# Patient Record
Sex: Female | Born: 1945
Health system: Southern US, Community
[De-identification: ages and names within clinical notes are randomized; demographics above are authoritative.]

## PROBLEM LIST (undated history)

## (undated) DIAGNOSIS — Z86010 Personal history of colonic polyps: Secondary | ICD-10-CM

## (undated) DIAGNOSIS — Z87442 Personal history of urinary calculi: Secondary | ICD-10-CM

## (undated) DIAGNOSIS — E785 Hyperlipidemia, unspecified: Secondary | ICD-10-CM

## (undated) HISTORY — DX: Personal history of colonic polyps: Z86.010

## (undated) HISTORY — DX: Hyperlipidemia, unspecified: E78.5

## (undated) HISTORY — DX: Personal history of urinary calculi: Z87.442

## (undated) HISTORY — PX: CATARACT EXTRACTION, BILATERAL: SHX1313

---

## 2003-08-11 ENCOUNTER — Other Ambulatory Visit: Admission: RE | Admit: 2003-08-11 | Discharge: 2003-08-11 | Payer: Self-pay | Admitting: Obstetrics & Gynecology

## 2007-04-05 ENCOUNTER — Encounter: Payer: Self-pay | Admitting: Internal Medicine

## 2007-05-31 ENCOUNTER — Ambulatory Visit: Payer: Self-pay | Admitting: Internal Medicine

## 2007-05-31 DIAGNOSIS — E785 Hyperlipidemia, unspecified: Secondary | ICD-10-CM

## 2007-05-31 DIAGNOSIS — Z87442 Personal history of urinary calculi: Secondary | ICD-10-CM

## 2007-05-31 DIAGNOSIS — M8580 Other specified disorders of bone density and structure, unspecified site: Secondary | ICD-10-CM

## 2007-05-31 HISTORY — DX: Personal history of urinary calculi: Z87.442

## 2007-05-31 HISTORY — DX: Hyperlipidemia, unspecified: E78.5

## 2007-05-31 LAB — CONVERTED CEMR LAB: Vit D, 1,25-Dihydroxy: 45 (ref 20–57)

## 2007-06-02 LAB — CONVERTED CEMR LAB: CRP, High Sensitivity: <1 — ABNORMAL LOW (ref 0.00–5.00)

## 2007-06-07 ENCOUNTER — Encounter: Payer: Self-pay | Admitting: Internal Medicine

## 2007-06-16 ENCOUNTER — Ambulatory Visit: Payer: Self-pay | Admitting: Internal Medicine

## 2007-06-30 ENCOUNTER — Encounter: Payer: Self-pay | Admitting: Internal Medicine

## 2007-06-30 ENCOUNTER — Ambulatory Visit: Payer: Self-pay | Admitting: Internal Medicine

## 2007-06-30 ENCOUNTER — Other Ambulatory Visit: Admission: RE | Admit: 2007-06-30 | Discharge: 2007-06-30 | Payer: Self-pay | Admitting: Internal Medicine

## 2007-06-30 DIAGNOSIS — G4700 Insomnia, unspecified: Secondary | ICD-10-CM

## 2008-05-22 ENCOUNTER — Encounter: Payer: Self-pay | Admitting: Internal Medicine

## 2008-09-18 ENCOUNTER — Ambulatory Visit: Payer: Self-pay | Admitting: Internal Medicine

## 2008-09-18 LAB — CONVERTED CEMR LAB
ALT: 15 units/L (ref 0–35)
Alkaline Phosphatase: 73 units/L (ref 39–117)
BUN: 15 mg/dL (ref 6–23)
Basophils Relative: 0.3 % (ref 0.0–3.0)
Bilirubin, Direct: 0.1 mg/dL (ref 0.0–0.3)
CO2: 30 meq/L (ref 19–32)
Calcium: 9.6 mg/dL (ref 8.4–10.5)
Eosinophils Relative: 1.7 % (ref 0.0–5.0)
Glucose, Bld: 93 mg/dL (ref 70–99)
Hemoglobin: 13.3 g/dL (ref 12.0–15.0)
Lymphocytes Relative: 31.2 % (ref 12.0–46.0)
Monocytes Relative: 12.1 % — ABNORMAL HIGH (ref 3.0–12.0)
Neutro Abs: 2 10*3/uL (ref 1.4–7.7)
Nitrite: NEGATIVE
RBC: 4.2 M/uL (ref 3.87–5.11)
RDW: 12.1 % (ref 11.5–14.6)
Sodium: 139 meq/L (ref 135–145)
TSH: 1.81 microintl units/mL (ref 0.35–5.50)
Total CHOL/HDL Ratio: 3.5
Total Protein: 7.2 g/dL (ref 6.0–8.3)
Urobilinogen, UA: 1
VLDL: 14 mg/dL (ref 0–40)
WBC Urine, dipstick: NEGATIVE
WBC: 3.7 10*3/uL — ABNORMAL LOW (ref 4.5–10.5)

## 2008-09-25 ENCOUNTER — Ambulatory Visit: Payer: Self-pay | Admitting: Internal Medicine

## 2009-10-12 ENCOUNTER — Ambulatory Visit: Payer: Self-pay | Admitting: Internal Medicine

## 2009-10-12 ENCOUNTER — Other Ambulatory Visit: Admission: RE | Admit: 2009-10-12 | Discharge: 2009-10-12 | Payer: Self-pay | Admitting: Internal Medicine

## 2009-10-12 DIAGNOSIS — R05 Cough: Secondary | ICD-10-CM

## 2009-10-12 DIAGNOSIS — Z860100 Personal history of colon polyps, unspecified: Secondary | ICD-10-CM

## 2009-10-12 HISTORY — DX: Personal history of colonic polyps: Z86.010

## 2009-10-12 HISTORY — DX: Personal history of colon polyps, unspecified: Z86.0100

## 2009-10-15 ENCOUNTER — Encounter: Payer: Self-pay | Admitting: Internal Medicine

## 2010-08-14 ENCOUNTER — Ambulatory Visit: Payer: Self-pay | Admitting: Internal Medicine

## 2010-10-06 LAB — CONVERTED CEMR LAB
ALT: 19 units/L (ref 0–35)
AST: 25 units/L (ref 0–37)
Albumin: 4.2 g/dL (ref 3.5–5.2)
Basophils Absolute: 0 10*3/uL (ref 0.0–0.1)
Calcium: 9.2 mg/dL (ref 8.4–10.5)
Cholesterol: 221 mg/dL — ABNORMAL HIGH (ref 0–200)
Creatinine, Ser: 0.7 mg/dL (ref 0.4–1.2)
Eosinophils Absolute: 0 10*3/uL (ref 0.0–0.7)
HCT: 38.8 % (ref 36.0–46.0)
Hemoglobin: 12.7 g/dL (ref 12.0–15.0)
Lymphs Abs: 0.9 10*3/uL (ref 0.7–4.0)
MCHC: 32.7 g/dL (ref 30.0–36.0)
Neutro Abs: 3.4 10*3/uL (ref 1.4–7.7)
RDW: 11.8 % (ref 11.5–14.6)
TSH: 1.23 microintl units/mL (ref 0.35–5.50)
Triglycerides: 48 mg/dL (ref 0.0–149.0)

## 2010-10-08 NOTE — Assessment & Plan Note (Signed)
Summary: flu shot/cjr  Nurse Visit   Allergies: 1)  ! Sulfa  Orders Added: 1)  Flu Vaccine 28yrs + MEDICARE PATIENTS [Q2039] 2)  Administration Flu vaccine - MCR [G0008]    Flu Vaccine Consent Questions     Do you have a history of severe allergic reactions to this vaccine? no    Any prior history of allergic reactions to egg and/or gelatin? no    Do you have a sensitivity to the preservative Thimersol? no    Do you have a past history of Guillan-Barre Syndrome? no    Do you currently have an acute febrile illness? no    Have you ever had a severe reaction to latex? no    Vaccine information given and explained to patient? yes    Are you currently pregnant? no    Lot Number:[id number]   Exp Date:03/08/2011   Site Given  Left Deltoid IM Romualdo Bolk, CMA (AAMA)  August 14, 2010 9:05 AM

## 2010-10-08 NOTE — Assessment & Plan Note (Signed)
Summary: emp/pt fasting/cjr   Vital Signs:  Patient profile:   65 year old female Menstrual status:  postmenopausal Height:      65 inches Weight:      129 pounds BMI:     21.54 Pulse rate:   66 / minute BP sitting:   110 / 70  (right arm) Cuff size:   regular  Vitals Entered By: Romualdo Bolk, CMA (AAMA) (October 12, 2009 8:55 AM)  Contraindications/Deferment of Procedures/Staging:    Test/Procedure: FLU VAX    Reason for deferment: patient declined flu shot not available  and had declined earlier .,  CC: Annual visit for disease management- discuss doing a pap Menarche (age onset years): 11    Menstrual Status postmenopausal Last PAP Result normal   History of Present Illness: Nichole Berg comes in today for welcome to medicare check up.   Since last visit  here  there have been no major changes in health status  .  She currently has a cough  into the second week without fever and now loose taking ucinex. No cp or sob. grandchildren are young and exposing.    laryngitis at x mas and  improved. and then   had another uri  but week for  1 week.    No fever or sob.     looser  now.        Due for cataract   removeal   right for Feb 22.   Sleep  : rare use of ambien.   LIPIDS: on no meds or supp for this now  . got se of higher dose  oRYR. not taking crestor.  No new change in fam hx.  no cv pulm signs  Disc immuniz    Preventive Care Screening  Pap Smear:    Date:  06/30/2007    Results:  normal   Mammogram:    Date:  05/23/2007    Results:  normal   Prior Values:    Colonoscopy:  Polyps-benign (09/08/2004)    Last Tetanus Booster:  Historical (09/09/2003)   Preventive Screening-Counseling & Management  Alcohol-Tobacco     Alcohol drinks/day: 1     Alcohol type: Beer or Wine     Smoking Status: never     Year Quit: ages 23 and 48  Caffeine-Diet-Exercise     Caffeine use/day: 0     Does Patient Exercise: yes     Type of exercise: [employer], yoga, golf,  walking     Times/week: 7  Hep-HIV-STD-Contraception     Dental Visit-last 6 months yes     Sun Exposure-Excessive: no  Safety-Violence-Falls     Seat Belt Use: 100     Firearms in the Home: no firearms in the home     Smoke Detectors: yes     Fall Risk: normal       Blood Transfusions:  no.    Current Medications (verified): 1)  Multi-Vitamin   Tabs (Multiple Vitamin) 2)  Calcium 500/d 500-200 Mg-Unit  Tabs (Calcium Carbonate-Vitamin D) 3)  Fish Oil   Oil (Fish Oil) 4)  Ambien 10 Mg  Tabs (Zolpidem Tartrate) .Marland Kitchen.. 1po Hs As Needed 5)  Vitamin D 2000 Unit Tabs (Cholecalciferol) 6)  Astragalus Root  Powd (Tragacanth) 7)  Culturelle 10 B Cell Caps (Lactobacillus Rhamnosus (Gg))  Allergies (verified): 1)  ! Sulfa  Past History:  Past medical, surgical, family and social histories (including risk factors) reviewed, and no changes noted (except as noted below).  Past Medical History: elevated cholesterol anemia premenopausal. Nephrolithiasis, hx of  in her 33s Catacters G2 P2 colonoscopy with small polyps 2005 Dr. Kinnie Scales        LAST Mammogram: 6/09 Pap: 2009 Td: 2005   Colonscopy: 2005   [facility]. EKG: 2005 Dexa: 2006 Eye Exam: 2008   Other: Smoking: Never  Consults: None  Past Surgical History: Reviewed history from 05/31/2007 and no changes required. Kidney Stone removed 1969  Past History:  Care Management: Gastroenterology: [facility] Ophthalmology: [facility]  Family History: Reviewed history from 09/25/2008 and no changes required. Family History of CAD Female 1st degree rel   father had a stent in his 6's.  herHiparents are 69 and 62 Father: Stent, spinal stenosis, J-Tube Mother: CHF, HBP, back pain Siblings: 1 Brother and 1 sister HBP    Social History: Reviewed history from 09/25/2008 and no changes required. 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    Caffeine use/day:  0 Dental Care w/in 6 mos.:  yes Sun Exposure-Excessive:  no Blood Transfusions:  no Fall Risk:  normal   Review of Systems  The patient denies anorexia, fever, weight loss, weight gain, vision loss, decreased hearing, hoarseness, chest pain, syncope, dyspnea on exertion, peripheral edema, prolonged cough, headaches, hemoptysis, abdominal pain, melena, hematochezia, severe indigestion/heartburn, hematuria, incontinence, genital sores, muscle weakness, suspicious skin lesions, transient blindness, difficulty walking, depression, unusual weight change, abnormal bleeding, enlarged lymph nodes, angioedema, and breast masses.   Physical Exam General Appearance: well developed, well nourished, no acute distress Eyes: conjunctiva and lids normal, PERRLA, EOMI,  WNL Ears, Nose, Mouth, Throat: TM clear, nares clear, oral exam WNL Neck: supple, no lymphadenopathy, no thyromegaly, no JVD Respiratory: clear to auscultation and percussion, respiratory effort normal Cardiovascular: regular rate and rhythm, S1-S2, no murmur, rub or gallop, no bruits, peripheral pulses normal and symmetric, no cyanosis, clubbing, edema or varicosities Chest: no scars, masses, tenderness; no asymmetry, skin changes, nipple discharge   Gastrointestinal: soft, non-tender; no hepatosplenomegaly, masses; active bowel sounds all quadrants, guaiac negative stool; no masses, tenderness,  slight tag of hemorrhoids  Genitourinary: no vaginal discharge, lesions; no masses or tenderness  PAP done  Lymphatic: no cervical, axillary or inguinal adenopathy Musculoskeletal: gait normal, muscle tone and strength WNL, no joint swelling, effusions, discoloration, crepitus  Skin: clear, good turgor, color WNL, no rashes, lesions, or ulcerations  but does have  sun changes  Neurologic: normal mental status, normal reflexes, normal strength, sensation, and motion Psychiatric: alert; oriented to person, place and time Other  Exam:  EKG NSR  rate 54      Impression & Recommendations:  Problem # 1:  PREVENTIVE HEALTH CARE (ICD-V70.0) Discussed nutrition,exercise,diet,healthy weight, vitamin D and calcium.  continue healthy lifestyle intervention  rec tdap with exposure to grand children and pneumovax for age.    disc zostavax and will wait on thisfor now Orders: EKG w/ Interpretation (93000) Venipuncture (13086)  Problem # 2:  GYNECOLOGICAL EXAMINATION, ROUTINE (ICD-V72.31) pap done  Orders: Obtaining Screening PAP Smear (V7846) Pelvic & Breast Exam ( Medicare)  (N6295)  Problem # 3:  COUGH (ICD-786.2) probably viral  cause  .  call if persistent and progressive   Orders: TLB-CBC Platelet - w/Differential (85025-CBCD) Venipuncture (28413)  Problem # 4:  HYPERLIPIDEMIA (ICD-272.4) se of statin meds?      also  risk is not that high except for age  and levels  had improved  in the past her crp was ok.  she does have a family hc of vascular disease but  in later ages  68s.    disc optinos and she can look at reynolds rsik score when avaialble.  if no intervention then not worth doing the  subparticle analysis at this time. The following medications were removed from the medication list:    Crestor 5 Mg Tabs (Rosuvastatin calcium)  Orders: TLB-BMP (Basic Metabolic Panel-BMET) (80048-METABOL) TLB-Lipid Panel (80061-LIPID) TLB-Hepatic/Liver Function Pnl (80076-HEPATIC) TLB-TSH (Thyroid Stimulating Hormone) (84443-TSH) EKG w/ Interpretation (93000)  Problem # 5:  OSTEOPENIA (ICD-733.90)  Her updated medication list for this problem includes:    Calcium 500/d 500-200 Mg-unit Tabs (Calcium carbonate-vitamin d)    Vitamin D 2000 Unit Tabs (Cholecalciferol)  Orders: TLB-BMP (Basic Metabolic Panel-BMET) (80048-METABOL) TLB-Lipid Panel (80061-LIPID) TLB-Hepatic/Liver Function Pnl (80076-HEPATIC) TLB-TSH (Thyroid Stimulating Hormone) (84443-TSH) Venipuncture (09811)  Problem # 6:  SYMPTOM, INSOMNIA NOS  (ICD-780.52) Assessment: Unchanged stable ocass use of med  Her updated medication list for this problem includes:    Ambien 10 Mg Tabs (Zolpidem tartrate) .Marland Kitchen... 1po hs as needed  Problem # 7:  hx of colony polyp stool cards and follow up with dr Kinnie Scales as he recs.   Complete Medication List: 1)  Multi-vitamin Tabs (Multiple vitamin) 2)  Calcium 500/d 500-200 Mg-unit Tabs (Calcium carbonate-vitamin d) 3)  Fish Oil Oil (Fish oil) 4)  Ambien 10 Mg Tabs (Zolpidem tartrate) .Marland Kitchen.. 1po hs as needed 5)  Vitamin D 2000 Unit Tabs (Cholecalciferol) 6)  Astragalus Root Powd (Tragacanth) 7)  Culturelle 10 B Cell Caps (Lactobacillus rhamnosus (gg))  Other Orders: Pneumococcal Vaccine (91478) Admin 1st Vaccine (29562) Tdap => 9yrs IM (13086) Admin of Any Addtl Vaccine (57846)  Patient Instructions: 1)  You will be informed of lab results when available.  2)  continue lifestyle intervention  and rov in 1 year or if needed. 3)  Stool hemoccults .  Prescriptions: AMBIEN 10 MG  TABS (ZOLPIDEM TARTRATE) 1po hs as needed  #20 x 1   Entered and Authorized by:   Madelin Headings MD   Signed by:   Madelin Headings MD on 10/12/2009   Method used:   Print then Give to Patient   RxID:   [id number]    Immunizations Administered:  Pneumonia Vaccine:    Vaccine Type: Pneumovax (Medicare)    Site: right deltoid    Mfr: Merck    Dose: 0.5 ml    Route: IM    Given by: Romualdo Bolk, CMA (AAMA)    Exp. Date: 12/27/2010    Lot #: [id number]  Tetanus Vaccine:    Vaccine Type: Tdap    Site: left deltoid    Mfr: GlaxoSmithKline    Dose: 0.5 ml    Route: IM    Given by: Romualdo Bolk, CMA (AAMA)    Exp. Date: 11/03/2011    Lot #: [id number]

## 2011-01-20 ENCOUNTER — Ambulatory Visit: Payer: Self-pay | Admitting: Internal Medicine

## 2011-01-20 DIAGNOSIS — Z0289 Encounter for other administrative examinations: Secondary | ICD-10-CM

## 2011-01-21 ENCOUNTER — Telehealth: Payer: Self-pay | Admitting: Internal Medicine

## 2011-01-21 NOTE — Telephone Encounter (Signed)
Pt read on internet that after receiving the shingles vaccine, there is a period of time to avoid children who have not had chicken pox or the vaccine, and anyone that hasn't had chicken pox or vaccine and immune compromised individuals.  It did not say how long, and she has grandchildren that she sees weekly.  How long would Dr. Fabian Sharp recommend avoiding them?

## 2011-01-21 NOTE — Telephone Encounter (Signed)
Pt called and has questions re: shinges vaccine and being around children that haven't had chicken pox or the vaccine? Pt was suppose to have vax on 01/20/11, but missed appt and would like to know how soon after rcvng vax, she could be around children?

## 2011-01-23 ENCOUNTER — Telehealth: Payer: Self-pay | Admitting: *Deleted

## 2011-01-23 NOTE — Telephone Encounter (Signed)
Pt given recommendation from Dr. Fabian Sharp re :  Exposure after shingles vaccine.

## 2011-01-23 NOTE — Telephone Encounter (Signed)
LMTCB

## 2011-01-23 NOTE — Telephone Encounter (Signed)
There is no recommendation to avoid contact with others after vaccine. Transmission would be very rare and associated with those people getting the side effect of a rash from the vaccine (which is also pretty uncommon.)

## 2011-01-23 NOTE — Telephone Encounter (Signed)
Pt was given recommendations from Dr. Fabian Sharp re: shingles vaccine question.

## 2011-05-09 ENCOUNTER — Encounter: Payer: Self-pay | Admitting: Internal Medicine

## 2011-05-09 ENCOUNTER — Ambulatory Visit (INDEPENDENT_AMBULATORY_CARE_PROVIDER_SITE_OTHER): Payer: BC Managed Care – PPO | Admitting: Internal Medicine

## 2011-05-09 VITALS — BP 120/80 | HR 60 | Temp 98.7°F | Wt 136.0 lb

## 2011-05-09 DIAGNOSIS — J069 Acute upper respiratory infection, unspecified: Secondary | ICD-10-CM

## 2011-05-09 DIAGNOSIS — H6690 Otitis media, unspecified, unspecified ear: Secondary | ICD-10-CM

## 2011-05-09 DIAGNOSIS — H6691 Otitis media, unspecified, right ear: Secondary | ICD-10-CM | POA: Insufficient documentation

## 2011-05-09 MED ORDER — AMOXICILLIN 500 MG PO CAPS: 500.0000 mg | ORAL_CAPSULE | Freq: Three times a day (TID) | ORAL | Status: AC

## 2011-05-09 NOTE — Progress Notes (Signed)
  Subjective:    Patient ID: Nichole Berg, female    DOB: 03-Apr-1946, 65 y.o.   MRN: [id number]  HPI Come sin for acute problem   Onset: after week of laryngitis and then onset aching  Right ear for 4 days  No fever   [place] Peers getting better   Better with advil  Intensity: achy off an on  Pattern waxing and waning  No fever    Treatment tried ibuprofen.  No other.  Taking care of 2 yer old grandchild had ear infection  Review of Systems Mild cough no fever  No hx of Om or sinus pain.  No gi  gu issues   Past history family history social history reviewed in the electronic medical record.     Objective:   Physical Exam  WDWN in nad  Mild ly hoarse . HEENT: Normocephalic ;atraumatic , Eyes;  PERRL, EOMs  Full, lids and conjunctiva clear,,Ears: no deformities, canals nl, TM landmarks normal left  Right  Pink and full dusky tm right non bulging., Nose: no deformity or discharge  Mouth : OP clear without lesion or edema . Chest:  Clear to A&P without wheezes rales or rhonchi CV:  S1-S2 no gallops or murmurs peripheral perfusion is normal Skin turgor .  Nl no rashes    Assessment & Plan:  Right otitis media subacute with URI   Treatment options discussed. Can add 5 day  Antibiotic if appropriate.   amox

## 2011-05-09 NOTE — Patient Instructions (Signed)
Can add antibiotic if not getting better . Call if get fever or severe pain.

## 2011-05-14 ENCOUNTER — Telehealth: Payer: Self-pay | Admitting: Internal Medicine

## 2011-05-14 MED ORDER — ZOSTER VACCINE LIVE 19400 UNT/0.65ML ~~LOC~~ SOLR: 0.6500 mL | Freq: Once | SUBCUTANEOUS | Status: DC

## 2011-05-14 NOTE — Telephone Encounter (Signed)
Rx sent to pharmacy   

## 2011-05-14 NOTE — Telephone Encounter (Signed)
Nichole Berg, pt would like to get the Zostavax at [facility]. Pt wants to know if you can call the Rx in to the one on [address]. Please call pt to confirm.

## 2011-09-19 ENCOUNTER — Encounter: Payer: Self-pay | Admitting: Internal Medicine

## 2011-10-13 ENCOUNTER — Encounter: Payer: Self-pay | Admitting: Internal Medicine

## 2011-10-13 ENCOUNTER — Ambulatory Visit (INDEPENDENT_AMBULATORY_CARE_PROVIDER_SITE_OTHER): Payer: BC Managed Care – PPO | Admitting: Internal Medicine

## 2011-10-13 VITALS — BP 124/74 | HR 70 | Temp 97.6°F | Ht 64.5 in | Wt 134.0 lb

## 2011-10-13 DIAGNOSIS — Z0000 Encounter for general adult medical examination without abnormal findings: Secondary | ICD-10-CM | POA: Insufficient documentation

## 2011-10-13 DIAGNOSIS — E785 Hyperlipidemia, unspecified: Secondary | ICD-10-CM

## 2011-10-13 DIAGNOSIS — Z87442 Personal history of urinary calculi: Secondary | ICD-10-CM

## 2011-10-13 DIAGNOSIS — G4700 Insomnia, unspecified: Secondary | ICD-10-CM

## 2011-10-13 DIAGNOSIS — M949 Disorder of cartilage, unspecified: Secondary | ICD-10-CM

## 2011-10-13 DIAGNOSIS — Z86010 Personal history of colonic polyps: Secondary | ICD-10-CM

## 2011-10-13 DIAGNOSIS — M899 Disorder of bone, unspecified: Secondary | ICD-10-CM

## 2011-10-13 DIAGNOSIS — H269 Unspecified cataract: Secondary | ICD-10-CM

## 2011-10-13 LAB — CBC WITH DIFFERENTIAL/PLATELET
Basophils Absolute: 0 10*3/uL (ref 0.0–0.1)
Eosinophils Absolute: 0 10*3/uL (ref 0.0–0.7)
Lymphocytes Relative: 24.2 % (ref 12.0–46.0)
MCHC: 33.9 g/dL (ref 30.0–36.0)
Monocytes Relative: 8.5 % (ref 3.0–12.0)
Neutrophils Relative %: 66 % (ref 43.0–77.0)
Platelets: 217 10*3/uL (ref 150.0–400.0)
RDW: 13.6 % (ref 11.5–14.6)

## 2011-10-13 LAB — HEPATIC FUNCTION PANEL
AST: 26 U/L (ref 0–37)
Albumin: 4.4 g/dL (ref 3.5–5.2)
Alkaline Phosphatase: 83 U/L (ref 39–117)
Bilirubin, Direct: 0 mg/dL (ref 0.0–0.3)
Total Bilirubin: 1 mg/dL (ref 0.3–1.2)

## 2011-10-13 LAB — BASIC METABOLIC PANEL
CO2: 27 mEq/L (ref 19–32)
Calcium: 9.2 mg/dL (ref 8.4–10.5)
Creatinine, Ser: 0.7 mg/dL (ref 0.4–1.2)
GFR: 96.36 mL/min (ref >60.00)
Glucose, Bld: 91 mg/dL (ref 70–99)
Sodium: 143 mEq/L (ref 135–145)

## 2011-10-13 LAB — TSH: TSH: 1.48 u[IU]/mL (ref 0.35–5.50)

## 2011-10-13 LAB — LIPID PANEL
HDL: 78.8 mg/dL (ref >39.00)
Total CHOL/HDL Ratio: 3
VLDL: 11.8 mg/dL (ref 0.0–40.0)

## 2011-10-13 MED ORDER — ZOLPIDEM TARTRATE 5 MG PO TABS: 5.0000 mg | ORAL_TABLET | Freq: Every evening | ORAL | Status: DC | PRN

## 2011-10-13 NOTE — Patient Instructions (Signed)
Continue lifestyle intervention healthy eating and exercise . Will notify you  of labs when available. Wellness visit in a year or as needed. When due.

## 2011-10-13 NOTE — Progress Notes (Signed)
Subjective:    Patient ID: Nichole Berg, female    DOB: 1946-07-07, 66 y.o.   MRN: [id number]  HPI Patient comes in today for preventive visit and follow-up of medical issues. Update of her history since her last visit. No major changes  .  Sees eye doc regularly cataract following  Sleep:   Uses ambien 1/2 once a month and so.  Cataract surgery 2 years ago    Hearing: Good   Vision:  No limitations at present . One cataract wacthing   Safety:  Has smoke detector and wears seat belts.  No firearms. No excess sun exposure. Sees dentist regularly.  Falls:  No  Advance directive :  Reviewed  Has one.  Memory: Felt to be good  , no concern from her or her family.  Depression: No anhedonia unusual crying or depressive symptoms  Nutrition: Eats well balanced diet; adequate calcium and vitamin D. No swallowing chewiing problems.  Injury: no major injuries in the last six months.  Other healthcare providers:  Reviewed today .  Social:  HH  of 1  One cat.   Preventive parameters: up-to-date on colonoscopy, due soon.  mammogram, immunizations. Including Tdap and pneumovax.  ADLS:   There are no problems or need for assistance  driving, feeding, obtaining food, dressing, toileting and bathing, managing money using phone. She is independent. Works as Geophysical data processor on weekends   Works   Does reg exercise  4-5 x per week yoga and walking and weights no sig limitations    Review of Systems ROS:  GEN/ HEENT: No fever, significant weight changes sweats headaches vision problems hearing changes, CV/ PULM; No chest pain shortness of breath cough, syncope,edema  change in exercise tolerance. GI /GU: No adominal pain, vomiting, change in bowel habits. No blood in the stool. No significant GU symptoms. SKIN/HEME: ,no acute skin rashes suspicious lesions or bleeding. No lymphadenopathy, nodules, masses.  NEURO/ PSYCH:  No neurologic signs such as weakness numbness. No depression  anxiety. IMM/ Allergy: No unusual infections.  Allergy .   no REST of 12 system review negative except as per HPI  Past history family history social history reviewed in the electronic medical record.      Objective:   Physical Exam Physical Exam: Vital signs reviewed WUJ:WJXB is a well-developed well-nourished alert cooperative  white female who appears her stated age in no acute distress.  HEENT: normocephalic atraumatic , Eyes: PERRL EOM's full, conjunctiva clear, Nares: paten,t no deformity discharge or tenderness., Ears: no deformity EAC's clear TMs with normal landmarks. Mouth: clear OP, no lesions, edema.  Moist mucous membranes. Dentition in adequate repair. NECK: supple without masses, thyromegaly or bruits. CHEST/PULM:  Clear to auscultation and percussion breath sounds equal no wheeze , rales or rhonchi. No chest wall deformities or tenderness. CV: PMI is nondisplaced, S1 S2 no gallops, murmurs, rubs. Peripheral pulses are full without delay.No JVD .  ABDOMEN: Bowel sounds normal nontender  No guard or rebound, no hepato splenomegal no CVA tenderness.  No hernia. Extremtities:  No clubbing cyanosis or edema, no acute joint swelling or redness no focal atrophy NEURO:  Oriented x3, cranial nerves 3-12 appear to be intact, no obvious focal weakness,gait within normal limits no abnormal reflexes or asymmetrical SKIN: No acute rashes normal turgor, color, no bruising or petechiae. Sun changes and freckling  PSYCH: Oriented, good eye contact, no obvious depression anxiety, cognition and judgment appear normal. LN: no cervical axillary inguinal adenopathy Pelvic: NL ext GU,  labia clear without lesions or rash . Vagina no lesions .Cervix: clear  UTERUS: Neg CMT Adnexa:  clear no masses . PAP NOT done Rectal neg masses stool heme negattive   Lab Results  Component Value Date   WBC 5.0 10/13/2011   HGB 12.9 10/13/2011   HCT 38.0 10/13/2011   PLT 217.0 10/13/2011   GLUCOSE 91 10/13/2011   CHOL  251* 10/13/2011   TRIG 59.0 10/13/2011   HDL 78.80 10/13/2011   LDLDIRECT 160.3 10/13/2011   ALT 17 10/13/2011   AST 26 10/13/2011   NA 143 10/13/2011   K 4.7 10/13/2011   CL 105 10/13/2011   CREATININE 0.7 10/13/2011   BUN 18 10/13/2011   CO2 27 10/13/2011   TSH 1.48 10/13/2011         Assessment & Plan:  Preventive Health Care Counseled regarding healthy nutrition, exercise, sleep, injury prevention, calcium vit d and healthy weight .  utd    Healthy   Labs today Check on colonscopy. when due  Sleep  intermittent issue    Risk benefit of medication discussed. And is aware  LIPIDS  lsi   Prefer now meds  OSTEOPENIA does ca vit d exercise regularly .  Medicare Attestation I have personally reviewed: The patient's medical and social history Their use of alcohol, tobacco or illicit drugs Their current medications and supplements The patient's functional ability including ADLs,fall risks, home safety risks, cognitive, and hearing and visual impairment Diet and physical activities Evidence for depression or mood disorders  The patient's weight, height, BMI, and visual acuity have been recorded in the chart.  I have made referrals, counseling, and provided education to the patient based on review of the above and I have provided the patient with a written personalized care plan for preventive services.

## 2011-10-14 ENCOUNTER — Encounter: Payer: Self-pay | Admitting: *Deleted

## 2011-10-14 NOTE — Progress Notes (Addendum)
Quick Note:  Mailed letter to pt ______ 

## 2011-10-18 ENCOUNTER — Encounter: Payer: Self-pay | Admitting: Internal Medicine

## 2011-10-18 DIAGNOSIS — H269 Unspecified cataract: Secondary | ICD-10-CM | POA: Insufficient documentation

## 2011-12-22 LAB — HM COLONOSCOPY

## 2012-11-08 LAB — HM MAMMOGRAPHY

## 2012-11-09 ENCOUNTER — Encounter: Payer: Self-pay | Admitting: Internal Medicine

## 2014-02-01 ENCOUNTER — Ambulatory Visit: Payer: Medicare Other | Attending: Orthopedic Surgery | Admitting: Physical Therapy

## 2014-02-01 DIAGNOSIS — R5381 Other malaise: Secondary | ICD-10-CM | POA: Insufficient documentation

## 2014-02-01 DIAGNOSIS — M25519 Pain in unspecified shoulder: Secondary | ICD-10-CM | POA: Insufficient documentation

## 2014-02-01 DIAGNOSIS — M25619 Stiffness of unspecified shoulder, not elsewhere classified: Secondary | ICD-10-CM | POA: Insufficient documentation

## 2014-02-01 DIAGNOSIS — M6281 Muscle weakness (generalized): Secondary | ICD-10-CM | POA: Insufficient documentation

## 2014-02-01 DIAGNOSIS — IMO0001 Reserved for inherently not codable concepts without codable children: Secondary | ICD-10-CM | POA: Insufficient documentation

## 2014-02-06 ENCOUNTER — Ambulatory Visit: Payer: Medicare Other | Attending: Orthopedic Surgery | Admitting: Physical Therapy

## 2014-02-06 DIAGNOSIS — M25519 Pain in unspecified shoulder: Secondary | ICD-10-CM | POA: Diagnosis not present

## 2014-02-06 DIAGNOSIS — M25619 Stiffness of unspecified shoulder, not elsewhere classified: Secondary | ICD-10-CM | POA: Insufficient documentation

## 2014-02-06 DIAGNOSIS — R5381 Other malaise: Secondary | ICD-10-CM | POA: Diagnosis not present

## 2014-02-06 DIAGNOSIS — IMO0001 Reserved for inherently not codable concepts without codable children: Secondary | ICD-10-CM | POA: Diagnosis present

## 2014-02-06 DIAGNOSIS — M6281 Muscle weakness (generalized): Secondary | ICD-10-CM | POA: Diagnosis not present

## 2014-02-08 ENCOUNTER — Ambulatory Visit: Payer: Medicare Other | Admitting: Physical Therapy

## 2014-02-08 DIAGNOSIS — IMO0001 Reserved for inherently not codable concepts without codable children: Secondary | ICD-10-CM | POA: Diagnosis not present

## 2014-02-13 ENCOUNTER — Ambulatory Visit: Payer: Medicare Other | Admitting: Physical Therapy

## 2014-02-13 DIAGNOSIS — IMO0001 Reserved for inherently not codable concepts without codable children: Secondary | ICD-10-CM | POA: Diagnosis not present

## 2014-02-15 ENCOUNTER — Ambulatory Visit: Payer: Medicare Other | Admitting: Physical Therapy

## 2014-02-15 DIAGNOSIS — IMO0001 Reserved for inherently not codable concepts without codable children: Secondary | ICD-10-CM | POA: Diagnosis not present

## 2014-02-20 ENCOUNTER — Encounter: Payer: Medicare Other | Admitting: Physical Therapy

## 2014-03-06 ENCOUNTER — Ambulatory Visit: Payer: Medicare Other | Admitting: Physical Therapy

## 2014-03-06 DIAGNOSIS — IMO0001 Reserved for inherently not codable concepts without codable children: Secondary | ICD-10-CM | POA: Diagnosis not present

## 2014-03-08 ENCOUNTER — Ambulatory Visit: Payer: Medicare Other | Attending: Orthopedic Surgery | Admitting: Physical Therapy

## 2014-03-08 DIAGNOSIS — M25619 Stiffness of unspecified shoulder, not elsewhere classified: Secondary | ICD-10-CM | POA: Diagnosis not present

## 2014-03-08 DIAGNOSIS — M25519 Pain in unspecified shoulder: Secondary | ICD-10-CM | POA: Diagnosis not present

## 2014-03-08 DIAGNOSIS — IMO0001 Reserved for inherently not codable concepts without codable children: Secondary | ICD-10-CM | POA: Diagnosis not present

## 2014-03-08 DIAGNOSIS — M6281 Muscle weakness (generalized): Secondary | ICD-10-CM | POA: Insufficient documentation

## 2014-03-08 DIAGNOSIS — R5381 Other malaise: Secondary | ICD-10-CM | POA: Insufficient documentation

## 2014-03-13 ENCOUNTER — Ambulatory Visit: Payer: Medicare Other

## 2014-03-13 DIAGNOSIS — IMO0001 Reserved for inherently not codable concepts without codable children: Secondary | ICD-10-CM | POA: Diagnosis not present

## 2014-03-15 ENCOUNTER — Ambulatory Visit: Payer: Medicare Other | Admitting: Physical Therapy

## 2014-06-16 ENCOUNTER — Ambulatory Visit (INDEPENDENT_AMBULATORY_CARE_PROVIDER_SITE_OTHER): Payer: Medicare Other | Admitting: Internal Medicine

## 2014-06-16 ENCOUNTER — Encounter: Payer: Self-pay | Admitting: Internal Medicine

## 2014-06-16 VITALS — BP 118/72 | Temp 98.2°F | Ht 64.0 in | Wt 137.3 lb

## 2014-06-16 DIAGNOSIS — E785 Hyperlipidemia, unspecified: Secondary | ICD-10-CM

## 2014-06-16 DIAGNOSIS — Z0000 Encounter for general adult medical examination without abnormal findings: Secondary | ICD-10-CM | POA: Insufficient documentation

## 2014-06-16 DIAGNOSIS — M899 Disorder of bone, unspecified: Secondary | ICD-10-CM

## 2014-06-16 DIAGNOSIS — Z23 Encounter for immunization: Secondary | ICD-10-CM

## 2014-06-16 DIAGNOSIS — M8580 Other specified disorders of bone density and structure, unspecified site: Secondary | ICD-10-CM

## 2014-06-16 DIAGNOSIS — R739 Hyperglycemia, unspecified: Secondary | ICD-10-CM | POA: Insufficient documentation

## 2014-06-16 LAB — BASIC METABOLIC PANEL
BUN: 11 mg/dL (ref 6–23)
CALCIUM: 9.5 mg/dL (ref 8.4–10.5)
CO2: 23 mEq/L (ref 19–32)
CREATININE: 0.6 mg/dL (ref 0.4–1.2)
Chloride: 106 mEq/L (ref 96–112)
GFR: 115.93 mL/min (ref >60.00)
GLUCOSE: 86 mg/dL (ref 70–99)
Potassium: 4.7 mEq/L (ref 3.5–5.1)
SODIUM: 141 meq/L (ref 135–145)

## 2014-06-16 LAB — HEMOGLOBIN A1C: HEMOGLOBIN A1C: 5.6 % (ref 4.6–6.5)

## 2014-06-16 LAB — LIPID PANEL
Cholesterol: 267 mg/dL — ABNORMAL HIGH (ref 0–200)
HDL: 72.4 mg/dL (ref >39.00)
LDL Cholesterol: 181 mg/dL — ABNORMAL HIGH (ref 0–99)
NONHDL: 194.6
Total CHOL/HDL Ratio: 4
Triglycerides: 68 mg/dL (ref 0.0–149.0)
VLDL: 13.6 mg/dL (ref 0.0–40.0)

## 2014-06-16 LAB — VITAMIN D 25 HYDROXY (VIT D DEFICIENCY, FRACTURES): VITD: 50 ng/mL (ref 30.00–100.00)

## 2014-06-16 NOTE — Patient Instructions (Addendum)
Continue lifestyle intervention healthy eating and exercise . Get dexa bone density to check fracture risk. prevnar 13 today  Get yearly flu vaccine   Healthy lifestyle includes : At least 150 minutes of exercise weeks  , weight at healthy levels, which is usually   BMI 19-25. Avoid trans fats and processed foods;  Increase fresh fruits and veges to 5 servings per day. And avoid sweet beverages including tea and juice. Mediterranean diet with olive oil and nuts have been noted to be heart and brain healthy . Avoid tobacco products . Limit  alcohol to  7 per week for women and 14 servings for men.  Get adequate sleep . Wear seat belts . Don't text and drive .   Bone Health Our bones do many things. They provide structure, protect organs, anchor muscles, and store calcium. Adequate calcium in your diet and weight-bearing physical activity help build strong bones, improve bone amounts, and may reduce the risk of weakening of bones (osteoporosis) later in life. PEAK BONE MASS By age 70, the average woman has acquired most of her skeletal bone mass. A large decline occurs in older adults which increases the risk of osteoporosis. In women this occurs around the time of menopause. It is important for young girls to reach their peak bone mass in order to maintain bone health throughout life. A person with high bone mass as a young adult will be more likely to have a higher bone mass later in life. Not enough calcium consumption and physical activity early on could result in a failure to achieve optimum bone mass in adulthood. OSTEOPOROSIS Osteoporosis is a disease of the bones. It is defined as low bone mass with deterioration of bone structure. Osteoporosis leads to an increase risk of fractures with falls. These fractures commonly happen in the wrist, hip, and spine. While men and women of all ages and background can develop osteoporosis, some of the risk factors for osteoporosis  are:  Female.  White.  Postmenopausal.  Older adults.  Small in body size.  Eating a diet low in calcium.  Physically inactive.  Smoking.  Use of some medications.  Family history. CALCIUM Calcium is a mineral needed by the body for healthy bones, teeth, and proper function of the heart, muscles, and nerves. The body cannot produce calcium so it must be absorbed through food. Good sources of calcium include:  Dairy products (low fat or nonfat milk, cheese, and yogurt).  Dark green leafy vegetables (bok choy and broccoli).  Calcium fortified foods (orange juice, cereal, bread, soy beverages, and tofu products).  Nuts (almonds). Recommended amounts of calcium vary for individuals. RECOMMENDED CALCIUM INTAKES Age and Amount in mg per day  Children 1 to 3 years / 700 mg  Children 4 to 8 years / 1,000 mg  Children 9 to 13 years / 1,300 mg  Teens 14 to 18 years / 1,300 mg  Adults 19 to 50 years / 1,000 mg  Adult women 51 to 70 years / 1,200 mg  Adults 71 years and older / 1,200 mg  Pregnant and breastfeeding teens / 1,300 mg  Pregnant and breastfeeding adults / 1,000 mg Vitamin D also plays an important role in healthy bone development. Vitamin D helps in the absorption of calcium. WEIGHT-BEARING PHYSICAL ACTIVITY Regular physical activity has many positive health benefits. Benefits include strong bones. Weight-bearing physical activity early in life is important in reaching peak bone mass. Weight-bearing physical activities cause muscles and bones to work  against gravity. Some examples of weight bearing physical activities include:  Walking, jogging, or running.  DIRECTVField Hockey.  Jumping rope.  Dancing.  Soccer.  Tennis or Racquetball.  Stair climbing.  Basketball.  Hiking.  Weight lifting.  Aerobic fitness classes. Including weight-bearing physical activity into an exercise plan is a great way to keep bones healthy. Adults: Engage in at least 30  minutes of moderate physical activity on most, preferably all, days of the week. Children: Engage in at least 60 minutes of moderate physical activity on most, preferably all, days of the week. FOR MORE INFORMATION [employer] Department of Agriculture, OceanographerCenter for [employer]identutrition Policy and Promotion: www.cnpp.usda.gov National Osteoporosis Foundation: RecruitSuit.cawww.nof.org Document Released: 11/15/2003 Document Revised: 12/20/2012 Document Reviewed: 02/14/2009 [facility] Patient Information 2015 [place] MillsExitCare, [place]LL[place]. This information is not intended to replace advice given to you by your health care provider. Make sure you discuss any questions you have with your health care provider.

## 2014-06-16 NOTE — Progress Notes (Signed)
Pre visit review using our clinic review tool, if applicable. No additional management support is needed unless otherwise documented below in the visit note.   Chief Complaint  Patient presents with  . Medicare Wellness    HPI: Patient comes in today for Preventive Medicare wellness visit . No major injuries, ed visits ,hospitalizations , new medications since last visit. Retired Radio broadcast assistantpractitioner .   Helps  Grand kids , physically active plays golf. No history of depression falls fractures serious illnesses did go to a health screening and was told her A1c was slightly up. No major changes hearing vision medical status. She takes supplements. Vitamin D but no calcium. Probiotics to heart Cherry extract Aleve as needed ER DSI caps. Is treating her like hyperlipidemia with lifestyle changes.  Health Maintenance  Topic Date Due  . Influenza Vaccine  04/08/2014  . Mammogram  11/09/2014  . Tetanus/tdap  10/13/2019  . Colonoscopy  12/21/2021  . Pneumococcal Polysaccharide Vaccine Age 70 And Over  Completed  . Zostavax  Addressed   Health Maintenance Review LIFESTYLE:  Exercise:  Golf  wwalking and hiking.  Tobacco/ETS:no Alcohol:   One- 2 per day Sugar beverages: no Sleep: about 6  Ok  Drug use: no Bone density: long time ago .   Mild osteopenia.  Neg fam hx  Mom is 193  Colonoscopy: 2 13  10  year recall  Dr. Kinnie ScalesMedoff Vit D 2000 iu per day    Hearing: ok  Vision:  No limitations at present . Last eye check UTD  Safety:  Has smoke detector and wears seat belts.  No firearms. No excess sun exposure. Sees dentist regularly.  Falls: no  Advance directive :  Reviewed    Memory: Felt to be good  , no concern from her or her family.  Depression: No anhedonia unusual crying or depressive symptoms  Nutrition: Eats well balanced diet; adequate calcium and vitamin D. No swallowing chewing problems.  Injury: no major injuries in the last six months.  Other healthcare providers:   Reviewed today .  Social:  Lives partenr hh of 2   3 cats 2 dogs     Preventive parameters: up-to-date  Reviewed   ADLS:   There are no problems or need for assistance  driving, feeding, obtaining food, dressing, toileting and bathing, managing money using phone. She is independent.   ROS:  GEN/ HEENT: No fever, significant weight changes sweats headaches vision problems hearing changes, CV/ PULM; No chest pain shortness of breath cough, syncope,edema  change in exercise tolerance. GI /GU: No adominal pain, vomiting, change in bowel habits. No blood in the stool. No significant GU symptoms. SKIN/HEME: ,no acute skin rashes suspicious lesions or bleeding. No lymphadenopathy, nodules, masses.  NEURO/ PSYCH:  No neurologic signs such as weakness numbness. No depression anxiety. IMM/ Allergy: No unusual infections.  Allergy .   REST of 12 system review negative except as per HPI   Past Medical History  Diagnosis Date  . COLONIC POLYPS, HX OF 10/12/2009  . NEPHROLITHIASIS, HX OF 05/31/2007  . HYPERLIPIDEMIA 05/31/2007    Family History  Problem Relation Age of Onset  . Atrial fibrillation Mother     Has pacer doing very well age 70    History   Social History  . Marital Status: Single    Spouse Name: N/A    Number of Children: N/A  . Years of Education: N/A   Social History Main Topics  . Smoking status: Former Smoker -- 2  years    Types: Cigarettes    Quit date: 09/08/1964  . Smokeless tobacco: None  . Alcohol Use: Yes  . Drug Use: No  . Sexual Activity: None   Other Topics Concern  . 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 Cholecalciferol (VITAMIN D) 2000 UNITS tablet Take 2,000 Units by mouth daily.    Marland Kitchen. lactobacillus rhamnosus, GG, (CULTURELLE) 10 B CELL capsule Take 1 capsule by mouth daily.    .  Misc Natural Products (TART CHERRY ADVANCED PO) Take 800 mg by mouth daily.  . Multiple Vitamins-Minerals (ICAPS) CAPS Take by mouth.  . [DISCONTINUED] calcium-vitamin D (OSCAL WITH D) 250-125 MG-UNIT per tablet Take 1 tablet by mouth daily.    . [DISCONTINUED] Coenzyme Q10 (CO Q 10) 100 MG CAPS Take by mouth daily.  . [DISCONTINUED] fish oil-omega-3 fatty acids 1000 MG capsule Take 2 g by mouth daily.    . [DISCONTINUED] MULTIPLE VITAMIN PO Take by mouth.    . [DISCONTINUED] zolpidem (AMBIEN) 5 MG tablet Take 1 tablet (5 mg total) by mouth at bedtime as needed for sleep.    EXAM:  BP 118/72  Temp(Src) 98.2 F (36.8 C) (Oral)  Ht 5\' 4"  (1.626 m)  Wt 137 lb 4.8 oz (62.279 kg)  BMI 23.56 kg/m2  Body mass index is 23.56 kg/(m^2).  Physical Exam: Vital signs reviewed AVW:[id number]:This is a well-developed well-nourished alert cooperative   who appears stated age in no acute distress.  HEENT: normocephalic atraumatic , Eyes: PERRL EOM's full, conjunctiva clear, Nares: paten,t no deformity discharge or tenderness., Ears: no deformity EAC's clear TMs with normal landmarks. Mouth: clear OP, no lesions, edema.  Moist mucous membranes. Dentition in adequate repair. NECK: supple without masses, thyromegaly or bruits. CHEST/PULM:  Clear to auscultation and percussion breath sounds equal no wheeze , rales or rhonchi. No chest wall deformities or tenderness. Breast: normal by inspection . No dimpling, discharge, masses, tenderness or discharge . CV: PMI is nondisplaced, S1 S2 no gallops, murmurs, rubs. Peripheral pulses are full without delay.No JVD .  ABDOMEN: Bowel sounds normal nontender  No guard or rebound, no hepato splenomegal no CVA tenderness.  No hernia. Extremtities:  No clubbing cyanosis or edema, no acute joint swelling or redness no focal atrophy NEURO:  Oriented x3, cranial nerves 3-12 appear to be intact, no obvious focal weakness,gait within normal limits no abnormal reflexes or  asymmetrical SKIN: No acute rashes normal turgor, color, no bruising or petechiae. PSYCH: Oriented, good eye contact, no obvious depression anxiety, cognition and judgment appear normal. LN: no cervical axillary inguinal adenopathy No noted deficits in memory, attention, and speech.   Lab Results  Component Value Date   WBC 5.0 10/13/2011   HGB 12.9 10/13/2011   HCT 38.0 10/13/2011   PLT 217.0 10/13/2011   GLUCOSE 86 06/16/2014   CHOL 267* 06/16/2014   TRIG 68.0 06/16/2014   HDL 72.40 06/16/2014   LDLDIRECT 160.3 10/13/2011   LDLCALC 181* 06/16/2014   ALT 17 10/13/2011   AST 26 10/13/2011   NA 141 06/16/2014   K 4.7 06/16/2014   CL 106 06/16/2014   CREATININE 0.6 06/16/2014   BUN 11 06/16/2014   CO2 23 06/16/2014   TSH 1.48 10/13/2011   HGBA1C 5.6 06/16/2014  ASSESSMENT AND PLAN:  Discussed the following assessment and plan:  Medicare annual wellness visit, subsequent - prevnar 13  reviewed HCP screenings  - Plan: Basic metabolic panel, Lipid panel, Vit D  25 hydroxy (rtn osteoporosis monitoring), Hemoglobin A1c  Hyperlipidemia - declines medication chd 10 year risk 4%/8.4 by 2013 guidelines - Plan: Basic metabolic panel, Lipid panel, Vit D  25 hydroxy (rtn osteoporosis monitoring), Hemoglobin A1c  Osteopenia - neg fam hx? get dexa rx to get [facility] with mammo check vit d todaynot interested in meds  - Plan: Basic metabolic panel, Lipid panel, Vit D  25 hydroxy (rtn osteoporosis monitoring), Hemoglobin A1c  Hyperglycemia - by report check a1c here - Plan: Basic metabolic panel, Lipid panel, Vit D  25 hydroxy (rtn osteoporosis monitoring), Hemoglobin A1c  Need for vaccination with 13-polyvalent pneumococcal conjugate vaccine - Plan: Pneumococcal conjugate vaccine 13-valent  Need for prophylactic vaccination and inoculation against influenza - Plan: Flu Vaccine QUAD 36+ mos PF IM (Fluarix Quad PF)  Patient Care Team: Madelin Headings, MD as PCP - General Griffith Citron, MD as Consulting  Physician (Gastroenterology) Richarda Overlie, MD as Consulting Physician (Ophthalmology)  Patient Instructions    Continue lifestyle intervention healthy eating and exercise . Get dexa bone density to check fracture risk. prevnar 13 today  Get yearly flu vaccine   Healthy lifestyle includes : At least 150 minutes of exercise weeks  , weight at healthy levels, which is usually   BMI 19-25. Avoid trans fats and processed foods;  Increase fresh fruits and veges to 5 servings per day. And avoid sweet beverages including tea and juice. Mediterranean diet with olive oil and nuts have been noted to be heart and brain healthy . Avoid tobacco products . Limit  alcohol to  7 per week for women and 14 servings for men.  Get adequate sleep . Wear seat belts . Don't text and drive .   Bone Health Our bones do many things. They provide structure, protect organs, anchor muscles, and store calcium. Adequate calcium in your diet and weight-bearing physical activity help build strong bones, improve bone amounts, and may reduce the risk of weakening of bones (osteoporosis) later in life. PEAK BONE MASS By age 74, the average woman has acquired most of her skeletal bone mass. A large decline occurs in older adults which increases the risk of osteoporosis. In women this occurs around the time of menopause. It is important for young girls to reach their peak bone mass in order to maintain bone health throughout life. A person with high bone mass as a young adult will be more likely to have a higher bone mass later in life. Not enough calcium consumption and physical activity early on could result in a failure to achieve optimum bone mass in adulthood. OSTEOPOROSIS Osteoporosis is a disease of the bones. It is defined as low bone mass with deterioration of bone structure. Osteoporosis leads to an increase risk of fractures with falls. These fractures commonly happen in the wrist, hip, and spine. While men  and women of all ages and background can develop osteoporosis, some of the risk factors for osteoporosis are:  Female.  White.  Postmenopausal.  Older adults.  Small in body size.  Eating a diet low in calcium.  Physically inactive.  Smoking.  Use of some medications.  Family history. CALCIUM Calcium is a mineral needed by the body for healthy bones, teeth, and proper function of the heart, muscles, and nerves.  The body cannot produce calcium so it must be absorbed through food. Good sources of calcium include:  Dairy products (low fat or nonfat milk, cheese, and yogurt).  Dark green leafy vegetables (bok choy and broccoli).  Calcium fortified foods (orange juice, cereal, bread, soy beverages, and tofu products).  Nuts (almonds). Recommended amounts of calcium vary for individuals. RECOMMENDED CALCIUM INTAKES Age and Amount in mg per day  Children 1 to 3 years / 700 mg  Children 4 to 8 years / 1,000 mg  Children 9 to 13 years / 1,300 mg  Teens 14 to 18 years / 1,300 mg  Adults 19 to 50 years / 1,000 mg  Adult women 51 to 70 years / 1,200 mg  Adults 71 years and older / 1,200 mg  Pregnant and breastfeeding teens / 1,300 mg  Pregnant and breastfeeding adults / 1,000 mg Vitamin D also plays an important role in healthy bone development. Vitamin D helps in the absorption of calcium. WEIGHT-BEARING PHYSICAL ACTIVITY Regular physical activity has many positive health benefits. Benefits include strong bones. Weight-bearing physical activity early in life is important in reaching peak bone mass. Weight-bearing physical activities cause muscles and bones to work against gravity. Some examples of weight bearing physical activities include:  Walking, jogging, or running.  DIRECT[employer].  Jumping rope.  Dancing.  Soccer.  Tennis or Racquetball.  Stair climbing.  Basketball.  Hiking.  Weight lifting.  Aerobic fitness classes. Including weight-bearing  physical activity into an exercise plan is a great way to keep bones healthy. Adults: Engage in at least 30 minutes of moderate physical activity on most, preferably all, days of the week. Children: Engage in at least 60 minutes of moderate physical activity on most, preferably all, days of the week. FOR MORE INFORMATION [place] [employer], [employer] for [employer] and [employer]otion: www.cnpp.usda.gov National Osteoporosis Foundation: [employer] Document Released: 11/15/2003 Document Revised: 12/20/2012 Document Reviewed: 02/14/2009 [facility] Patient Information 2015 [place], [place]. This information is not intended to replace advice given to you by your health care provider. Make sure you discuss any questions you have with your health care provider.     [name]. Tamico Mundo M.D.

## 2014-12-26 LAB — HM MAMMOGRAPHY

## 2014-12-27 ENCOUNTER — Encounter: Payer: Self-pay | Admitting: Family Medicine

## 2015-01-02 ENCOUNTER — Encounter: Payer: Self-pay | Admitting: Internal Medicine

## 2015-02-02 ENCOUNTER — Encounter: Payer: Self-pay | Admitting: Internal Medicine

## 2015-02-14 ENCOUNTER — Encounter: Payer: Self-pay | Admitting: Family Medicine

## 2015-02-21 ENCOUNTER — Encounter: Payer: Self-pay | Admitting: Internal Medicine

## 2015-12-04 ENCOUNTER — Ambulatory Visit (INDEPENDENT_AMBULATORY_CARE_PROVIDER_SITE_OTHER): Payer: Medicare Other | Admitting: Family Medicine

## 2015-12-04 ENCOUNTER — Encounter: Payer: Self-pay | Admitting: Family Medicine

## 2015-12-04 VITALS — BP 114/80 | HR 64 | Wt 139.0 lb

## 2015-12-04 DIAGNOSIS — M9901 Segmental and somatic dysfunction of cervical region: Secondary | ICD-10-CM

## 2015-12-04 DIAGNOSIS — M47812 Spondylosis without myelopathy or radiculopathy, cervical region: Secondary | ICD-10-CM | POA: Diagnosis not present

## 2015-12-04 DIAGNOSIS — M999 Biomechanical lesion, unspecified: Secondary | ICD-10-CM | POA: Insufficient documentation

## 2015-12-04 DIAGNOSIS — M9902 Segmental and somatic dysfunction of thoracic region: Secondary | ICD-10-CM | POA: Diagnosis not present

## 2015-12-04 DIAGNOSIS — M542 Cervicalgia: Secondary | ICD-10-CM | POA: Diagnosis not present

## 2015-12-04 DIAGNOSIS — M9908 Segmental and somatic dysfunction of rib cage: Secondary | ICD-10-CM

## 2015-12-04 MED ORDER — GABAPENTIN 100 MG PO CAPS: 200.0000 mg | ORAL_CAPSULE | Freq: Every day | ORAL | Status: DC

## 2015-12-04 NOTE — Assessment & Plan Note (Signed)
I'm concerned the patient's pain is irrigated degenerative disc disease with some mild radiculopathy. We discussed with patient about different treatment options. Patient has elected try conservative therapy. Patient even home exercises and work with Event organiserathletic trainer. We will try over-the-counter natural supplementations. Patient given per schedule for gabapentin to see if this will be helpful. Patient with continue with her other modalities including a chiropractor. Patient did respond fairly well to osteopathic manipulation. Patient come back and see me again in 2 weeks. If worsening symptoms possibly advance imaging would be warranted. I think patient will do well with conservative therapy.

## 2015-12-04 NOTE — Progress Notes (Signed)
Nichole Berg D.O. [facility] [address] [place], [name] [address] Phone: [phone] Subjective:    I'm seeing this patient by the request  of:  Lorretta HarpPANOSH,WANDA KOTVAN, MD   CC: Right shoulder and neck pain  BJY:[id number]:Subjective Nichole BlitzCarol Berg is a 70 y.o. female coming in with complaint of neck and right shoulder pain. Patient has a past medical history significant for rotator cuff tear in 2015 then no surgical intervention was necessary. Patient states that that seemed to get better. Has been doing the exercises religiously for the last 2 years. Patient though states that she started having a different pain. Seem to happen in the neck and radiate to the right shoulder. Denies any numbness. Denies any weakness. States that the pain does seem to be more unrelenting and more of a burning sensation going to the posterior aspect of the shoulder. Patient states sometimes it can go down her tricep. Can be very uncomfortable at night. Rates the severity of pain a 6 out of 10. Has not tried any home modalities except for anti-inflammatories which do help and decreased pain by 80-90%.    Past Medical History  Diagnosis Date  . COLONIC POLYPS, HX OF 10/12/2009  . NEPHROLITHIASIS, HX OF 05/31/2007  . HYPERLIPIDEMIA 05/31/2007   No past surgical history on file. Social History   Social History  . Marital Status: Single    Spouse Name: N/A  . Number of Children: N/A  . Years of Education: N/A   Social History Main Topics  . Smoking status: Former Smoker -- 2 years    Types: Cigarettes    Quit date: 09/08/1964  . Smokeless tobacco: None  . Alcohol Use: Yes  . Drug Use: No  . Sexual Activity: Not Asked   Other Topics Concern  . 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 Sulfonamide Derivatives    Family History  Problem Relation Age of Onset  . Atrial  fibrillation Mother     Has pacer doing very well age 72    Past medical history, social, surgical and family history all reviewed in electronic medical record.  No pertanent information unless stated regarding to the chief complaint.   Review of Systems: No headache, visual changes, nausea, vomiting, diarrhea, constipation, dizziness, abdominal pain, skin rash, fevers, chills, night sweats, weight loss, swollen lymph nodes, body aches, joint swelling, muscle aches, chest pain, shortness of breath, mood changes.   Objective Blood pressure 114/80, pulse 64, weight 139 lb (63.05 kg).  General: No apparent distress alert and oriented x3 mood and affect normal, dressed appropriately.  HEENT: Pupils equal, extraocular movements intact  Respiratory: Patient's speak in full sentences and does not appear short of breath  Cardiovascular: No lower extremity edema, non tender, no erythema  Skin: Warm dry intact with no signs of infection or rash on extremities or on axial skeleton.  Abdomen: Soft nontender  Neuro: Cranial nerves II through XII are intact, neurovascularly intact in all extremities with 2+ DTRs and 2+ pulses.  Lymph: No lymphadenopathy of posterior or anterior cervical chain or axillae bilaterally.  Gait normal with good balance and coordination.  MSK:  Non tender with full range of motion and good stability and symmetric strength and tone of shoulders, elbows, wrist, hip, knee and ankles bilaterally.  Neck: Inspection  unremarkable. No palpable stepoffs. Mild positive Spurling's maneuver and C7 distribution. Limited left-sided rotation and right-sided side bending Grip strength and sensation normal in bilateral hands Strength good C4 to T1 distribution No sensory change to C4 to T1 Negative Hoffman sign bilaterally Reflexes normal   Shoulder: Right Inspection reveals no abnormalities, atrophy or asymmetry. Palpation is normal with no tenderness over AC joint or bicipital  groove. ROM is full in all planes. Rotator cuff strength normal throughout. Mild impingement Speeds and Yergason's tests normal. No labral pathology noted with negative Obrien's, negative clunk and good stability. Normal scapular function observed. No painful arc and no drop arm sign. No apprehension sign Contralateral shoulder unremarkable  Osteopathic findings C7 flexed rotated and side bent right T1 extended rotated and side bent left with elevated first rib  Impression and Recommendations:     This case required medical decision making of moderate complexity.      Note: This dictation was prepared with Dragon dictation along with smaller phrase technology. Any transcriptional errors that result from this process are unintentional.

## 2015-12-04 NOTE — Progress Notes (Signed)
Pre visit review using our clinic review tool, if applicable. No additional management support is needed unless otherwise documented below in the visit note. 

## 2015-12-04 NOTE — Assessment & Plan Note (Signed)
Decision today to treat with OMT was based on Physical Exam  After verbal consent patient was treated with muscle energy, FPR techniques in cervical thoracic and rib areas  Patient tolerated the procedure well with improvement in symptoms  Patient given exercises, stretches and lifestyle modifications  See medications in patient instructions if given  Patient will follow up in 3-4 weeks

## 2015-12-04 NOTE — Patient Instructions (Signed)
Good to see you  Ice 20 minutes 2 times daily. Usually after activity and before bed. Exercises 3 times a week.  pennsaid pinkie amount topically 2 times daily as needed.  Vitamin D 2000 IU daily  Turmeric 500mg  twice daily  If needed do aleve but stop the pennsaid on that day  See me again in 3 weeks.  Have fun golfing.

## 2015-12-05 MED ORDER — GABAPENTIN 100 MG PO CAPS: 200.0000 mg | ORAL_CAPSULE | Freq: Every day | ORAL | Status: DC

## 2016-01-01 ENCOUNTER — Ambulatory Visit (INDEPENDENT_AMBULATORY_CARE_PROVIDER_SITE_OTHER): Payer: Medicare Other | Admitting: Family Medicine

## 2016-01-01 ENCOUNTER — Encounter: Payer: Self-pay | Admitting: Family Medicine

## 2016-01-01 VITALS — BP 122/72 | HR 59 | Ht 64.0 in | Wt 146.0 lb

## 2016-01-01 DIAGNOSIS — M9902 Segmental and somatic dysfunction of thoracic region: Secondary | ICD-10-CM | POA: Diagnosis not present

## 2016-01-01 DIAGNOSIS — M47812 Spondylosis without myelopathy or radiculopathy, cervical region: Secondary | ICD-10-CM | POA: Diagnosis not present

## 2016-01-01 DIAGNOSIS — M8580 Other specified disorders of bone density and structure, unspecified site: Secondary | ICD-10-CM

## 2016-01-01 DIAGNOSIS — M999 Biomechanical lesion, unspecified: Secondary | ICD-10-CM

## 2016-01-01 DIAGNOSIS — M9908 Segmental and somatic dysfunction of rib cage: Secondary | ICD-10-CM | POA: Diagnosis not present

## 2016-01-01 DIAGNOSIS — M9901 Segmental and somatic dysfunction of cervical region: Secondary | ICD-10-CM | POA: Diagnosis not present

## 2016-01-01 NOTE — Assessment & Plan Note (Signed)
Encourage vitamin D supplementation 

## 2016-01-01 NOTE — Assessment & Plan Note (Signed)
Patient is responding to conservative therapy. Patient is doing well with the gabapentin. Continue his symptoms. We discussed icing regimen. Discussed which activities to do in which ones to potentially avoid. Discussed ergonomics are up-to-date. Continues respond well to osteopathic manipulation. Patient will follow-up in 4-6 weeks for further evaluation and treatment.

## 2016-01-01 NOTE — Progress Notes (Signed)
Pre visit review using our clinic review tool, if applicable. No additional management support is needed unless otherwise documented below in the visit note. 

## 2016-01-01 NOTE — Assessment & Plan Note (Signed)
Decision today to treat with OMT was based on Physical Exam  After verbal consent patient was treated with muscle energy, FPR techniques in cervical thoracic and rib areas  Patient tolerated the procedure well with improvement in symptoms  Patient given exercises, stretches and lifestyle modifications  See medications in patient instructions if given  Patient will follow up in 4-6 weeks

## 2016-01-01 NOTE — Patient Instructions (Addendum)
Good to see you  I am happy we are making some progress In 2 weeks try to decrease gabapentin to 100 mg nightly.  If good after 10 days then can stop New exercises On wall with heels, butt shoulder and head touching for a goal of 5 minutes daily  For the other exercises decrease to 2 times a week Continue the vitamins See me again in 6--8 weeks I am happy and give your self a pat on the back

## 2016-01-01 NOTE — Progress Notes (Signed)
Nichole Berg [address] [place], [place] [address] Phone: [phone] Subjective:    I'm seeing this patient by the request  of:  Nichole Harp, MD   CC: Right shoulder and neck pain follow-up  [id number] Nichole Berg is a 70 y.o. female coming in with complaint of neck and right shoulder pain. Patient has a past medical history significant for rotator cuff tear in 2015 then no surgical intervention was necessary.  Patient was seen by me and does have some degenerative arthritis of the cervical spine. Patient elected try conservative therapy including osteopathic manipulation. Patient was to work on Financial controller as well as strengthening the upper back. Patient was to do an icing pedicle. Patient states overall making improvement. Nothing that seems to be worse at this time. Patient did respond well to osteopathic manipulation. When on a cruise and is feeling good overall.    Past Medical History  Diagnosis Date  . COLONIC POLYPS, HX OF 10/12/2009  . NEPHROLITHIASIS, HX OF 05/31/2007  . HYPERLIPIDEMIA 05/31/2007   No past surgical history on file. Social History   Social History  . Marital Status: Single    Spouse Name: N/A  . Number of Children: N/A  . Years of Education: N/A   Social History Main Topics  . Smoking status: Former Smoker -- 2 years    Types: Cigarettes    Quit date: 09/08/1964  . Smokeless tobacco: None  . Alcohol Use: Yes  . Drug Use: No  . Sexual Activity: Not Asked   Other Topics Concern  . 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 Sulfonamide Derivatives    Family History  Problem Relation Age of Onset  . Atrial fibrillation Mother     Has pacer doing very well age 2    Past medical history, social, surgical and family history all reviewed in electronic medical record.  No pertanent  information unless stated regarding to the chief complaint.   Review of Systems: No headache, visual changes, nausea, vomiting, diarrhea, constipation, dizziness, abdominal pain, skin rash, fevers, chills, night sweats, weight loss, swollen lymph nodes, body aches, joint swelling, muscle aches, chest pain, shortness of breath, mood changes.   Objective Blood pressure 122/72, pulse 59, height  (1.626 m), weight 146 lb (66.225 kg), SpO2 96 %.  General: No apparent distress alert and oriented x3 mood and affect normal, dressed appropriately.  HEENT: Pupils equal, extraocular movements intact  Respiratory: Patient's speak in full sentences and does not appear short of breath  Cardiovascular: No lower extremity edema, non tender, no erythema  Skin: Warm dry intact with no signs of infection or rash on extremities or on axial skeleton.  Abdomen: Soft nontender  Neuro: Cranial nerves II through XII are intact, neurovascularly intact in all extremities with 2+ DTRs and 2+ pulses.  Lymph: No lymphadenopathy of posterior or anterior cervical chain or axillae bilaterally.  Gait normal with good balance and coordination.  MSK:  Non tender with full range of motion and good stability and symmetric strength and tone of shoulders, elbows, wrist, hip, knee and ankles bilaterally.  Neck: Inspection unremarkable. No palpable stepoffs. Negative Spurling's which is an improvement. Patient has some mild increase in range of motion Grip strength and sensation normal in bilateral hands Strength good C4  to T1 distribution No sensory change to C4 to T1 Negative Hoffman sign bilaterally Reflexes normal   Shoulder: Right Inspection reveals no abnormalities, atrophy or asymmetry. Palpation is normal with no tenderness over AC joint or bicipital groove. ROM is full in all planes. Rotator cuff strength normal throughout. No impingement noted Speeds and Yergason's tests normal. No labral pathology noted with  negative Obrien's, negative clunk and good stability. Normal scapular function observed. No painful arc and no drop arm sign. No apprehension sign Contralateral shoulder unremarkable  Osteopathic findings C7 flexed rotated and side bent right T1 extended rotated and side bent left with elevated first rib  Impression and Recommendations:     This case required medical decision making of moderate complexity.      Note: This dictation was prepared with Dragon dictation along with smaller phrase technology. Any transcriptional errors that result from this process are unintentional.

## 2016-01-02 ENCOUNTER — Encounter: Payer: Self-pay | Admitting: Internal Medicine

## 2016-01-16 ENCOUNTER — Encounter: Payer: Self-pay | Admitting: Internal Medicine

## 2016-02-05 NOTE — Progress Notes (Signed)
Chief Complaint  Patient presents with  . Medicare Wellness    HPI: Nichole Berg 70 y.o. comes in today for Preventive Medicare wellness visit .Since last visit.feels well   Rotator  cuff injury   And neck pain .  chiro and massage  And  Saw Dr Tamala Julian .    Tried gabapentin .  To try . And exercise.  .  Gabapentin  Now stopped    To use intermittent. Use  Is well   Health Maintenance  Topic Date Due  . Hepatitis C Screening  1946-09-05  . INFLUENZA VACCINE  04/08/2016  . MAMMOGRAM  12/25/2016  . TETANUS/TDAP  10/13/2019  . COLONOSCOPY  12/21/2021  . DEXA SCAN  Completed  . ZOSTAVAX  Addressed  . PNA vac Low Risk Adult  Completed   Health Maintenance Review LIFESTYLE:  TADneg 1-2 per day no Sugar beverages: no Sleep: about 6-7  HH or 2  Partner cat     MEDICARE DOCUMENT QUESTIONS  TO SCAN   Hearing:  ok  Vision:  No limitations at present . Last eye check UTD  Safety:  Has smoke detector and wears seat belts.  No firearms. No excess sun exposure. Sees dentist regularly.  Falls: n  Advance directive :  Reviewed  Has one.  Memory: Felt to be good  , no concern from her or her family.  Depression: No anhedonia unusual crying or depressive symptoms  Nutrition: Eats well balanced diet; adequate calcium and vitamin D. No swallowing chewing problems.  Injury: no major injuries in the last six months.  Other healthcare providers:  Reviewed today .  Social:  Lives with partner pet cat .   Preventive parameters: up-to-date  Reviewed   ADLS:   There are no problems or need for assistance  driving, feeding, obtaining food, dressing, toileting and bathing, managing money using phone. She is independent.    ROS:  GEN/ HEENT: No fever, significant weight changes sweats headaches vision problems hearing changes, CV/ PULM; No chest pain shortness of breath cough, syncope,edema  change in exercise tolerance. GI /GU: No adominal pain, vomiting, change in bowel habits. No  blood in the stool. No significant GU symptoms. SKIN/HEME: ,no acute skin rashes suspicious lesions or bleeding. No lymphadenopathy, nodules, masses.  NEURO/ PSYCH:  No neurologic signs such as weakness numbness. No depression anxiety. IMM/ Allergy: No unusual infections.  Allergy .   REST of 12 system review negative except as per HPI   Past Medical History  Diagnosis Date  . COLONIC POLYPS, HX OF 10/12/2009  . NEPHROLITHIASIS, HX OF 05/31/2007  . HYPERLIPIDEMIA 05/31/2007    Family History  Problem Relation Age of Onset  . Atrial fibrillation Mother     Has pacer doing very well age 16    Social History   Social History  . Marital Status: Single    Spouse Name: N/A  . Number of Children: N/A  . Years of Education: N/A   Social History Main Topics  . Smoking status: Former Smoker -- 2 years    Types: Cigarettes    Quit date: 09/08/1964  . Smokeless tobacco: None  . Alcohol Use: Yes  . Drug Use: No  . Sexual Activity: Not Asked   Other Topics Concern  . 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 Cholecalciferol (VITAMIN D) 2000 UNITS tablet Take 2,000 Units by mouth daily.    Marland Kitchen gabapentin (NEURONTIN) 100 MG capsule Take 2 capsules (200 mg total) by mouth at bedtime.  . lactobacillus rhamnosus, GG, (CULTURELLE) 10 B CELL capsule Take 1 capsule by mouth daily.    . Misc Natural Products (TART CHERRY ADVANCED PO) Take 800 mg by mouth daily.  . Multiple Vitamins-Minerals (ICAPS) CAPS Take by mouth.  . TURMERIC PO Take by mouth.   No facility-administered encounter medications on file as of 02/06/2016.    EXAM:  BP 118/68 mmHg  Pulse 64  Temp(Src) 98.5 F (36.9 C) (Oral)  Ht 5' 3.5" (1.613 m)  Wt 140 lb 5 oz (63.645 kg)  BMI 24.46 kg/m2  SpO2 98%  Body mass index is 24.46 kg/(m^2).  Physical  Exam: Vital signs reviewed [id number] is a well-developed well-nourished alert cooperative   who appears stated age in no acute distress.  HEENT: normocephalic atraumatic , Eyes: PERRL EOM's full, conjunctiva clear, Nares: paten,t no deformity discharge or tenderness., Ears: no deformity EAC's clear TMs with normal landmarks. Mouth: clear OP, no lesions, edema.  Moist mucous membranes. Dentition in adequate repair. NECK: supple without masses, thyromegaly or bruits. CHEST/PULM:  Clear to auscultation and percussion breath sounds equal no wheeze , rales or rhonchi. No chest wall deformities or tenderness. CV: PMI is nondisplaced, S1 S2 no gallops, murmurs, rubs. Peripheral pulses are full without delay.No JVD . Breast: normal by inspection . No dimpling, discharge, masses, tenderness or discharge . ABDOMEN: Bowel sounds normal nontender  No guard or rebound, no hepato splenomegal no CVA tenderness.   Extremtities:  No clubbing cyanosis or edema, no acute joint swelling or redness no focal atrophy NEURO:  Oriented x3, cranial nerves 3-12 appear to be intact, no obvious focal weakness,gait within normal limits no abnormal reflexes or asymmetrical SKIN: No acute rashes normal turgor, color, no bruising or petechiae.sun changes  PSYCH: Oriented, good eye contact, no obvious depression anxiety, cognition and judgment appear normal. LN: no cervical axillary inguinal adenopathy No noted deficits in memory, attention, and speech.     ASSESSMENT AND PLAN:  Discussed the following assessment and plan:  Visit for preventive health examination - Plan: Basic metabolic panel, CBC with Differential/Platelet, Hepatic function panel, Lipid panel, TSH  Medicare annual wellness visit, subsequent  Hyperlipidemia - Plan: Basic metabolic panel, CBC with Differential/Platelet, Hepatic function panel, Lipid panel, TSH Disc mammogram  Guidelines   Lipid guidelines etc  Patient Care Team: Burnis Medin, MD as PCP -  General Richmond Campbell, MD as Consulting Physician (Gastroenterology) Shon Hough, MD as Consulting Physician (Ophthalmology)  Patient Instructions  Continue lifestyle intervention healthy eating and exercise .  Will notify you  of labs when available. Ok to do mammogram every 2 year if you dont have dense breast on mammo but not later than every 24 months.    Health Maintenance, Female Adopting a healthy lifestyle and getting preventive care can go a long way to promote health and wellness. Talk with your health care provider about what schedule of regular examinations is right for you. This is a good chance for you to check in with your provider about disease prevention and staying healthy. In between checkups, there are plenty of things you can do on your own. Experts have done a lot of research about which lifestyle changes and preventive measures are most likely to keep you healthy. Ask  your health care provider for more information. WEIGHT AND DIET  Eat a healthy diet  Be sure to include plenty of vegetables, fruits, low-fat dairy products, and lean protein.  Do not eat a lot of foods high in solid fats, added sugars, or salt.  Get regular exercise. This is one of the most important things you can do for your health.  Most adults should exercise for at least 150 minutes each week. The exercise should increase your heart rate and make you sweat (moderate-intensity exercise).  Most adults should also do strengthening exercises at least twice a week. This is in addition to the moderate-intensity exercise.  Maintain a healthy weight  Body mass index (BMI) is a measurement that can be used to identify possible weight problems. It estimates body fat based on height and weight. Your health care provider can help determine your BMI and help you achieve or maintain a healthy weight.  For females 11 years of age and older:   A BMI below 18.5 is considered underweight.  A BMI of  18.5 to 24.9 is normal.  A BMI of 25 to 29.9 is considered overweight.  A BMI of 30 and above is considered obese.  Watch levels of cholesterol and blood lipids  You should start having your blood tested for lipids and cholesterol at 72 years of age, then have this test every 5 years.  You may need to have your cholesterol levels checked more often if:  Your lipid or cholesterol levels are high.  You are older than 72 years of age.  You are at high risk for heart disease.  CANCER SCREENING   Lung Cancer  Lung cancer screening is recommended for adults 38-61 years old who are at high risk for lung cancer because of a history of smoking.  A yearly low-dose CT scan of the lungs is recommended for people who:  Currently smoke.  Have quit within the past 15 years.  Have at least a 30-pack-year history of smoking. A pack year is smoking an average of one pack of cigarettes a day for 1 year.  Yearly screening should continue until it has been 15 years since you quit.  Yearly screening should stop if you develop a health problem that would prevent you from having lung cancer treatment.  Breast Cancer  Practice breast self-awareness. This means understanding how your breasts normally appear and feel.  It also means doing regular breast self-exams. Let your health care provider know about any changes, no matter how small.  If you are in your 20s or 30s, you should have a clinical breast exam (CBE) by a health care provider every 1-3 years as part of a regular health exam.  If you are 41 or older, have a CBE every year. Also consider having a breast X-ray (mammogram) every year.  If you have a family history of breast cancer, talk to your health care provider about genetic screening.  If you are at high risk for breast cancer, talk to your health care provider about having an MRI and a mammogram every year.  Breast cancer gene (BRCA) assessment is recommended for women who  have family members with BRCA-related cancers. BRCA-related cancers include:  Breast.  Ovarian.  Tubal.  Peritoneal cancers.  Results of the assessment will determine the need for genetic counseling and BRCA1 and BRCA2 testing. Cervical Cancer Your health care provider may recommend that you be screened regularly for cancer of the pelvic organs (ovaries, uterus, and vagina).  This screening involves a pelvic examination, including checking for microscopic changes to the surface of your cervix (Pap test). You may be encouraged to have this screening done every 3 years, beginning at age 55.  For women ages 79-65, health care providers may recommend pelvic exams and Pap testing every 3 years, or they may recommend the Pap and pelvic exam, combined with testing for human papilloma virus (HPV), every 5 years. Some types of HPV increase your risk of cervical cancer. Testing for HPV may also be done on women of any age with unclear Pap test results.  Other health care providers may not recommend any screening for nonpregnant women who are considered low risk for pelvic cancer and who do not have symptoms. Ask your health care provider if a screening pelvic exam is right for you.  If you have had past treatment for cervical cancer or a condition that could lead to cancer, you need Pap tests and screening for cancer for at least 20 years after your treatment. If Pap tests have been discontinued, your risk factors (such as having a new sexual partner) need to be reassessed to determine if screening should resume. Some women have medical problems that increase the chance of getting cervical cancer. In these cases, your health care provider may recommend more frequent screening and Pap tests. Colorectal Cancer  This type of cancer can be detected and often prevented.  Routine colorectal cancer screening usually begins at 72 years of age and continues through 72 years of age.  Your health care provider  may recommend screening at an earlier age if you have risk factors for colon cancer.  Your health care provider may also recommend using home test kits to check for hidden blood in the stool.  A small camera at the end of a tube can be used to examine your colon directly (sigmoidoscopy or colonoscopy). This is done to check for the earliest forms of colorectal cancer.  Routine screening usually begins at age 65.  Direct examination of the colon should be repeated every 5-10 years through 72 years of age. However, you may need to be screened more often if early forms of precancerous polyps or small growths are found. Skin Cancer  Check your skin from head to toe regularly.  Tell your health care provider about any new moles or changes in moles, especially if there is a change in a mole's shape or color.  Also tell your health care provider if you have a mole that is larger than the size of a pencil eraser.  Always use sunscreen. Apply sunscreen liberally and repeatedly throughout the day.  Protect yourself by wearing long sleeves, pants, a wide-brimmed hat, and sunglasses whenever you are outside. HEART DISEASE, DIABETES, AND HIGH BLOOD PRESSURE   High blood pressure causes heart disease and increases the risk of stroke. High blood pressure is more likely to develop in:  People who have blood pressure in the high end of the normal range (130-139/85-89 mm Hg).  People who are overweight or obese.  People who are African American.  If you are 50-47 years of age, have your blood pressure checked every 3-5 years. If you are 65 years of age or older, have your blood pressure checked every year. You should have your blood pressure measured twice--once when you are at a hospital or clinic, and once when you are not at a hospital or clinic. Record the average of the two measurements. To check your blood pressure when  you are not at a hospital or clinic, you can use:  An automated blood  pressure machine at a pharmacy.  A home blood pressure monitor.  If you are between 68 years and 60 years old, ask your health care provider if you should take aspirin to prevent strokes.  Have regular diabetes screenings. This involves taking a blood sample to check your fasting blood sugar level.  If you are at a normal weight and have a low risk for diabetes, have this test once every three years after 72 years of age.  If you are overweight and have a high risk for diabetes, consider being tested at a younger age or more often. PREVENTING INFECTION  Hepatitis B  If you have a higher risk for hepatitis B, you should be screened for this virus. You are considered at high risk for hepatitis B if:  You were born in a country where hepatitis B is common. Ask your health care provider which countries are considered high risk.  Your parents were born in a high-risk country, and you have not been immunized against hepatitis B (hepatitis B vaccine).  You have HIV or AIDS.  You use needles to inject street drugs.  You live with someone who has hepatitis B.  You have had sex with someone who has hepatitis B.  You get hemodialysis treatment.  You take certain medicines for conditions, including cancer, organ transplantation, and autoimmune conditions. Hepatitis C  Blood testing is recommended for:  Everyone born from [birth year] through 1967.  Anyone with known risk factors for hepatitis C. Sexually transmitted infections (STIs)  You should be screened for sexually transmitted infections (STIs) including gonorrhea and chlamydia if:  You are sexually active and are younger than 72 years of age.  You are older than 72 years of age and your health care provider tells you that you are at risk for this type of infection.  Your sexual activity has changed since you were last screened and you are at an increased risk for chlamydia or gonorrhea. Ask your health care provider if you are at  risk.  If you do not have HIV, but are at risk, it may be recommended that you take a prescription medicine daily to prevent HIV infection. This is called pre-exposure prophylaxis (PrEP). You are considered at risk if:  You are sexually active and do not regularly use condoms or know the HIV status of your partner(s).  You take drugs by injection.  You are sexually active with a partner who has HIV. Talk with your health care provider about whether you are at high risk of being infected with HIV. If you choose to begin PrEP, you should first be tested for HIV. You should then be tested every 3 months for as long as you are taking PrEP.  PREGNANCY   If you are premenopausal and you may become pregnant, ask your health care provider about preconception counseling.  If you may become pregnant, take 400 to 800 micrograms (mcg) of folic acid every day.  If you want to prevent pregnancy, talk to your health care provider about birth control (contraception). OSTEOPOROSIS AND MENOPAUSE   Osteoporosis is a disease in which the bones lose minerals and strength with aging. This can result in serious bone fractures. Your risk for osteoporosis can be identified using a bone density scan.  If you are 33 years of age or older, or if you are at risk for osteoporosis and fractures, ask your health care  provider if you should be screened.  Ask your health care provider whether you should take a calcium or vitamin D supplement to lower your risk for osteoporosis.  Menopause may have certain physical symptoms and risks.  Hormone replacement therapy may reduce some of these symptoms and risks. Talk to your health care provider about whether hormone replacement therapy is right for you.  HOME CARE INSTRUCTIONS   Schedule regular health, dental, and eye exams.  Stay current with your immunizations.   Do not use any tobacco products including cigarettes, chewing tobacco, or electronic cigarettes.  If  you are pregnant, do not drink alcohol.  If you are breastfeeding, limit how much and how often you drink alcohol.  Limit alcohol intake to no more than 1 drink per day for nonpregnant women. One drink equals 12 ounces of beer, 5 ounces of wine, or 1 ounces of hard liquor.  Do not use street drugs.  Do not share needles.  Ask your health care provider for help if you need support or information about quitting drugs.  Tell your health care provider if you often feel depressed.  Tell your health care provider if you have ever been abused or do not feel safe at home.   This information is not intended to replace advice given to you by your health care provider. Make sure you discuss any questions you have with your health care provider.   Document Released: 03/10/2011 Document Revised: 09/15/2014 Document Reviewed: 07/27/2013 Elsevier Interactive Patient Education 2016 Quesada K. Panosh M.D.

## 2016-02-06 ENCOUNTER — Encounter: Payer: Self-pay | Admitting: Internal Medicine

## 2016-02-06 ENCOUNTER — Ambulatory Visit (INDEPENDENT_AMBULATORY_CARE_PROVIDER_SITE_OTHER): Payer: Medicare Other | Admitting: Internal Medicine

## 2016-02-06 VITALS — BP 118/68 | HR 64 | Temp 98.5°F | Ht 63.5 in | Wt 140.3 lb

## 2016-02-06 DIAGNOSIS — E785 Hyperlipidemia, unspecified: Secondary | ICD-10-CM

## 2016-02-06 DIAGNOSIS — Z0000 Encounter for general adult medical examination without abnormal findings: Secondary | ICD-10-CM

## 2016-02-06 LAB — BASIC METABOLIC PANEL
BUN: 15 mg/dL (ref 6–23)
CO2: 29 meq/L (ref 19–32)
Calcium: 9.4 mg/dL (ref 8.4–10.5)
Chloride: 102 mEq/L (ref 96–112)
Creatinine, Ser: 0.66 mg/dL (ref 0.40–1.20)
GFR: 93.5 mL/min (ref >60.00)
GLUCOSE: 84 mg/dL (ref 70–99)
POTASSIUM: 4 meq/L (ref 3.5–5.1)
Sodium: 137 mEq/L (ref 135–145)

## 2016-02-06 LAB — CBC WITH DIFFERENTIAL/PLATELET
Basophils Absolute: 0 10*3/uL (ref 0.0–0.1)
Basophils Relative: 0.3 % (ref 0.0–3.0)
EOS PCT: 0.7 % (ref 0.0–5.0)
Eosinophils Absolute: 0 10*3/uL (ref 0.0–0.7)
HCT: 38 % (ref 36.0–46.0)
Hemoglobin: 12.6 g/dL (ref 12.0–15.0)
LYMPHS ABS: 1.1 10*3/uL (ref 0.7–4.0)
Lymphocytes Relative: 27.4 % (ref 12.0–46.0)
MCHC: 33.3 g/dL (ref 30.0–36.0)
MCV: 92.6 fl (ref 78.0–100.0)
MONO ABS: 0.5 10*3/uL (ref 0.1–1.0)
Monocytes Relative: 11.5 % (ref 3.0–12.0)
NEUTROS ABS: 2.4 10*3/uL (ref 1.4–7.7)
Neutrophils Relative %: 60.1 % (ref 43.0–77.0)
PLATELETS: 270 10*3/uL (ref 150.0–400.0)
RBC: 4.1 Mil/uL (ref 3.87–5.11)
RDW: 13.5 % (ref 11.5–15.5)
WBC: 4 10*3/uL (ref 4.0–10.5)

## 2016-02-06 LAB — TSH: TSH: 1.41 u[IU]/mL (ref 0.35–4.50)

## 2016-02-06 LAB — HEPATIC FUNCTION PANEL
ALBUMIN: 4.5 g/dL (ref 3.5–5.2)
ALT: 14 U/L (ref 0–35)
AST: 23 U/L (ref 0–37)
Alkaline Phosphatase: 69 U/L (ref 39–117)
BILIRUBIN TOTAL: 1.2 mg/dL (ref 0.2–1.2)
Bilirubin, Direct: 0.2 mg/dL (ref 0.0–0.3)
Total Protein: 6.9 g/dL (ref 6.0–8.3)

## 2016-02-06 LAB — LIPID PANEL
CHOL/HDL RATIO: 3
Cholesterol: 239 mg/dL — ABNORMAL HIGH (ref 0–200)
HDL: 71.4 mg/dL (ref >39.00)
LDL Cholesterol: 154 mg/dL — ABNORMAL HIGH (ref 0–99)
NONHDL: 167.29
Triglycerides: 66 mg/dL (ref 0.0–149.0)
VLDL: 13.2 mg/dL (ref 0.0–40.0)

## 2016-02-06 NOTE — Patient Instructions (Addendum)
Continue lifestyle intervention healthy eating and exercise .  Will notify you  of labs when available. Ok to do mammogram every 2 year if you dont have dense breast on mammo but not later than every 24 months.    Health Maintenance, Female Adopting a healthy lifestyle and getting preventive care can go a long way to promote health and wellness. Talk with your health care provider about what schedule of regular examinations is right for you. This is a good chance for you to check in with your provider about disease prevention and staying healthy. In between checkups, there are plenty of things you can do on your own. Experts have done a lot of research about which lifestyle changes and preventive measures are most likely to keep you healthy. Ask your health care provider for more information. WEIGHT AND DIET  Eat a healthy diet  Be sure to include plenty of vegetables, fruits, low-fat dairy products, and lean protein.  Do not eat a lot of foods high in solid fats, added sugars, or salt.  Get regular exercise. This is one of the most important things you can do for your health.  Most adults should exercise for at least 150 minutes each week. The exercise should increase your heart rate and make you sweat (moderate-intensity exercise).  Most adults should also do strengthening exercises at least twice a week. This is in addition to the moderate-intensity exercise.  Maintain a healthy weight  Body mass index (BMI) is a measurement that can be used to identify possible weight problems. It estimates body fat based on height and weight. Your health care provider can help determine your BMI and help you achieve or maintain a healthy weight.  For females 72 years of age and older:   A BMI below 18.5 is considered underweight.  A BMI of 18.5 to 24.9 is normal.  A BMI of 25 to 29.9 is considered overweight.  A BMI of 30 and above is considered obese.  Watch levels of cholesterol and blood  lipids  You should start having your blood tested for lipids and cholesterol at 72 years of age, then have this test every 5 years.  You may need to have your cholesterol levels checked more often if:  Your lipid or cholesterol levels are high.  You are older than 72 years of age.  You are at high risk for heart disease.  CANCER SCREENING   Lung Cancer  Lung cancer screening is recommended for adults 72-28 years old who are at high risk for lung cancer because of a history of smoking.  A yearly low-dose CT scan of the lungs is recommended for people who:  Currently smoke.  Have quit within the past 15 years.  Have at least a 30-pack-year history of smoking. A pack year is smoking an average of one pack of cigarettes a day for 1 year.  Yearly screening should continue until it has been 15 years since you quit.  Yearly screening should stop if you develop a health problem that would prevent you from having lung cancer treatment.  Breast Cancer  Practice breast self-awareness. This means understanding how your breasts normally appear and feel.  It also means doing regular breast self-exams. Let your health care provider know about any changes, no matter how small.  If you are in your 20s or 30s, you should have a clinical breast exam (CBE) by a health care provider every 1-3 years as part of a regular health exam.  If you are 40 or older, have a CBE every year. Also consider having a breast X-ray (mammogram) every year.  If you have a family history of breast cancer, talk to your health care provider about genetic screening.  If you are at high risk for breast cancer, talk to your health care provider about having an MRI and a mammogram every year.  Breast cancer gene (BRCA) assessment is recommended for women who have family members with BRCA-related cancers. BRCA-related cancers include:  Breast.  Ovarian.  Tubal.  Peritoneal cancers.  Results of the assessment  will determine the need for genetic counseling and BRCA1 and BRCA2 testing. Cervical Cancer Your health care provider may recommend that you be screened regularly for cancer of the pelvic organs (ovaries, uterus, and vagina). This screening involves a pelvic examination, including checking for microscopic changes to the surface of your cervix (Pap test). You may be encouraged to have this screening done every 3 years, beginning at age 72.  For women ages 59-65, health care providers may recommend pelvic exams and Pap testing every 3 years, or they may recommend the Pap and pelvic exam, combined with testing for human papilloma virus (HPV), every 5 years. Some types of HPV increase your risk of cervical cancer. Testing for HPV may also be done on women of any age with unclear Pap test results.  Other health care providers may not recommend any screening for nonpregnant women who are considered low risk for pelvic cancer and who do not have symptoms. Ask your health care provider if a screening pelvic exam is right for you.  If you have had past treatment for cervical cancer or a condition that could lead to cancer, you need Pap tests and screening for cancer for at least 20 years after your treatment. If Pap tests have been discontinued, your risk factors (such as having a new sexual partner) need to be reassessed to determine if screening should resume. Some women have medical problems that increase the chance of getting cervical cancer. In these cases, your health care provider may recommend more frequent screening and Pap tests. Colorectal Cancer  This type of cancer can be detected and often prevented.  Routine colorectal cancer screening usually begins at 72 years of age and continues through 72 years of age.  Your health care provider may recommend screening at an earlier age if you have risk factors for colon cancer.  Your health care provider may also recommend using home test kits to check  for hidden blood in the stool.  A small camera at the end of a tube can be used to examine your colon directly (sigmoidoscopy or colonoscopy). This is done to check for the earliest forms of colorectal cancer.  Routine screening usually begins at age 26.  Direct examination of the colon should be repeated every 5-10 years through 72 years of age. However, you may need to be screened more often if early forms of precancerous polyps or small growths are found. Skin Cancer  Check your skin from head to toe regularly.  Tell your health care provider about any new moles or changes in moles, especially if there is a change in a mole's shape or color.  Also tell your health care provider if you have a mole that is larger than the size of a pencil eraser.  Always use sunscreen. Apply sunscreen liberally and repeatedly throughout the day.  Protect yourself by wearing long sleeves, pants, a wide-brimmed hat, and sunglasses whenever you  are outside. HEART DISEASE, DIABETES, AND HIGH BLOOD PRESSURE   High blood pressure causes heart disease and increases the risk of stroke. High blood pressure is more likely to develop in:  People who have blood pressure in the high end of the normal range (130-139/85-89 mm Hg).  People who are overweight or obese.  People who are African American.  If you are 18-39 years of age, have your blood pressure checked every 3-5 years. If you are 40 years of age or older, have your blood pressure checked every year. You should have your blood pressure measured twice--once when you are at a hospital or clinic, and once when you are not at a hospital or clinic. Record the average of the two measurements. To check your blood pressure when you are not at a hospital or clinic, you can use:  An automated blood pressure machine at a pharmacy.  A home blood pressure monitor.  If you are between 55 years and 79 years old, ask your health care provider if you should take  aspirin to prevent strokes.  Have regular diabetes screenings. This involves taking a blood sample to check your fasting blood sugar level.  If you are at a normal weight and have a low risk for diabetes, have this test once every three years after 72 years of age.  If you are overweight and have a high risk for diabetes, consider being tested at a younger age or more often. PREVENTING INFECTION  Hepatitis B  If you have a higher risk for hepatitis B, you should be screened for this virus. You are considered at high risk for hepatitis B if:  You were born in a country where hepatitis B is common. Ask your health care provider which countries are considered high risk.  Your parents were born in a high-risk country, and you have not been immunized against hepatitis B (hepatitis B vaccine).  You have HIV or AIDS.  You use needles to inject street drugs.  You live with someone who has hepatitis B.  You have had sex with someone who has hepatitis B.  You get hemodialysis treatment.  You take certain medicines for conditions, including cancer, organ transplantation, and autoimmune conditions. Hepatitis C  Blood testing is recommended for:  Everyone born from 1945 through 1965.  Anyone with known risk factors for hepatitis C. Sexually transmitted infections (STIs)  You should be screened for sexually transmitted infections (STIs) including gonorrhea and chlamydia if:  You are sexually active and are younger than 72 years of age.  You are older than 72 years of age and your health care provider tells you that you are at risk for this type of infection.  Your sexual activity has changed since you were last screened and you are at an increased risk for chlamydia or gonorrhea. Ask your health care provider if you are at risk.  If you do not have HIV, but are at risk, it may be recommended that you take a prescription medicine daily to prevent HIV infection. This is called  pre-exposure prophylaxis (PrEP). You are considered at risk if:  You are sexually active and do not regularly use condoms or know the HIV status of your partner(s).  You take drugs by injection.  You are sexually active with a partner who has HIV. Talk with your health care provider about whether you are at high risk of being infected with HIV. If you choose to begin PrEP, you should first be tested for   HIV. You should then be tested every 3 months for as long as you are taking PrEP.  PREGNANCY   If you are premenopausal and you may become pregnant, ask your health care provider about preconception counseling.  If you may become pregnant, take 400 to 800 micrograms (mcg) of folic acid every day.  If you want to prevent pregnancy, talk to your health care provider about birth control (contraception). OSTEOPOROSIS AND MENOPAUSE   Osteoporosis is a disease in which the bones lose minerals and strength with aging. This can result in serious bone fractures. Your risk for osteoporosis can be identified using a bone density scan.  If you are 65 years of age or older, or if you are at risk for osteoporosis and fractures, ask your health care provider if you should be screened.  Ask your health care provider whether you should take a calcium or vitamin D supplement to lower your risk for osteoporosis.  Menopause may have certain physical symptoms and risks.  Hormone replacement therapy may reduce some of these symptoms and risks. Talk to your health care provider about whether hormone replacement therapy is right for you.  HOME CARE INSTRUCTIONS   Schedule regular health, dental, and eye exams.  Stay current with your immunizations.   Do not use any tobacco products including cigarettes, chewing tobacco, or electronic cigarettes.  If you are pregnant, do not drink alcohol.  If you are breastfeeding, limit how much and how often you drink alcohol.  Limit alcohol intake to no more than 1  drink per day for nonpregnant women. One drink equals 12 ounces of beer, 5 ounces of wine, or 1 ounces of hard liquor.  Do not use street drugs.  Do not share needles.  Ask your health care provider for help if you need support or information about quitting drugs.  Tell your health care provider if you often feel depressed.  Tell your health care provider if you have ever been abused or do not feel safe at home.   This information is not intended to replace advice given to you by your health care provider. Make sure you discuss any questions you have with your health care provider.   Document Released: 03/10/2011 Document Revised: 09/15/2014 Document Reviewed: 07/27/2013 Elsevier Interactive Patient Education 2016 [employer] Inc.  

## 2016-03-03 ENCOUNTER — Encounter: Payer: Self-pay | Admitting: Family Medicine

## 2016-03-03 ENCOUNTER — Ambulatory Visit (INDEPENDENT_AMBULATORY_CARE_PROVIDER_SITE_OTHER): Payer: Medicare Other | Admitting: Family Medicine

## 2016-03-03 VITALS — BP 120/78 | HR 76 | Wt 143.0 lb

## 2016-03-03 DIAGNOSIS — M9901 Segmental and somatic dysfunction of cervical region: Secondary | ICD-10-CM | POA: Diagnosis not present

## 2016-03-03 DIAGNOSIS — M9908 Segmental and somatic dysfunction of rib cage: Secondary | ICD-10-CM

## 2016-03-03 DIAGNOSIS — M47812 Spondylosis without myelopathy or radiculopathy, cervical region: Secondary | ICD-10-CM | POA: Diagnosis not present

## 2016-03-03 DIAGNOSIS — M999 Biomechanical lesion, unspecified: Secondary | ICD-10-CM

## 2016-03-03 DIAGNOSIS — M9902 Segmental and somatic dysfunction of thoracic region: Secondary | ICD-10-CM

## 2016-03-03 NOTE — Progress Notes (Signed)
Nichole Berg D.O. [facility] [address] [place], [name] [address] Phone: [phone]) [phone] Subjective:    I'm seeing this patient by the request  of:  Lorretta HarpPANOSH,WANDA KOTVAN, MD   CC: Right shoulder and neck pain follow-up  [id number]:Subjective Nichole Berg is a 70 y.o. female coming in with complaint of neck and right shoulder pain. Patient has a past medical history significant for rotator cuff tear in 2015 then no surgical intervention was necessary.  Patient was seen by me and does have some degenerative arthritis of the cervical spine. Patient elected try conservative therapy including osteopathic manipulation. Patient was responding to conservative therapy. Patient was to start decreasing her gabapentin. Patient was taking the over-the-counter natural supplementations. Patient states Overall she seems to be doing a little bit better. No worse. Patient rates the severity of pain as 2 out of 10. Able to do all daily activities. Is also playing golf on a very regular basis. Still has some discomfort of the neck and upper back but nothing severe.    Past Medical History  Diagnosis Date  . COLONIC POLYPS, HX OF 10/12/2009  . NEPHROLITHIASIS, HX OF 05/31/2007  . HYPERLIPIDEMIA 05/31/2007   Past Surgical History  Procedure Laterality Date  . Cataract extraction, bilateral     Social History   Social History  . Marital Status: Single    Spouse Name: N/A  . Number of Children: N/A  . Years of Education: N/A   Social History Main Topics  . Smoking status: Former Smoker -- 2 years    Types: Cigarettes    Quit date: 09/08/1964  . Smokeless tobacco: None  . Alcohol Use: Yes  . Drug Use: No  . Sexual Activity: Not Asked   Other Topics Concern  . 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 Sulfonamide Derivatives    Family History  Problem Relation Age of  Onset  . Atrial fibrillation Mother     Has pacer doing very well age 72    Past medical history, social, surgical and family history all reviewed in electronic medical record.  No pertanent information unless stated regarding to the chief complaint.   Review of Systems: No headache, visual changes, nausea, vomiting, diarrhea, constipation, dizziness, abdominal pain, skin rash, fevers, chills, night sweats, weight loss, swollen lymph nodes, body aches, joint swelling, muscle aches, chest pain, shortness of breath, mood changes.   Objective Blood pressure 120/78, pulse 76, weight 143 lb (64.864 kg).  General: No apparent distress alert and oriented x3 mood and affect normal, dressed appropriately.  HEENT: Pupils equal, extraocular movements intact  Respiratory: Patient's speak in full sentences and does not appear short of breath  Cardiovascular: No lower extremity edema, non tender, no erythema  Skin: Warm dry intact with no signs of infection or rash on extremities or on axial skeleton.  Abdomen: Soft nontender  Neuro: Cranial nerves II through XII are intact, neurovascularly intact in all extremities with 2+ DTRs and 2+ pulses.  Lymph: No lymphadenopathy of posterior or anterior cervical chain or axillae bilaterally.  Gait normal with good balance and coordination.  MSK:  Non tender with full range of motion and good stability and symmetric strength and tone of shoulders, elbows, wrist, hip, knee and ankles bilaterally.  Neck: Inspection unremarkable. No palpable stepoffs. Negative  Spurling's which is an improvement. Near full range of motion Grip strength and sensation normal in bilateral hands Strength good C4 to T1 distribution No sensory change to C4 to T1 Negative Hoffman sign bilaterally Reflexes normal   Shoulder: Right Inspection reveals no abnormalities, atrophy or asymmetry. Palpation is normal with no tenderness over AC joint or bicipital groove. ROM is full in all  planes. Rotator cuff strength normal throughout. No impingement noted Speeds and Yergason's tests normal. No labral pathology noted with negative Obrien's, negative clunk and good stability. Normal scapular function observed. No painful arc and no drop arm sign. No apprehension sign Contralateral shoulder unremarkable  Osteopathic findings C7 flexed rotated and side bent right T1 extended rotated and side bent left with elevated first rib T3 extended rotated and side bent right  Impression and Recommendations:     This case required medical decision making of moderate complexity.      Note: This dictation was prepared with Dragon dictation along with smaller phrase technology. Any transcriptional errors that result from this process are unintentional.

## 2016-03-03 NOTE — Assessment & Plan Note (Signed)
Decision today to treat with OMT was based on Physical Exam  After verbal consent patient was treated with muscle energy, FPR techniques in cervical thoracic and rib areas  Patient tolerated the procedure well with improvement in symptoms  Patient given exercises, stretches and lifestyle modifications  See medications in patient instructions if given  Patient will follow up in 6 weeks      

## 2016-03-03 NOTE — Assessment & Plan Note (Signed)
Continues to have discomfort. We did discuss the possibility of repeating x-rays which patient declined. Patient has titrate herself off the gabapentin at the moment. We discussed what to do when she has flares. Patient use other medications over-the-counter medications. We discussed the importance of some strength training and postural control. Patient will follow-up again in 6-8 weeks.

## 2016-03-03 NOTE — Patient Instructions (Addendum)
Good to see you   Ice is you friend is you are having pain  lets not change a lot.  Keep working on the posture.  Gabapentin at night if needed! Aleve still as needed as well.  Stay active See me again in 6 weeks.

## 2016-03-04 ENCOUNTER — Ambulatory Visit: Payer: Medicare Other | Admitting: Family Medicine

## 2016-04-21 ENCOUNTER — Ambulatory Visit: Payer: Medicare Other | Admitting: Family Medicine

## 2016-08-05 NOTE — Progress Notes (Signed)
Tawana ScaleZach Tecla Mailloux D.O. [facility] [address] [place], [name] [address] Phone: [phone]) [phone] Subjective:    I'm seeing this patient by the request  of:  Lorretta HarpPANOSH,WANDA KOTVAN, MD   CC: Right shoulder and neck pain follow-up  [id number]:Subjective  [name] is a 70 y.o. female coming in with complaint of neck and right shoulder pain. Patient has a past medical history significant for rotator cuff tear in 2015 then no surgical intervention was necessary.  Patient was seen by me and does have some degenerative arthritis of the cervical spine. Patient elected try conservative therapy including osteopathic manipulation. Last seen 5 months ago. Patient states that she is starting increasing right upper back pain. Has been doing the exercises occasionally. Inserted working out with a Psychologist, educationaltrainer that could've exacerbated the situation as well. No radiation down the arm. Seems to stay mostly in the shoulder region. Denies any radiation down the arm or any weakness. Does not remember any specific injury.  Past Medical History:  Diagnosis Date  . COLONIC POLYPS, HX OF 10/12/2009  . HYPERLIPIDEMIA 05/31/2007  . NEPHROLITHIASIS, HX OF 05/31/2007   Past Surgical History:  Procedure Laterality Date  . CATARACT EXTRACTION, BILATERAL     Social History   Social History  . Marital status: Single    Spouse name: N/A  . Number of children: N/A  . Years of education: N/A   Social History Main Topics  . Smoking status: Former Smoker    Years: 2.00    Types: Cigarettes    Quit date: 09/08/1964  . Smokeless tobacco: None  . Alcohol use Yes  . Drug use: No  . Sexual activity: Not Asked   Other Topics Concern  . 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 Sulfonamide Derivatives    Family History  Problem Relation Age of Onset  . Atrial fibrillation Mother     Has pacer  doing very well age 72    Past medical history, social, surgical and family history all reviewed in electronic medical record.  No pertanent information unless stated regarding to the chief complaint.   Review of Systems: No headache, visual changes, nausea, vomiting, diarrhea, constipation, dizziness, abdominal pain, skin rash, fevers, chills, night sweats, weight loss, swollen lymph nodes, body aches, joint swelling, muscle aches, chest pain, shortness of breath, mood changes.   Objective  Blood pressure 110/76, pulse 68, height 5\' 4"  (1.626 m), SpO2 97 %.  Systems examined below as of 08/06/16 General: NAD A&O x3 mood, affect normal  HEENT: Pupils equal, extraocular movements intact no nystagmus Respiratory: not short of breath at rest or with speaking Cardiovascular: No lower extremity edema, non tender Skin: Warm dry intact with no signs of infection or rash on extremities or on axial skeleton. Abdomen: Soft nontender, no masses Neuro: Cranial nerves  intact, neurovascularly intact in all extremities with 2+ DTRs and 2+ pulses. Lymph: No lymphadenopathy appreciated today  Gait normal with good balance and coordination.  MSK: Non tender with full range of motion and good stability and symmetric strength and tone of shoulders, elbows, wrist,  knee hips and ankles bilaterally.  Neck: Inspection unremarkable. No palpable stepoffs. Negative Spurling's which is an improvement. Near full range of motion Grip strength and sensation normal in bilateral hands Strength good C4 to T1  distribution No sensory change to C4 to T1 Negative Hoffman sign bilaterally Reflexes normal Very mild tightness of the trapezius on the right side Shoulder: Inspection reveals no abnormalities, atrophy or asymmetry. Palpation is normal with no tenderness over AC joint or bicipital groove. ROM is full in all planes. Rotator cuff strength normal throughout. No signs of impingement with negative Neer and  Hawkin's tests, empty can sign. Speeds and Yergason's tests normal. No labral pathology noted with negative Obrien's, negative clunk and good stability. Normal scapular function observed. No painful arc and no drop arm sign. No apprehension sign     Osteopathic findings C7 flexed rotated and side bent right T1 extended rotated and side bent left with elevated first rib T3 extended rotated and side bent right inhaled third rib  Impression and Recommendations:     This case required medical decision making of moderate complexity.      Note: This dictation was prepared with Dragon dictation along with smaller phrase technology. Any transcriptional errors that result from this process are unintentional.

## 2016-08-06 ENCOUNTER — Ambulatory Visit (INDEPENDENT_AMBULATORY_CARE_PROVIDER_SITE_OTHER): Payer: Medicare Other | Admitting: Family Medicine

## 2016-08-06 ENCOUNTER — Encounter: Payer: Self-pay | Admitting: Family Medicine

## 2016-08-06 VITALS — BP 110/76 | HR 68 | Ht 64.0 in

## 2016-08-06 DIAGNOSIS — M9988 Other biomechanical lesions of rib cage: Secondary | ICD-10-CM

## 2016-08-06 DIAGNOSIS — M940 Chondrocostal junction syndrome [Tietze]: Secondary | ICD-10-CM | POA: Diagnosis not present

## 2016-08-06 DIAGNOSIS — M999 Biomechanical lesion, unspecified: Secondary | ICD-10-CM

## 2016-08-06 NOTE — Assessment & Plan Note (Signed)
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 I do not believe there is any significant internal derangement of the shoulder for any significant cervical radiculopathy at this time. Discussed with patient to continue to stay active. We discussed icing regimen and home exercises. We discussed objective is to do in which ones to potentially avoid. Patient will continue the regimen that she had been doing previously but has been a little bit noncompliant. Patient has gabapentin but does not take it regularly. Follow-up again in 6 weeks.

## 2016-08-06 NOTE — Patient Instructions (Addendum)
Great to see you  Happy holidays!  Stay active Exercises 3 times a week.  2 tennis ball in a tube sock and lay  On them where head meets neck  Keep hands within peripheral vision.  Ice is your friend Continue the same regimen with the aleve and consider the same with the gabapentin if needed.  See me again in 6 weeks if not doing well otherwise every 3 months!

## 2016-08-06 NOTE — Assessment & Plan Note (Signed)
Decision today to treat with OMT was based on Physical Exam  After verbal consent patient was treated with muscle energy, FPR techniques in cervical thoracic and rib areas  Patient tolerated the procedure well with improvement in symptoms  Patient given exercises, stretches and lifestyle modifications  See medications in patient instructions if given  Patient will follow up in 6 weeks

## 2016-12-26 LAB — HM MAMMOGRAPHY

## 2017-01-12 ENCOUNTER — Encounter: Payer: Self-pay | Admitting: Family Medicine

## 2017-01-20 NOTE — Progress Notes (Signed)
Nichole ScaleZach Jaslin Berg D.O.  Sports Medicine [address] [place], [name] [address] Phone: [phone] Subjective:     CC: left wrist pain   XLK:[id number]:Subjective  Nichole BlitzCarol Berg is a 71 y.o. female coming in with complaint of left wrist pain. Patient states that his been going on since she has changed her sling with call. Rates the severity of pain as 5 out of 10. Seems to be slowly worsening. Not stopping her from activity until. States that certain motions causes a severe pain. Patient has been able to continue to play golf as well as do daily activities. Denies any nighttime awakening but states that it can be sore. Seems to be around patient's left thumb she states but just proximal. Denies any numbness. Denies any weakness.     Past Medical History:  Diagnosis Date  . COLONIC POLYPS, HX OF 10/12/2009  . HYPERLIPIDEMIA 05/31/2007  . NEPHROLITHIASIS, HX OF 05/31/2007   Past Surgical History:  Procedure Laterality Date  . CATARACT EXTRACTION, BILATERAL     Social History   Social History  . Marital status: Single    Spouse name: N/A  . Number of children: N/A  . Years of education: N/A   Social History Main Topics  . Smoking status: Former Smoker    Years: 2.00    Types: Cigarettes    Quit date: 09/08/1964  . Smokeless tobacco: Not on file  . Alcohol use Yes  . Drug use: No  . Sexual activity: Not on file   Other Topics Concern  . 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 Sulfonamide Derivatives    Family History  Problem Relation Age of Onset  . Atrial fibrillation Mother        Has pacer doing very well age 73    Past medical history, social, surgical and family history all reviewed in electronic medical record.  No pertanent information unless stated regarding to the chief complaint.   Review of Systems:Review of systems updated and as  accurate as of 01/20/17  No headache, visual changes, nausea, vomiting, diarrhea, constipation, dizziness, abdominal pain, skin rash, fevers, chills, night sweats, weight loss, swollen lymph nodes, body aches, joint swelling, muscle aches, chest pain, shortness of breath, mood changes.   Objective  There were no vitals taken for this visit. Systems examined below as of 01/20/17   General: No apparent distress alert and oriented x3 mood and affect normal, dressed appropriately.  HEENT: Pupils equal, extraocular movements intact  Respiratory: Patient's speak in full sentences and does not appear short of breath  Cardiovascular: No lower extremity edema, non tender, no erythema  Skin: Warm dry intact with no signs of infection or rash on extremities or on axial skeleton.  Abdomen: Soft nontender  Neuro: Cranial nerves II through XII are intact, neurovascularly intact in all extremities with 2+ DTRs and 2+ pulses.  Lymph: No lymphadenopathy of posterior or anterior cervical chain or axillae bilaterally.  Gait normal with good balance and coordination.  MSK:  Non tender with full range of motion and good stability and symmetric strength and tone of shoulders, elbows, , hip, knee and ankles bilaterally. Arthritic changes of multiple joints Wrist: Left Inspection normal with no visible erythema or swelling. ROM smooth and normal with good flexion and extension and ulnar/radial  deviation that is symmetrical with opposite wrist. Mild discomfort more just proximal to the [facility] joint but no pain with grind test No snuffbox tenderness. No tenderness over Canal of Guyon. Strength 5/5 in all directions without pain. Negative Finkelstein, tinel's and phalens. Negative Watson's test. Contralateral wrist unremarkable   MSK US performed of: Left wrist This study was ordered, performed, and interpreted by Terrilee Files D.O.  Wrist: All extensor compartments visualized and tendons all normal in appearance  without fraying, tears, or sheath effusions. Patient dome on the flexor aspect just proximal to the [facility] joint does show a possible cystic formation. This is just deep to the radial artery. Calcific changes within the area. Increasing Doppler flow surrounding the area but no increasing neovascularization. Measures 0.56 cm in diameter.  IMPRESSION:  Abnormal cyst with calcific changes on the radial aspect of the left wrist  Procedure note 97110; 15 minutes spent for Therapeutic exercises as stated in above notes.  This included exercises focusing on stretching, strengthening, with significant focus on eccentric aspects. Flexion and extension exercises working on abduction as well as eccentric for muscle strengthening. Supination pronation also encouraged.  Proper technique shown and discussed handout in great detail with ATC.  All questions were discussed and answered.       Impression and Recommendations:     This case required medical decision making of moderate complexity.      Note: This dictation was prepared with Dragon dictation along with smaller phrase technology. Any transcriptional errors that result from this process are unintentional.

## 2017-01-21 ENCOUNTER — Ambulatory Visit: Payer: Self-pay

## 2017-01-21 ENCOUNTER — Ambulatory Visit (INDEPENDENT_AMBULATORY_CARE_PROVIDER_SITE_OTHER): Payer: Medicare Other | Admitting: Family Medicine

## 2017-01-21 ENCOUNTER — Ambulatory Visit (INDEPENDENT_AMBULATORY_CARE_PROVIDER_SITE_OTHER)
Admission: RE | Admit: 2017-01-21 | Discharge: 2017-01-21 | Disposition: A | Discharge status: Home / Self Care | Payer: Medicare Other | Source: Ambulatory Visit | Attending: Family Medicine | Admitting: Family Medicine

## 2017-01-21 VITALS — BP 110/82 | HR 68 | Ht 64.0 in | Wt 132.0 lb

## 2017-01-21 DIAGNOSIS — M25532 Pain in left wrist: Secondary | ICD-10-CM | POA: Diagnosis not present

## 2017-01-21 DIAGNOSIS — M71332 Other bursal cyst, left wrist: Secondary | ICD-10-CM | POA: Diagnosis not present

## 2017-01-21 NOTE — Assessment & Plan Note (Signed)
Patient did have a cyst. Seems to be space occupying. Could be causing some of the difficulty. We discussed icing regimen and home exercises. We discussed which activities to do a which ones to avoid. Patient will start to increase activity as tolerated. Bracing at night. Work with Event organiserathletic trainer. Follow-up again in 4 weeks. We'll measure against picture not increasing in size. Could be a candidate for aspiration.

## 2017-01-21 NOTE — Patient Instructions (Signed)
good to see you  Xray downstairs today  Ice is your friend.  Wear brace day and night for 1 week then nightly for 2 weeks.  Go back to regular grip  Ibuprofen when you need it.  See me again in 3-6 weeks to look at the size of the cyst

## 2017-02-25 ENCOUNTER — Ambulatory Visit (INDEPENDENT_AMBULATORY_CARE_PROVIDER_SITE_OTHER): Payer: Medicare Other | Admitting: Family Medicine

## 2017-02-25 ENCOUNTER — Encounter: Payer: Self-pay | Admitting: Family Medicine

## 2017-02-25 VITALS — BP 110/80 | HR 60 | Ht 65.0 in | Wt 138.0 lb

## 2017-02-25 DIAGNOSIS — M71332 Other bursal cyst, left wrist: Secondary | ICD-10-CM | POA: Diagnosis not present

## 2017-02-25 DIAGNOSIS — M9988 Other biomechanical lesions of rib cage: Secondary | ICD-10-CM

## 2017-02-25 DIAGNOSIS — M999 Biomechanical lesion, unspecified: Secondary | ICD-10-CM

## 2017-02-25 DIAGNOSIS — M47812 Spondylosis without myelopathy or radiculopathy, cervical region: Secondary | ICD-10-CM | POA: Diagnosis not present

## 2017-02-25 NOTE — Patient Instructions (Signed)
Prilosec daily for 2 weeks maybe Have a great summer See me again in 2-3 months!

## 2017-02-25 NOTE — Progress Notes (Signed)
[name] [address] [place], [place] [address] Phone: [phone] Subjective:     CC: left wrist pain f/u  [id number]  Nichole Berg is a 71 y.o. female coming in with complaint of left wrist pain. Found to have more of a calcific cyst the wrist. States that she is feeling 75-85% better. Doing repetitive activities seem to still cause some discomfort but nothing severe. Able to do daily activities and is even coughing on a regular basis  Patient still having some mild upper back and rib pain. Very minimal overall. Has responded well to manipulation previously. No radiation down the arm or any numbness or tingling.     Past Medical History:  Diagnosis Date  . COLONIC POLYPS, HX OF 10/12/2009  . HYPERLIPIDEMIA 05/31/2007  . NEPHROLITHIASIS, HX OF 05/31/2007   Past Surgical History:  Procedure Laterality Date  . CATARACT EXTRACTION, BILATERAL     Social History   Social History  . Marital status: Single    Spouse name: N/A  . Number of children: N/A  . Years of education: N/A   Social History Main Topics  . Smoking status: Former Smoker    Years: 2.00    Types: Cigarettes    Quit date: 09/08/1964  . Smokeless tobacco: None  . Alcohol use Yes  . Drug use: No  . Sexual activity: Not Asked   Other Topics Concern  . 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 Sulfonamide Derivatives    Family History  Problem Relation Age of Onset  . Atrial fibrillation Mother        Has pacer doing very well age 73    Past medical history, social, surgical and family history all reviewed in electronic medical record.  No pertanent information unless stated regarding to the chief complaint.   Review of Systems: No headache, visual changes, nausea, vomiting, diarrhea, constipation, dizziness, abdominal pain, skin rash, fevers,  chills, night sweats, weight loss, swollen lymph nodes, body aches, joint swelling,  chest pain, shortness of breath, mood changes. Positive muscle aches   Objective  Blood pressure 110/80, pulse 60, height 5\' 5"  (1.651 m), weight 138 lb (62.6 kg).   Systems examined below as of 02/25/17 General: NAD A&O x3 mood, affect normal  HEENT: Pupils equal, extraocular movements intact no nystagmus Respiratory: not short of breath at rest or with speaking Cardiovascular: No lower extremity edema, non tender Skin: Warm dry intact with no signs of infection or rash on extremities or on axial skeleton. Abdomen: Soft nontender, no masses Neuro: Cranial nerves  intact, neurovascularly intact in all extremities with 2+ DTRs and 2+ pulses. Lymph: No lymphadenopathy appreciated today  Gait normal with good balance and coordination.  MSK: Non tender with full range of motion and good stability and symmetric strength and tone of shoulders, elbows, wrist,  knee hips and ankles bilaterally.   MSK:  Non tender with full range of motion and good stability and symmetric strength and tone of shoulders, elbows, , hip, knee and ankles bilaterally. Arthritic changes of multiple joints Wrist: Left Inspection normal with no visible erythema or swelling. ROM smooth and normal with good flexion and extension and ulnar/radial deviation that is symmetrical with opposite wrist. Very mild discomfort over the [facility] joint but improved from previous  exam No snuffbox tenderness. No tenderness over Canal of Guyon. Strength 5/5 in all directions without pain. Negative Finkelstein, tinel's and phalens. Negative Watson's test. Contralateral wrist unremarkable  Neck: Inspection unremarkable. No palpable stepoffs. Negative Spurling's maneuver. Limited range of motion lacking the last 5 of extension last 10 of side bending Grip strength and sensation normal in bilateral hands Strength good C4 to T1 distribution No sensory  change to C4 to T1 Negative Hoffman sign bilaterally Reflexes normal  Osteopathic findings C2 flexed rotated and side bent right C5 flexed rotated and side bent left T3 extended rotated and side bent right inhaled third rib T9 extended rotated and side bent left    Impression and Recommendations:     This case required medical decision making of moderate complexity.      Note: This dictation was prepared with Dragon dictation along with smaller phrase technology. Any transcriptional errors that result from this process are unintentional.

## 2017-02-25 NOTE — Assessment & Plan Note (Signed)
Decision today to treat with OMT was based on Physical Exam  After verbal consent patient was treated with HVLA, ME, FPR techniques in cervical, thoracic, rib areas  Patient tolerated the procedure well with improvement in symptoms  Patient given exercises, stretches and lifestyle modifications  See medications in patient instructions if given  Patient will follow up in 4 weeks 

## 2017-02-25 NOTE — Assessment & Plan Note (Signed)
Stable overall. Responding well to manipulation again today. We discussed icing regimen. Discussed home exercises. Discussed posture and ergonomics. Follow-up again in 2-3 months.

## 2017-02-25 NOTE — Assessment & Plan Note (Signed)
Doing well. Continue conservative therapy. Worsening symptoms come back sooner.

## 2017-03-04 ENCOUNTER — Encounter: Payer: Medicare Other | Admitting: Internal Medicine

## 2017-05-22 NOTE — Progress Notes (Signed)
Chief Complaint  Patient presents with  . Annual Exam    Discuss shingles vaccine.     HPI: Nichole Berg 71 y.o. comes in today for Preventive Medicare exam/ wellness visit .Since last visit. healthy   Mom died February 11, 1994    Still hard.   Suddenly  .  Poss  Cardiac.   Health Maintenance  Topic Date Due  . INFLUENZA VACCINE  12/06/2017 (Originally 04/08/2017)  . Hepatitis C Screening  05/25/2018 (Originally 25-Jan-1946)  . MAMMOGRAM  12/27/2018  . TETANUS/TDAP  10/13/2019  . COLONOSCOPY  12/21/2021  . DEXA SCAN  Completed  . PNA vac Low Risk Adult  Completed   Health Maintenance Review LIFESTYLE:  Exercise:    Walking  Mostly  Yoga and golf  pickle ball  Tobacco/ETS: no Alcohol:  Wine 1-2  Sugar beverages:   no Sleep: 7  Or so .  Drug use: no    HH:  2   [name]  1 cat  Gets skin chek    Hearing:  Good    Vision:  No limitations at present . Last eye check UTD  Sp cataracts   Safety:  Has smoke detector and wears seat belts.  No firearms. No excess sun exposure. Sees dentist regularly.  Falls:   no  Memory: Felt to be good  , no concern from her or her family.  Depression: No anhedonia unusual crying or depressive symptoms  Nutrition: Eats well balanced diet; adequate calcium and vitamin D. No swallowing chewing problems.  Injury: no major injuries in the last six months.  Other healthcare providers:  Reviewed today .  Preventive parameters: up-to-date  Reviewed   ADLS:   There are no problems or need for assistance  driving, feeding, obtaining food, dressing, toileting and bathing, managing money using phone. She is independent.   ROS:  GEN/ HEENT: No fever, significant weight changes sweats headaches vision problems hearing changes, CV/ PULM; No chest pain shortness of breath cough, syncope,edema  change in exercise tolerance. GI /GU: No adominal pain, vomiting, change in bowel habits. No blood in the stool. No significant GU symptoms. SKIN/HEME: ,no acute skin  rashes suspicious lesions or bleeding. No lymphadenopathy, nodules, masses.  NEURO/ PSYCH:  No neurologic signs such as weakness numbness. No depression anxiety. IMM/ Allergy: No unusual infections.  Allergy .   REST of 12 system review negative except as per HPI   Past Medical History:  Diagnosis Date  . Cataract 10/18/2011   Followed by opthalmology.   . COLONIC POLYPS, HX OF 10/12/2009  . HYPERLIPIDEMIA 05/31/2007  . NEPHROLITHIASIS, HX OF 05/31/2007    Family History  Problem Relation Age of Onset  . Atrial fibrillation Mother        Has pacer doing very well age 75 passed suddnely age 45 poss CV    Social History   Social History  . Marital status: Single    Spouse name: N/A  . Number of children: N/A  . Years of education: N/A   Social History Main Topics  . Smoking status: Former Smoker    Years: 2.00    Types: Cigarettes    Quit date: 09/08/1964  . Smokeless tobacco: None  . Alcohol use Yes  . Drug use: No  . Sexual activity: Not Asked   Other Topics Concern  . 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 Cholecalciferol (VITAMIN D) 2000 UNITS tablet Take 2,000 Units by mouth daily.    Marland Kitchen lactobacillus rhamnosus, GG, (CULTURELLE) 10 B CELL capsule Take 1 capsule by mouth daily.    . Misc Natural Products (TART CHERRY ADVANCED PO) Take 800 mg by mouth daily.  . Multiple Vitamins-Minerals (ICAPS) CAPS Take by mouth.  . Zoster Vac Recomb Adjuvanted [facility]) injection Inject 0.5 mLs into the muscle once. Repeat in 2-6 months   No facility-administered encounter medications on file as of 05/25/2017.     EXAM:  BP 98/64 (BP Location: Right Arm, Patient Position: Sitting, Cuff Size: Normal)   Pulse 63   Temp 98 F (36.7 C) (Oral)   Ht 5' 3"  (1.6 m)   Wt 140 lb 8 oz (63.7 kg)    BMI 24.89 kg/m   Body mass index is 24.89 kg/m.  Physical Exam: Vital signs reviewed [id number] is a well-developed well-nourished alert cooperative   who appears stated age in no acute distress.  HEENT: normocephalic atraumatic , Eyes: PERRL EOM's full, conjunctiva clear, Nares: paten,t no deformity discharge or tenderness., Ears: no deformity EAC's clear TMs with normal landmarks. Mouth: clear OP, no lesions, edema.  Moist mucous membranes. Dentition in adequate repair. NECK: supple without masses, thyromegaly or bruits. CHEST/PULM:  Clear to auscultation and percussion breath sounds equal no wheeze , rales or rhonchi. No chest wall deformities or tenderness. CV: PMI is nondisplaced, S1 S2 no gallops, murmurs, rubs. Peripheral pulses are full without delay.No JVD . Breast: normal by inspection . No dimpling, discharge, masses, tenderness or discharge . ABDOMEN: Bowel sounds normal nontender  No guard or rebound, no hepato splenomegal no CVA tenderness.   Extremtities:  No clubbing cyanosis or edema, no acute joint swelling or redness no focal atrophy NEURO:  Oriented x3, cranial nerves 3-12 appear to be intact, no obvious focal weakness,gait within normal limits no abnormal reflexes or asymmetrical SKIN: No acute rashes normal turgor, color, no bruising or petechiae. freckling  PSYCH: Oriented, good eye contact, no obvious depression anxiety, cognition and judgment appear normal. LN: no cervical axillary inguinal adenopathy No noted deficits in memory, attention, and speech.   Lab Results  Component Value Date   WBC 4.0 02/06/2016   HGB 12.6 02/06/2016   HCT 38.0 02/06/2016   PLT 270.0 02/06/2016   GLUCOSE 84 02/06/2016   CHOL 239 (H) 02/06/2016   TRIG 66.0 02/06/2016   HDL 71.40 02/06/2016   LDLDIRECT 160.3 10/13/2011   LDLCALC 154 (H) 02/06/2016   ALT 14 02/06/2016   AST 23 02/06/2016   NA 137 02/06/2016   K 4.0 02/06/2016   CL 102 02/06/2016   CREATININE 0.66 02/06/2016    BUN 15 02/06/2016   CO2 29 02/06/2016   TSH 1.41 02/06/2016   HGBA1C 5.6 06/16/2014    ASSESSMENT AND PLAN:  Discussed the following assessment and plan:  Visit for preventive health examination  Hyperlipidemia, unspecified hyperlipidemia type - Plan: Lipid panel, Hepatitis C antibody, Basic metabolic panel, Hemoglobin A1c  At risk for hyperglycemia - Plan: Lipid panel, Hepatitis C antibody, Basic metabolic panel, Hemoglobin A1c  Osteopenia, unspecified location - Plan: Lipid panel, Hepatitis C antibody, Basic metabolic panel, Hemoglobin A1c  Need for hepatitis C screening test - Plan: Hepatitis C antibody Decline .  Flu today will get later  shingrix rx given  Disc  Healthy lifestyle to continue preventive parameters discussed. Uncertain if she should get every 10 year colonoscopy versus 5 based on her history. Doesn't appear that she is high risk. Hepatitis Con[place] risk is birth cohort. History of an elevated A1c at one point  Check screening today  She is fasting  Patient Care Team: Vitaly Wanat, Standley Brooking, MD as PCP - General Richmond Campbell, MD as Consulting Physician (Gastroenterology) Shon Hough, MD as Consulting Physician (Ophthalmology)  Patient Instructions  Last dexa 2016  Can repeat in 1-3 years  shingrix vaccine    At pharmacy.  Better immune response than live vaccine. Condolences  On passing of you MOM .    Preventive Care 61 Years and Older, Female Preventive care refers to lifestyle choices and visits with your health care provider that can promote health and wellness. What does preventive care include?  A yearly physical exam. This is also called an annual well check.  Dental exams once or twice a year.  Routine eye exams. Ask your health care provider how often you should have your eyes checked.  Personal lifestyle choices, including: ? Daily care of your teeth and gums. ? Regular physical activity. ? Eating a healthy diet. ? Avoiding tobacco and  drug use. ? Limiting alcohol use. ? Practicing safe sex. ? Taking low-dose aspirin every day. ? Taking vitamin and mineral supplements as recommended by your health care provider. What happens during an annual well check? The services and screenings done by your health care provider during your annual well check will depend on your age, overall health, lifestyle risk factors, and family history of disease. Counseling Your health care provider may ask you questions about your:  Alcohol use.  Tobacco use.  Drug use.  Emotional well-being.  Home and relationship well-being.  Sexual activity.  Eating habits.  History of falls.  Memory and ability to understand (cognition).  Work and work Statistician.  Reproductive health.  Screening You may have the following tests or measurements:  Height, weight, and BMI.  Blood pressure.  Lipid and cholesterol levels. These may be checked every 5 years, or more frequently if you are over 59 years old.  Skin check.  Lung cancer screening. You may have this screening every year starting at age 44 if you have a 30-pack-year history of smoking and currently smoke or have quit within the past 15 years.  Fecal occult blood test (FOBT) of the stool. You may have this test every year starting at age 73.  Flexible sigmoidoscopy or colonoscopy. You may have a sigmoidoscopy every 5 years or a colonoscopy every 10 years starting at age 7.  Hepatitis C blood test.  Hepatitis B blood test.  Sexually transmitted disease (STD) testing.  Diabetes screening. This is done by checking your blood sugar (glucose) after you have not eaten for a while (fasting). You may have this done every 1-3 years.  Bone density scan. This is done to screen for osteoporosis. You may have this done starting at age 15.  Mammogram. This may be done every 1-2 years. Talk to your health care provider about how often you should have regular mammograms.  Talk with  your health care provider about your test results, treatment options, and if necessary, the need for more tests. Vaccines Your health care provider may recommend certain vaccines, such as:  Influenza vaccine. This is recommended every year.  Tetanus, diphtheria, and acellular pertussis (Tdap, Td) vaccine. You may need a Td booster every 10 years.  Varicella vaccine. You may need this if you have not been vaccinated.  Zoster vaccine. You may need this after age 41.  Measles, mumps, and rubella (MMR) vaccine. You may need at least one dose of MMR if you were born in 1957 or later. You may also need a second dose.  Pneumococcal 13-valent conjugate (PCV13) vaccine. One dose is recommended after age 84.  Pneumococcal polysaccharide (PPSV23) vaccine. One dose is recommended after age 66.  Meningococcal vaccine. You may need this if you have certain conditions.  Hepatitis A vaccine. You may need this if you have certain conditions or if you travel or work in places where you may be exposed to hepatitis A.  Hepatitis B vaccine. You may need this if you have certain conditions or if you travel or work in places where you may be exposed to hepatitis B.  Haemophilus influenzae type b (Hib) vaccine. You may need this if you have certain conditions.  Talk to your health care provider about which screenings and vaccines you need and how often you need them. This information is not intended to replace advice given to you by your health care provider. Make sure you discuss any questions you have with your health care provider. Document Released: 09/21/2015 Document Revised: 05/14/2016 Document Reviewed: 06/26/2015 Elsevier Interactive Patient Education  2017 Ware K. Nickole Adamek M.D.

## 2017-05-25 ENCOUNTER — Ambulatory Visit (INDEPENDENT_AMBULATORY_CARE_PROVIDER_SITE_OTHER): Payer: Medicare Other | Admitting: Internal Medicine

## 2017-05-25 ENCOUNTER — Encounter: Payer: Self-pay | Admitting: Internal Medicine

## 2017-05-25 VITALS — BP 98/64 | HR 63 | Temp 98.0°F | Ht 63.0 in | Wt 140.5 lb

## 2017-05-25 DIAGNOSIS — E785 Hyperlipidemia, unspecified: Secondary | ICD-10-CM

## 2017-05-25 DIAGNOSIS — Z0000 Encounter for general adult medical examination without abnormal findings: Secondary | ICD-10-CM

## 2017-05-25 DIAGNOSIS — Z1159 Encounter for screening for other viral diseases: Secondary | ICD-10-CM | POA: Diagnosis not present

## 2017-05-25 DIAGNOSIS — M8580 Other specified disorders of bone density and structure, unspecified site: Secondary | ICD-10-CM | POA: Diagnosis not present

## 2017-05-25 DIAGNOSIS — Z9189 Other specified personal risk factors, not elsewhere classified: Secondary | ICD-10-CM | POA: Diagnosis not present

## 2017-05-25 LAB — BASIC METABOLIC PANEL
BUN: 10 mg/dL (ref 6–23)
CHLORIDE: 102 meq/L (ref 96–112)
CO2: 30 meq/L (ref 19–32)
CREATININE: 0.63 mg/dL (ref 0.40–1.20)
Calcium: 9.1 mg/dL (ref 8.4–10.5)
GFR: 98.3 mL/min (ref >60.00)
GLUCOSE: 88 mg/dL (ref 70–99)
Potassium: 4.3 mEq/L (ref 3.5–5.1)
Sodium: 138 mEq/L (ref 135–145)

## 2017-05-25 LAB — LIPID PANEL
CHOLESTEROL: 273 mg/dL — AB (ref 0–200)
HDL: 85 mg/dL (ref >39.00)
LDL Cholesterol: 174 mg/dL — ABNORMAL HIGH (ref 0–99)
NONHDL: 188.14
Total CHOL/HDL Ratio: 3
Triglycerides: 71 mg/dL (ref 0.0–149.0)
VLDL: 14.2 mg/dL (ref 0.0–40.0)

## 2017-05-25 LAB — HEMOGLOBIN A1C: Hgb A1c MFr Bld: 5.5 % (ref 4.6–6.5)

## 2017-05-25 MED ORDER — ZOSTER VAC RECOMB ADJUVANTED 50 MCG/0.5ML IM SUSR: 0.5000 mL | Freq: Once | INTRAMUSCULAR | 1 refills | Status: AC

## 2017-05-25 NOTE — Patient Instructions (Addendum)
Last dexa 2016  Can repeat in 1-3 years  shingrix vaccine    At pharmacy.  Better immune response than live vaccine. Condolences  On passing of you MOM .    Preventive Care 50 Years and Older, Female Preventive care refers to lifestyle choices and visits with your health care provider that can promote health and wellness. What does preventive care include?  A yearly physical exam. This is also called an annual well check.  Dental exams once or twice a year.  Routine eye exams. Ask your health care provider how often you should have your eyes checked.  Personal lifestyle choices, including: ? Daily care of your teeth and gums. ? Regular physical activity. ? Eating a healthy diet. ? Avoiding tobacco and drug use. ? Limiting alcohol use. ? Practicing safe sex. ? Taking low-dose aspirin every day. ? Taking vitamin and mineral supplements as recommended by your health care provider. What happens during an annual well check? The services and screenings done by your health care provider during your annual well check will depend on your age, overall health, lifestyle risk factors, and family history of disease. Counseling Your health care provider may ask you questions about your:  Alcohol use.  Tobacco use.  Drug use.  Emotional well-being.  Home and relationship well-being.  Sexual activity.  Eating habits.  History of falls.  Memory and ability to understand (cognition).  Work and work Statistician.  Reproductive health.  Screening You may have the following tests or measurements:  Height, weight, and BMI.  Blood pressure.  Lipid and cholesterol levels. These may be checked every 5 years, or more frequently if you are over 82 years old.  Skin check.  Lung cancer screening. You may have this screening every year starting at age 36 if you have a 30-pack-year history of smoking and currently smoke or have quit within the past 15 years.  Fecal occult blood test  (FOBT) of the stool. You may have this test every year starting at age 32.  Flexible sigmoidoscopy or colonoscopy. You may have a sigmoidoscopy every 5 years or a colonoscopy every 10 years starting at age 13.  Hepatitis C blood test.  Hepatitis B blood test.  Sexually transmitted disease (STD) testing.  Diabetes screening. This is done by checking your blood sugar (glucose) after you have not eaten for a while (fasting). You may have this done every 1-3 years.  Bone density scan. This is done to screen for osteoporosis. You may have this done starting at age 58.  Mammogram. This may be done every 1-2 years. Talk to your health care provider about how often you should have regular mammograms.  Talk with your health care provider about your test results, treatment options, and if necessary, the need for more tests. Vaccines Your health care provider may recommend certain vaccines, such as:  Influenza vaccine. This is recommended every year.  Tetanus, diphtheria, and acellular pertussis (Tdap, Td) vaccine. You may need a Td booster every 10 years.  Varicella vaccine. You may need this if you have not been vaccinated.  Zoster vaccine. You may need this after age 57.  Measles, mumps, and rubella (MMR) vaccine. You may need at least one dose of MMR if you were born in 1957 or later. You may also need a second dose.  Pneumococcal 13-valent conjugate (PCV13) vaccine. One dose is recommended after age 61.  Pneumococcal polysaccharide (PPSV23) vaccine. One dose is recommended after age 33.  Meningococcal vaccine. You may  need this if you have certain conditions.  Hepatitis A vaccine. You may need this if you have certain conditions or if you travel or work in places where you may be exposed to hepatitis A.  Hepatitis B vaccine. You may need this if you have certain conditions or if you travel or work in places where you may be exposed to hepatitis B.  Haemophilus influenzae type b  (Hib) vaccine. You may need this if you have certain conditions.  Talk to your health care provider about which screenings and vaccines you need and how often you need them. This information is not intended to replace advice given to you by your health care provider. Make sure you discuss any questions you have with your health care provider. Document Released: 09/21/2015 Document Revised: 05/14/2016 Document Reviewed: 06/26/2015 Elsevier Interactive Patient Education  2017 [employer].

## 2017-05-26 LAB — HEPATITIS C ANTIBODY
Hepatitis C Ab: NONREACTIVE
SIGNAL TO CUT-OFF: 0.01 (ref <1.00)

## 2017-05-29 ENCOUNTER — Encounter: Payer: Self-pay | Admitting: Internal Medicine

## 2018-02-10 NOTE — Progress Notes (Signed)
Nichole Berg, Nichole Berg Phone: [phone] Subjective:     CC: Left knee pain  [id number]  Nichole Berg is a 72 y.o. female coming in with left knee pain. She feels like she has been having pain on the outside of her hip down to the lateral side of the knee. In May, she walked 10 miles a day after 7 days. She also walked in some poor fitting shoes at a golf tournament which exacerbated her pain. Patient notes pain with hip flexion. She does feel like she is better but wants to make sure she is on the right path. Has had a massage which helped the hip.      Past Medical History:  Diagnosis Date  . Cataract 10/18/2011   Followed by opthalmology.   . COLONIC POLYPS, HX OF 10/12/2009  . HYPERLIPIDEMIA 05/31/2007  . NEPHROLITHIASIS, HX OF 05/31/2007   Past Surgical History:  Procedure Laterality Date  . CATARACT EXTRACTION, BILATERAL     Social History   Socioeconomic History  . Marital status: Single    Spouse name: Not on file  . Number of children: Not on file  . Years of education: Not on file  . Highest education level: Not on file  Occupational History  . Not on file  Social Needs  . Financial resource strain: Not on file  . Food insecurity:    Worry: Not on file    Inability: Not on file  . Transportation needs:    Medical: Not on file    Non-medical: Not on file  Tobacco Use  . Smoking status: Former Smoker    Years: 2.00    Types: Cigarettes    Last attempt to quit: 09/08/1964    Years since quitting: 53.4  . Smokeless tobacco: Never Used  Substance and Sexual Activity  . Alcohol use: Yes  . Drug use: No  . Sexual activity: Not on file  Lifestyle  . Physical activity:    Days per week: Not on file    Minutes per session: Not on file  . Stress: Not on file  Relationships  . Social connections:    Talks on phone: Not on file    Gets together: Not on file    Attends religious service: Not on file   Active member of club or organization: Not on file    Attends meetings of clubs or organizations: Not on file    Relationship status: Not on file  Other Topics Concern  . 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 Sulfonamide Derivatives    Family History  Problem Relation Age of Onset  . Atrial fibrillation Mother        Has pacer doing very well age 69 passed suddnely age 63 poss CV     Past medical history, social, surgical and family history all reviewed in electronic medical record.  No pertanent information unless stated regarding to the chief complaint.   Review of Systems:Review of systems updated and as accurate as of 02/10/18  No headache, visual changes, nausea, vomiting, diarrhea, constipation, dizziness, abdominal pain, skin rash, fevers, chills, night sweats, weight loss, swollen lymph nodes, body aches, joint swelling, muscle aches, chest pain, shortness of breath,  mood changes.   Objective  There were no vitals taken for this visit. Systems examined below as of 02/10/18   General: No apparent distress alert and oriented x3 mood and affect normal, dressed appropriately.  HEENT: Pupils equal, extraocular movements intact  Respiratory: Patient's speak in full sentences and does not appear short of breath  Cardiovascular: No lower extremity edema, non tender, no erythema  Skin: Warm dry intact with no signs of infection or rash on extremities or on axial skeleton.  Abdomen: Soft nontender  Neuro: Cranial nerves II through XII are intact, neurovascularly intact in all extremities with 2+ DTRs and 2+ pulses.  Lymph: No lymphadenopathy of posterior or anterior cervical chain or axillae bilaterally.  Gait normal with good balance and coordination.  MSK:  Non tender with full range of motion and good stability and symmetric  strength and tone of shoulders, elbows, wrist, hip, and ankles bilaterally.  Knee: Bilateral Mild valgus deformity noted.  Abnormal thigh to calf ratio.  Tender to palpation over pes anserine area ROM full in flexion and extension and lower leg rotation. instability with valgus force.  painful patellar compression. Patellar glide with moderate crepitus. Patellar and quadriceps tendons unremarkable. Hamstring and quadriceps strength is normal.   Back exam shows the patient does have some mild loss of lordosis.  Tightness of the hamstrings bilaterally left greater than right.  Mild positive Pearlean BrownieFaber on the left side.  Discomfort over the piriformis bilaterally.  Discomfort over the paraspinal musculature lumbar spine.  Near full range of motion of the back.  [id number]; 15 additional minutes spent for Therapeutic exercises as stated in above notes.  This included exercises focusing on stretching, strengthening, with significant focus on eccentric aspects.   Long term goals include an improvement in range of motion, strength, endurance as well as avoiding reinjury. Patient's frequency would include in 1-2 times a day, 3-5 times a week for a duration of 6-12 weeks. Low back exercises that included:  Pelvic tilt/bracing instruction to focus on control of the pelvic girdle and lower abdominal muscles  Glute strengthening exercises, focusing on proper firing of the glutes without engaging the low back muscles Proper stretching techniques for maximum relief for the hamstrings, hip flexors, low back and some rotation where tolerated   Proper technique shown and discussed handout in great detail with ATC.  All questions were discussed and answered.      Impression and Recommendations:     This case required medical decision making of moderate complexity.      Note: This dictation was prepared with Dragon dictation along with smaller phrase technology. Any transcriptional errors that result from this  process are unintentional.

## 2018-02-11 ENCOUNTER — Encounter: Payer: Self-pay | Admitting: Family Medicine

## 2018-02-11 ENCOUNTER — Ambulatory Visit: Payer: Medicare Other | Admitting: Family Medicine

## 2018-02-11 DIAGNOSIS — M5442 Lumbago with sciatica, left side: Secondary | ICD-10-CM | POA: Diagnosis not present

## 2018-02-11 DIAGNOSIS — G8929 Other chronic pain: Secondary | ICD-10-CM | POA: Diagnosis not present

## 2018-02-11 DIAGNOSIS — M545 Low back pain, unspecified: Secondary | ICD-10-CM | POA: Insufficient documentation

## 2018-02-11 DIAGNOSIS — M5441 Lumbago with sciatica, right side: Secondary | ICD-10-CM

## 2018-02-11 NOTE — Assessment & Plan Note (Signed)
I believe the pain in the legs is secondary to her back Patient with tight hamstrings secondary to more of her back and possible lumbar radiculopathy.  We discussed different medications with patient declined.  We discussed icing regimen.  Discussed over-the-counter medications that patient will do.  Home exercises given.  Discussed which activities to avoid.  Follow-up with me again 4-week

## 2018-02-11 NOTE — Patient Instructions (Signed)
Good to see you  Nichole Berg is your friend.  Thigh compression sleeve with a lot of activity and golf.  Keep up with the piriformis New hamstring exercises 3 times a week  Have your niece call [phone] and We will get her set up  See me again in 4-6 weeks if not perfect!

## 2018-03-18 ENCOUNTER — Ambulatory Visit: Payer: Medicare Other | Admitting: Family Medicine

## 2018-05-31 NOTE — Progress Notes (Signed)
[name] D.O. [name] [name], [name] [address] Phone: [phone] Subjective:    I Ronelle NighKana Berg am serving as a Neurosurgeonscribe for Dr. Antoine PrimasZachary Smith.   CC: Left shoulder pain  BJY:[id number]:Subjective  Nichole BlitzCarol Berg is a 72 y.o. female coming in with complaint of left shoulder pain. Has been exercising. Pain radiates down the arm. Left handed and plays a lot of golf. Medial elbow pain. Feels as if something is pinching. No numbness and tingling or neck pain noted.  Onset- 6 weeks  Location- left shoulder and medial elbow Character- Sharp pinch  Aggravating factors- ADL Reliving factors- Aleve, Ice  Therapies tried- Massage, chiropractor Severity-6 out of 10     Past Medical History:  Diagnosis Date  . Cataract 10/18/2011   Followed by opthalmology.   . COLONIC POLYPS, HX OF 10/12/2009  . HYPERLIPIDEMIA 05/31/2007  . NEPHROLITHIASIS, HX OF 05/31/2007   Past Surgical History:  Procedure Laterality Date  . CATARACT EXTRACTION, BILATERAL     Social History   Socioeconomic History  . Marital status: Single    Spouse name: Not on file  . Number of children: Not on file  . Years of education: Not on file  . Highest education level: Not on file  Occupational History  . Not on file  Social Needs  . Financial resource strain: Not on file  . Food insecurity:    Worry: Not on file    Inability: Not on file  . Transportation needs:    Medical: Not on file    Non-medical: Not on file  Tobacco Use  . Smoking status: Former Smoker    Years: 2.00    Types: Cigarettes    Last attempt to quit: 09/08/1964    Years since quitting: 53.7  . Smokeless tobacco: Never Used  Substance and Sexual Activity  . Alcohol use: Yes  . Drug use: No  . Sexual activity: Not on file  Lifestyle  . Physical activity:    Days per week: Not on file    Minutes per session: Not on file  . Stress: Not on file  Relationships  . Social connections:    Talks on phone: Not on file   Gets together: Not on file    Attends religious service: Not on file    Active member of club or organization: Not on file    Attends meetings of clubs or organizations: Not on file    Relationship status: Not on file  Other Topics Concern  . 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 Sulfonamide Derivatives    Family History  Problem Relation Age of Onset  . Atrial fibrillation Mother        Has pacer doing very well age 72 passed suddnely age 89 poss CV         Current Outpatient Medications (Other):  Marland Kitchen.  Cholecalciferol (VITAMIN D) 2000 UNITS tablet, Take 2,000 Units by mouth daily.   .  Misc Natural Products (TART CHERRY ADVANCED PO), Take 800 mg by mouth daily. .  Multiple Vitamins-Minerals (ICAPS) CAPS, Take by mouth. .  lactobacillus rhamnosus, GG, (CULTURELLE) 10 B CELL capsule, Take 1 capsule by mouth daily.   .  Vitamin D, Ergocalciferol, (DRISDOL) 50000 units CAPS  capsule, Take 1 capsule (50,000 Units total) by mouth every 7 (seven) days.    Past medical history, social, surgical and family history all reviewed in electronic medical record.  No pertanent information unless stated regarding to the chief complaint.   Review of Systems:  No headache, visual changes, nausea, vomiting, diarrhea, constipation, dizziness, abdominal pain, skin rash, fevers, chills, night sweats, weight loss, swollen lymph nodes, body aches, joint swelling, muscle aches, chest pain, shortness of breath, mood changes.   Objective  Blood pressure 110/60, pulse 61, height 5\' 3"  (1.6 m), weight 143 lb (64.9 kg), SpO2 97 %.   General: No apparent distress alert and oriented x3 mood and affect normal, dressed appropriately.  HEENT: Pupils equal, extraocular movements intact  Respiratory: Patient's speak in full sentences and does not appear short  of breath  Cardiovascular: No lower extremity edema, non tender, no erythema  Skin: Warm dry intact with no signs of infection or rash on extremities or on axial skeleton.  Abdomen: Soft nontender  Neuro: Cranial nerves II through XII are intact, neurovascularly intact in all extremities with 2+ DTRs and 2+ pulses.  Lymph: No lymphadenopathy of posterior or anterior cervical chain or axillae bilaterally.  Gait normal with good balance and coordination.  MSK:  Non tender with full range of motion and good stability and symmetric strength and tone of  elbows, wrist, hip, knee and ankles bilaterally.  Mild arthritic changes of multiple joints Shoulder: Left Inspection reveals no abnormalities, atrophy or asymmetry. Palpation is normal with no tenderness over AC joint or bicipital groove. ROM is limited 150 degrees of forward flexion, only 10 degrees of external rotation.  Internal rotation to L3 compared to T10 on the contralateral arm. Rotator cuff strength normal throughout. Mild impingement Speeds and Yergason's tests normal. No labral pathology noted with negative Obrien's, negative clunk and good stability. Normal scapular function observed. No painful arc and no drop arm sign. No apprehension sign Contralateral shoulder unremarkable  Procedure: Real-time Ultrasound Guided Injection of left glenohumeral joint Device: GE Logiq E  Ultrasound guided injection is preferred based studies that show increased duration, increased effect, greater accuracy, decreased procedural pain, increased response rate with ultrasound guided versus blind injection.  Verbal informed consent obtained.  Time-out conducted.  Noted no overlying erythema, induration, or other signs of local infection.  Skin prepped in a sterile fashion.  Local anesthesia: Topical Ethyl chloride.  With sterile technique and under real time ultrasound guidance:  Joint visualized.  21g 2 inch needle inserted posterior approach.  Pictures taken for needle placement. Patient did have injection of 2 cc of 0.5% Marcaine, and 1cc of Kenalog 40 mg/dL. Completed without difficulty  Pain immediately resolved suggesting accurate placement of the medication.  Advised to call if fevers/chills, erythema, induration, drainage, or persistent bleeding.  Images permanently stored and available for review in the ultrasound unit.  Impression: Technically successful ultrasound guided injection.     Impression and Recommendations:     This case required medical decision making of moderate complexity. The above documentation has been reviewed and is accurate and complete Judi Saa, DO       Note: This dictation was prepared with Dragon dictation along with smaller phrase technology. Any transcriptional errors that result from this process are unintentional.

## 2018-06-01 ENCOUNTER — Ambulatory Visit: Payer: Medicare Other | Admitting: Family Medicine

## 2018-06-01 ENCOUNTER — Encounter: Payer: Self-pay | Admitting: Family Medicine

## 2018-06-01 DIAGNOSIS — M7502 Adhesive capsulitis of left shoulder: Secondary | ICD-10-CM

## 2018-06-01 DIAGNOSIS — M7500 Adhesive capsulitis of unspecified shoulder: Secondary | ICD-10-CM | POA: Insufficient documentation

## 2018-06-01 MED ORDER — VITAMIN D (ERGOCALCIFEROL) 1.25 MG (50000 UNIT) PO CAPS: 50000.0000 [IU] | ORAL_CAPSULE | ORAL | 0 refills | Status: DC

## 2018-06-01 NOTE — Patient Instructions (Addendum)
Good to see you  Nichole Berg is your friend.Ice 20 minutes 2 times daily. Usually after activity and before bed. Exercises 3 times a week.  Keep the shoulder moving.  Once weekly vitamin D for 12 weeks pennsaid pinkie amount topically 2 times daily as needed.  See me again in 6 weeks

## 2018-06-01 NOTE — Assessment & Plan Note (Signed)
Patient given injection.  Tolerated the procedure well.  Discussed icing regimen and home exercises.  Range of motion in the importance.  Discussed vitamin D supplementation once weekly vitamin D prescribed.  Discussed icing regimen.  Follow-up again in 6 to 8 weeks

## 2018-06-23 NOTE — Progress Notes (Signed)
Ms. Marten received her flu shot at the [facility] by the undersigned to her RT deltoid. Lot # [id number] NDC: [id number] Mfg: [id number] Exp: 03/08/19

## 2018-07-12 ENCOUNTER — Ambulatory Visit: Payer: Medicare Other | Admitting: Family Medicine

## 2018-07-19 NOTE — Progress Notes (Signed)
[name] [address] [place], [place] [address] Phone: [phone] Subjective:    I [name] am serving as a Neurosurgeon for Dr. Antoine Primas.    CC: Left shoulder pain  ION:[id number]  Nichole Berg is a 72 y.o. female coming in with complaint of left shoulder pain. States the shoulder is doing well. Was waking up during the middle of the night with bilateral lower leg and feet cramps. Believes the vitamin d may have had something to do with it. Tried taking magnesium. Pain was nightly. Pain has stopped due to her discontinuing the vitamin d.   Overall would state that she is 90% better.     Past Medical History:  Diagnosis Date  . Cataract 10/18/2011   Followed by opthalmology.   . COLONIC POLYPS, HX OF 10/12/2009  . HYPERLIPIDEMIA 05/31/2007  . NEPHROLITHIASIS, HX OF 05/31/2007   Past Surgical History:  Procedure Laterality Date  . CATARACT EXTRACTION, BILATERAL     Social History   Socioeconomic History  . Marital status: Single    Spouse name: Not on file  . Number of children: Not on file  . Years of education: Not on file  . Highest education level: Not on file  Occupational History  . Not on file  Social Needs  . Financial resource strain: Not on file  . Food insecurity:    Worry: Not on file    Inability: Not on file  . Transportation needs:    Medical: Not on file    Non-medical: Not on file  Tobacco Use  . Smoking status: Former Smoker    Years: 2.00    Types: Cigarettes    Last attempt to quit: 09/08/1964    Years since quitting: 53.8  . Smokeless tobacco: Never Used  Substance and Sexual Activity  . Alcohol use: Yes  . Drug use: No  . Sexual activity: Not on file  Lifestyle  . Physical activity:    Days per week: Not on file    Minutes per session: Not on file  . Stress: Not on file  Relationships  . Social connections:    Talks on phone: Not on file    Gets together: Not on file    Attends religious service:  Not on file    Active member of club or organization: Not on file    Attends meetings of clubs or organizations: Not on file    Relationship status: Not on file  Other Topics Concern  . 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 Sulfonamide Derivatives    Family History  Problem Relation Age of Onset  . Atrial fibrillation Mother        Has pacer doing very well age 35 passed suddnely age 84 poss CV         Current Outpatient Medications (Other):  Marland Kitchen  Cholecalciferol (VITAMIN D) 2000 UNITS tablet, Take 2,000 Units by mouth daily.   Marland Kitchen  lactobacillus rhamnosus, GG, (CULTURELLE) 10 B CELL capsule, Take 1 capsule by mouth daily.   .  Misc Natural Products (TART CHERRY ADVANCED PO), Take 800 mg by mouth daily. .  Multiple Vitamins-Minerals (ICAPS) CAPS, Take by mouth. .  Vitamin D, Ergocalciferol, (DRISDOL) 50000 units CAPS capsule,  Take 1 capsule (50,000 Units total) by mouth every 7 (seven) days.    Past medical history, social, surgical and family history all reviewed in electronic medical record.  No pertanent information unless stated regarding to the chief complaint.   Review of Systems:  No headache, visual changes, nausea, vomiting, diarrhea, constipation, dizziness, abdominal pain, skin rash, fevers, chills, night sweats, weight loss, swollen lymph nodes, body aches, joint swelling,  chest pain, shortness of breath, mood changes.  Positive muscle aches  Objective  Blood pressure 130/78, pulse 61, height 5\' 3"  (1.6 m), weight 144 lb (65.3 kg), SpO2 97 %.    General: No apparent distress alert and oriented x3 mood and affect normal, dressed appropriately.  HEENT: Pupils equal, extraocular movements intact  Respiratory: Patient's speak in full sentences and does not appear short of breath  Cardiovascular: No lower extremity  edema, non tender, no erythema  Skin: Warm dry intact with no signs of infection or rash on extremities or on axial skeleton.  Abdomen: Soft nontender  Neuro: Cranial nerves II through XII are intact, neurovascularly intact in all extremities with 2+ DTRs and 2+ pulses.  Lymph: No lymphadenopathy of posterior or anterior cervical chain or axillae bilaterally.  Gait normal with good balance and coordination.  MSK:  Non tender with full range of motion and good stability and symmetric strength and tone of  elbows, wrist, hip, knee and ankles bilaterally.  Arthritic changes of multiple joints Shoulder exam shows very mild impingement still remaining but otherwise is significantly improved.   Impression and Recommendations:      The above documentation has been reviewed and is accurate and complete Judi Saa, DO       Note: This dictation was prepared with Dragon dictation along with smaller phrase technology. Any transcriptional errors that result from this process are unintentional.

## 2018-07-20 ENCOUNTER — Ambulatory Visit: Payer: Medicare Other | Admitting: Family Medicine

## 2018-07-20 ENCOUNTER — Encounter: Payer: Self-pay | Admitting: Family Medicine

## 2018-07-20 VITALS — BP 130/78 | HR 61 | Ht 63.0 in | Wt 144.0 lb

## 2018-07-20 DIAGNOSIS — M2550 Pain in unspecified joint: Secondary | ICD-10-CM | POA: Diagnosis not present

## 2018-07-20 DIAGNOSIS — M7502 Adhesive capsulitis of left shoulder: Secondary | ICD-10-CM

## 2018-07-20 DIAGNOSIS — R252 Cramp and spasm: Secondary | ICD-10-CM | POA: Diagnosis not present

## 2018-07-20 NOTE — Assessment & Plan Note (Signed)
Completely resolved after the injection and the home exercises.  Discussed with patient to continue to increase activity.  Does complain of some mild leg cramping.  Feels that his vitamin D and discontinued.  Discussed with patient at great length.  Patient will try home exercises, diet changes but will check a ferritin level to make sure that iron deficient next follow-up with primary care.

## 2018-07-20 NOTE — Assessment & Plan Note (Signed)
Leg cramping only at night.  Ferritin level future blood draw but otherwise should do well.  Intermittent at this point.  He did not feel it was secondary to the once weekly vitamin D and patient has discontinued

## 2018-07-27 ENCOUNTER — Encounter: Payer: Self-pay | Admitting: Family Medicine

## 2018-07-27 NOTE — Progress Notes (Signed)
Chief Complaint  Patient presents with  . Annual Exam  . Hyperlipidemia    HPI: Nichole Berg 72 y.o. comes in today for Preventive Medicare exam/ wellness visit .Since last visit. shoulder   Predicament better  But had high dose vit d  Developed  Nocturnal leg cramps   ? If better off med not every night  Not during day  . No claudication.   Advised poss check irone level  Dose have fam hx of  Noct leg cramps    Mom.   Avoiding statins at this time  No sx .  Health Maintenance  Topic Date Due  . MAMMOGRAM  12/27/2018  . TETANUS/TDAP  10/13/2019  . COLONOSCOPY  12/21/2021  . INFLUENZA VACCINE  Completed  . DEXA SCAN  Completed  . Hepatitis C Screening  Completed  . PNA vac Low Risk Adult  Completed   Health Maintenance Review LIFESTYLE:  Exercise:   Golf and the yand walking . And grandchildren .   Very active  Tobacco/ETS:no Alcohol:  2 per day  Sugar beverages: no Sleep:  Ok not soundly .   ocass iuse gaba 100 and sleeps well Drug use: no HH:  2 no pets   Hearing:  [place]:  No limitations at present . Last eye check UTD  Safety:  Has smoke detector and wears seat belts.  No firearms. No excess sun exposure. Sees dentist regularly.  Memory: Felt to be good  , no concern from her or her family.  Depression: No anhedonia unusual crying or depressive symptoms  Nutrition: Eats well balanced diet; adequate calcium and vitamin D. No swallowing chewing problems.  Other healthcare providers:  Reviewed today .  Preventive parameters: up-to-date  Reviewed   ADLS:   There are no problems or need for assistance  driving, feeding, obtaining food, dressing, toileting and bathing, managing money using phone. She is independent.   ROS:  GEN/ HEENT: No fever, significant weight changes sweats headaches vision problems hearing changes, CV/ PULM; No chest pain shortness of breath cough, syncope,edema  change in exercise tolerance. GI /GU: No adominal pain, vomiting,  change in bowel habits. No blood in the stool. No significant GU symptoms. SKIN/HEME: ,no acute skin rashes suspicious lesions or bleeding. No lymphadenopathy, nodules, masses.  NEURO/ PSYCH:  No neurologic signs such as weakness numbness. No depression anxiety. IMM/ Allergy: No unusual infections.  Allergy .   REST of 12 system review negative except as per HPI   Past Medical History:  Diagnosis Date  . Cataract 10/18/2011   Followed by opthalmology.   . COLONIC POLYPS, HX OF 10/12/2009  . HYPERLIPIDEMIA 05/31/2007  . NEPHROLITHIASIS, HX OF 05/31/2007    Family History  Problem Relation Age of Onset  . Atrial fibrillation Mother        Has pacer doing very well age 65 passed suddnely age 63 poss CV    Social History   Socioeconomic History  . Marital status: Single    Spouse name: Not on file  . Number of children: Not on file  . Years of education: Not on file  . Highest education level: Not on file  Occupational History  . Not on file  Social Needs  . Financial resource strain: Not on file  . Food insecurity:    Worry: Not on file    Inability: Not on file  . Transportation needs:    Medical: Not on file    Non-medical: Not on file  Tobacco Use  . Smoking status: Former Smoker    Years: 2.00    Types: Cigarettes    Last attempt to quit: 09/08/1964    Years since quitting: 53.9  . Smokeless tobacco: Never Used  Substance and Sexual Activity  . Alcohol use: Yes  . Drug use: No  . Sexual activity: Not on file  Lifestyle  . Physical activity:    Days per week: Not on file    Minutes per session: Not on file  . Stress: Not on file  Relationships  . Social connections:    Talks on phone: Not on file    Gets together: Not on file    Attends religious service: Not on file    Active member of club or organization: Not on file    Attends meetings of clubs or organizations: Not on file    Relationship status: Not on file  Other Topics Concern  . 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 Cholecalciferol (VITAMIN D) 2000 UNITS tablet Take 2,000 Units by mouth daily.    Marland Kitchen gabapentin (NEURONTIN) 100 MG capsule Take 2 capsules (200 mg total) by mouth at bedtime. (Patient taking differently: Take 100 mg by mouth at bedtime. )  . Misc Natural Products (TART CHERRY ADVANCED PO) Take 800 mg by mouth daily.  . Multiple Vitamins-Minerals (ICAPS) CAPS Take by mouth.  . [DISCONTINUED] lactobacillus rhamnosus, GG, (CULTURELLE) 10 B CELL capsule Take 1 capsule by mouth daily.     No facility-administered encounter medications on file as of 07/28/2018.     EXAM:  BP 102/60 (BP Location: Right Arm, Patient Position: Sitting, Cuff Size: Normal)   Pulse 61   Temp 98 F (36.7 C) (Oral)   Ht 5' 3.58" (1.615 m)   Wt 143 lb 11.2 oz (65.2 kg)   BMI 24.99 kg/m   Body mass index is 24.99 kg/m.  Physical Exam: Vital signs reviewed ZDG:LOVF is a well-developed well-nourished alert cooperative   who appears stated age in no acute distress.  HEENT: normocephalic atraumatic , Eyes: PERRL EOM's full, conjunctiva clear, Nares: paten,t no deformity discharge or tenderness., Ears: no deformity EAC's clear TMs with normal landmarks. Mouth: clear OP, no lesions, edema.  Moist mucous membranes. Dentition in adequate repair. NECK: supple without masses, thyromegaly or bruits. CHEST/PULM:  Clear to auscultation and percussion breath sounds equal no wheeze , rales or rhonchi. No chest wall deformities or tenderness.Breast: normal by inspection . No dimpling, discharge, masses, tenderness or discharge . CV: PMI is nondisplaced, S1 S2 no gallops, murmurs, rubs. Peripheral pulses are full without delay.No JVD .  ABDOMEN: Bowel sounds normal nontender  No guard or rebound, no hepato splenomegal no CVA  tenderness.   Extremtities:  No clubbing cyanosis or edema, no acute joint swelling or redness no focal atrophy NEURO:  Oriented x3, cranial nerves 3-12 appear to be intact, no obvious focal weakness,gait within normal limits no abnormal reflexes or asymmetrical SKIN: No acute rashes normal turgor, color, no bruising or petechiae. Sun changes  No acute findings PSYCH: Oriented, good eye contact, no obvious depression anxiety, cognition and judgment appear normal. LN: no cervical axillary inguinal adenopathy No noted deficits in memory, attention, and speech.  ASSESSMENT AND PLAN:  Discussed the following assessment and plan:  Visit for preventive health examination  Medication management - Plan: Basic metabolic panel, CBC with Differential/Platelet, Hepatic function panel, Lipid panel, TSH, Iron, TIBC and Ferritin Panel  Hyperlipidemia, unspecified hyperlipidemia type - SDM     disc  other  options to assess.  she can let us know if want to do cacscan or wiat depnding on labs  no other risk factors x age. - Plan: Basic metabolic panel, CBC with Differential/Platelet, Hepatic function panel, Lipid panel, TSH, Iron, TIBC and Ferritin Panel  Nocturnal leg cramps - Plan: Basic metabolic panel, CBC with Differential/Platelet, Hepatic function panel, Lipid panel, TSH, Iron, TIBC and Ferritin Panel The 10-year ASCVD risk score Mikey Bussing DC Jr., et al., 2013) is: 9.8%   Values used to calculate the score:     Age: 78 years     Sex: Female     Is Non-Hispanic African American: No     Diabetic: No     Tobacco smoker: No     Systolic Blood Pressure: 161 mmHg     Is BP treated: No     HDL Cholesterol: 85 mg/dL     Total Cholesterol: 273 mg/dL  Patient Care Team: Burnis Medin, MD as PCP - General Richmond Campbell, MD as Consulting Physician (Gastroenterology) Shon Hough, MD as Consulting Physician (Ophthalmology)  Patient Instructions  consider  coronary artery calcium scoring for  more risk assessment about  benefit  risk  of statin medication.   Will notify you  of labs when available.   Checking  iron levels   The 10-year ASCVD risk score Mikey Bussing DC Jr., et al., 2013) is: 9.8%   Values used to calculate the score:     Age: 43 years     Sex: Female     Is Non-Hispanic African American: No     Diabetic: No     Tobacco smoker: No     Systolic Blood Pressure: 096 mmHg     Is BP treated: No     HDL Cholesterol: 85 mg/dL     Total Cholesterol: 273 mg/dL    Preventive Care 65 Years and Older, Female Preventive care refers to lifestyle choices and visits with your health care provider that can promote health and wellness. What does preventive care include?  A yearly physical exam. This is also called an annual well check.  Dental exams once or twice a year.  Routine eye exams. Ask your health care provider how often you should have your eyes checked.  Personal lifestyle choices, including: ? Daily care of your teeth and gums. ? Regular physical activity. ? Eating a healthy diet. ? Avoiding tobacco and drug use. ? Limiting alcohol use. ? Practicing safe sex. ? Taking low-dose aspirin every day. ? Taking vitamin and mineral supplements as recommended by your health care provider. What happens during an annual well check? The services and screenings done by your health care provider during your annual well check will depend on your age, overall health, lifestyle risk factors, and family history of disease. Counseling Your health care provider may ask you questions about your:  Alcohol use.  Tobacco use.  Drug use.  Emotional well-being.  Home and relationship well-being.  Sexual activity.  Eating habits.  History of falls.  Memory and ability to understand (cognition).  Work and work Statistician.  Reproductive health.  Screening You may have the following tests or measurements:  Height, weight, and BMI.  Blood pressure.  Lipid and  cholesterol levels. These may be checked every 5 years, or more frequently if you are over 93 years old.  Skin check.  Lung cancer screening. You may have this screening every year starting at age 26 if you have a 30-pack-year history of smoking and currently smoke or have quit within the past 15 years.  Fecal occult blood test (FOBT) of the stool. You may have this test every year starting at age 63.  Flexible sigmoidoscopy or colonoscopy. You may have a sigmoidoscopy every 5 years or a colonoscopy every 10 years starting at age 54.  Hepatitis C blood test.  Hepatitis B blood test.  Sexually transmitted disease (STD) testing.  Diabetes screening. This is done by checking your blood sugar (glucose) after you have not eaten for a while (fasting). You may have this done every 1-3 years.  Bone density scan. This is done to screen for osteoporosis. You may have this done starting at age 19.  Mammogram. This may be done every 1-2 years. Talk to your health care provider about how often you should have regular mammograms.  Talk with your health care provider about your test results, treatment options, and if necessary, the need for more tests. Vaccines Your health care provider may recommend certain vaccines, such as:  Influenza vaccine. This is recommended every year.  Tetanus, diphtheria, and acellular pertussis (Tdap, Td) vaccine. You may need a Td booster every 10 years.  Varicella vaccine. You may need this if you have not been vaccinated.  Zoster vaccine. You may need this after age 72.  Measles, mumps, and rubella (MMR) vaccine. You may need at least one dose of MMR if you were born in 1957 or later. You may also need a second dose.  Pneumococcal 13-valent conjugate (PCV13) vaccine. One dose is recommended after age 69.  Pneumococcal polysaccharide (PPSV23) vaccine. One dose is recommended after age 26.  Meningococcal vaccine. You may need this if you have certain  conditions.  Hepatitis A vaccine. You may need this if you have certain conditions or if you travel or work in places where you may be exposed to hepatitis A.  Hepatitis B vaccine. You may need this if you have certain conditions or if you travel or work in places where you may be exposed to hepatitis B.  Haemophilus influenzae type b (Hib) vaccine. You may need this if you have certain conditions.  Talk to your health care provider about which screenings and vaccines you need and how often you need them. This information is not intended to replace advice given to you by your health care provider. Make sure you discuss any questions you have with your health care provider. Document Released: 09/21/2015 Document Revised: 05/14/2016 Document Reviewed: 06/26/2015 Elsevier Interactive Patient Education  2018 [employer].    Coronary Calcium Scan A coronary calcium scan is an imaging test used to look for deposits of calcium and other fatty materials (plaques) in the inner lining of the blood vessels of the heart (coronary arteries). These deposits of calcium and plaques can partly clog and narrow the coronary arteries without producing any symptoms or warning signs. This puts a person at risk for a heart attack. This test can detect these deposits before symptoms develop. Tell a health care provider about:  Any allergies you have.  All medicines you are taking, including vitamins, herbs, eye drops, creams, and over-the-counter medicines.  Any problems you or family members have had with anesthetic medicines.  Any blood disorders you have.  Any surgeries you have had.  Any medical conditions you have.  Whether you are pregnant or may be pregnant. What are the risks? Generally, this is a safe procedure. However, problems may occur, including:  Harm to a pregnant woman and her unborn baby. This test involves the use of radiation. Radiation exposure can be dangerous to a pregnant woman  and her unborn baby. If you are pregnant, you generally should not have this procedure done.  Slight increase in the risk of cancer. This is because of the radiation involved in the test.  What happens before the procedure? No preparation is needed for this procedure. What happens during the procedure?  You will undress and remove any jewelry around your neck or chest.  You will put on a hospital gown.  Sticky electrodes will be placed on your chest. The electrodes will be connected to an electrocardiogram (ECG) machine to record a tracing of the electrical activity of your heart.  A CT scanner will take pictures of your heart. During this time, you will be asked to lie still and hold your breath for 2-3 seconds while a picture of your heart is being taken. The procedure may vary among health care providers and hospitals. What happens after the procedure?  You can get dressed.  You can return to your normal activities.  It is up to you to get the results of your test. Ask your health care provider, or the department that is doing the test, when your results will be ready. Summary  A coronary calcium scan is an imaging test used to look for deposits of calcium and other fatty materials (plaques) in the inner lining of the blood vessels of the heart (coronary arteries).  Generally, this is a safe procedure. Tell your health care provider if you are pregnant or may be pregnant.  No preparation is needed for this procedure.  A CT scanner will take pictures of your heart.  You can return to your normal activities after the scan is done. This information is not intended to replace advice given to you by your health care provider. Make sure you discuss any questions you have with your health care provider. Document Released: 02/21/2008 Document Revised: 07/14/2016 Document Reviewed: 07/14/2016 Elsevier Interactive Patient Education  2017 [place] K. Sinclaire Artiga M.D.

## 2018-07-28 ENCOUNTER — Ambulatory Visit (INDEPENDENT_AMBULATORY_CARE_PROVIDER_SITE_OTHER): Payer: Medicare Other | Admitting: Internal Medicine

## 2018-07-28 ENCOUNTER — Other Ambulatory Visit: Payer: Self-pay

## 2018-07-28 ENCOUNTER — Encounter: Payer: Self-pay | Admitting: Internal Medicine

## 2018-07-28 VITALS — BP 102/60 | HR 61 | Temp 98.0°F | Ht 63.58 in | Wt 143.7 lb

## 2018-07-28 DIAGNOSIS — Z0000 Encounter for general adult medical examination without abnormal findings: Secondary | ICD-10-CM

## 2018-07-28 DIAGNOSIS — G4762 Sleep related leg cramps: Secondary | ICD-10-CM

## 2018-07-28 DIAGNOSIS — E785 Hyperlipidemia, unspecified: Secondary | ICD-10-CM

## 2018-07-28 DIAGNOSIS — Z79899 Other long term (current) drug therapy: Secondary | ICD-10-CM

## 2018-07-28 LAB — HEPATIC FUNCTION PANEL
ALT: 20 U/L (ref 0–35)
AST: 30 U/L (ref 0–37)
Albumin: 4.6 g/dL (ref 3.5–5.2)
Alkaline Phosphatase: 78 U/L (ref 39–117)
BILIRUBIN TOTAL: 1 mg/dL (ref 0.2–1.2)
Bilirubin, Direct: 0.1 mg/dL (ref 0.0–0.3)
Total Protein: 7.1 g/dL (ref 6.0–8.3)

## 2018-07-28 LAB — CBC WITH DIFFERENTIAL/PLATELET
Basophils Absolute: 0 10*3/uL (ref 0.0–0.1)
Basophils Relative: 0.5 % (ref 0.0–3.0)
EOS ABS: 0 10*3/uL (ref 0.0–0.7)
Eosinophils Relative: 0.7 % (ref 0.0–5.0)
HCT: 38.3 % (ref 36.0–46.0)
Hemoglobin: 12.9 g/dL (ref 12.0–15.0)
LYMPHS ABS: 1.1 10*3/uL (ref 0.7–4.0)
Lymphocytes Relative: 27 % (ref 12.0–46.0)
MCHC: 33.7 g/dL (ref 30.0–36.0)
MCV: 95.2 fl (ref 78.0–100.0)
MONO ABS: 0.5 10*3/uL (ref 0.1–1.0)
Monocytes Relative: 11.3 % (ref 3.0–12.0)
NEUTROS PCT: 60.5 % (ref 43.0–77.0)
Neutro Abs: 2.5 10*3/uL (ref 1.4–7.7)
PLATELETS: 302 10*3/uL (ref 150.0–400.0)
RBC: 4.02 Mil/uL (ref 3.87–5.11)
RDW: 13 % (ref 11.5–15.5)
WBC: 4.2 10*3/uL (ref 4.0–10.5)

## 2018-07-28 LAB — BASIC METABOLIC PANEL
BUN: 12 mg/dL (ref 6–23)
CALCIUM: 9.5 mg/dL (ref 8.4–10.5)
CHLORIDE: 99 meq/L (ref 96–112)
CO2: 28 mEq/L (ref 19–32)
CREATININE: 0.65 mg/dL (ref 0.40–1.20)
GFR: 94.51 mL/min (ref >60.00)
GLUCOSE: 96 mg/dL (ref 70–99)
POTASSIUM: 4.2 meq/L (ref 3.5–5.1)
Sodium: 135 mEq/L (ref 135–145)

## 2018-07-28 LAB — LIPID PANEL
Cholesterol: 260 mg/dL — ABNORMAL HIGH (ref 0–200)
HDL: 77.6 mg/dL (ref >39.00)
LDL CALC: 169 mg/dL — AB (ref 0–99)
NONHDL: 182.3
Total CHOL/HDL Ratio: 3
Triglycerides: 69 mg/dL (ref 0.0–149.0)
VLDL: 13.8 mg/dL (ref 0.0–40.0)

## 2018-07-28 LAB — TSH: TSH: 1.55 u[IU]/mL (ref 0.35–4.50)

## 2018-07-28 MED ORDER — GABAPENTIN 100 MG PO CAPS: 200.0000 mg | ORAL_CAPSULE | Freq: Every day | ORAL | 3 refills | Status: DC

## 2018-07-28 NOTE — Patient Instructions (Addendum)
consider  coronary artery calcium scoring for more risk assessment about  benefit  risk  of statin medication.   Will notify you  of labs when available.   Checking  iron levels   The 10-year ASCVD risk score Mikey Bussing DC Jr., et al., 2013) is: 9.8%   Values used to calculate the score:     Age: 72 years     Sex: Female     Is Non-Hispanic African American: No     Diabetic: No     Tobacco smoker: No     Systolic Blood Pressure: 676 mmHg     Is BP treated: No     HDL Cholesterol: 85 mg/dL     Total Cholesterol: 273 mg/dL    Preventive Care 65 Years and Older, Female Preventive care refers to lifestyle choices and visits with your health care provider that can promote health and wellness. What does preventive care include?  A yearly physical exam. This is also called an annual well check.  Dental exams once or twice a year.  Routine eye exams. Ask your health care provider how often you should have your eyes checked.  Personal lifestyle choices, including: ? Daily care of your teeth and gums. ? Regular physical activity. ? Eating a healthy diet. ? Avoiding tobacco and drug use. ? Limiting alcohol use. ? Practicing safe sex. ? Taking low-dose aspirin every day. ? Taking vitamin and mineral supplements as recommended by your health care provider. What happens during an annual well check? The services and screenings done by your health care provider during your annual well check will depend on your age, overall health, lifestyle risk factors, and family history of disease. Counseling Your health care provider may ask you questions about your:  Alcohol use.  Tobacco use.  Drug use.  Emotional well-being.  Home and relationship well-being.  Sexual activity.  Eating habits.  History of falls.  Memory and ability to understand (cognition).  Work and work Statistician.  Reproductive health.  Screening You may have the following tests or measurements:  Height,  weight, and BMI.  Blood pressure.  Lipid and cholesterol levels. These may be checked every 5 years, or more frequently if you are over 7 years old.  Skin check.  Lung cancer screening. You may have this screening every year starting at age 77 if you have a 30-pack-year history of smoking and currently smoke or have quit within the past 15 years.  Fecal occult blood test (FOBT) of the stool. You may have this test every year starting at age 66.  Flexible sigmoidoscopy or colonoscopy. You may have a sigmoidoscopy every 5 years or a colonoscopy every 10 years starting at age 55.  Hepatitis C blood test.  Hepatitis B blood test.  Sexually transmitted disease (STD) testing.  Diabetes screening. This is done by checking your blood sugar (glucose) after you have not eaten for a while (fasting). You may have this done every 1-3 years.  Bone density scan. This is done to screen for osteoporosis. You may have this done starting at age 52.  Mammogram. This may be done every 1-2 years. Talk to your health care provider about how often you should have regular mammograms.  Talk with your health care provider about your test results, treatment options, and if necessary, the need for more tests. Vaccines Your health care provider may recommend certain vaccines, such as:  Influenza vaccine. This is recommended every year.  Tetanus, diphtheria, and acellular pertussis (Tdap, Td)  vaccine. You may need a Td booster every 10 years.  Varicella vaccine. You may need this if you have not been vaccinated.  Zoster vaccine. You may need this after age 83.  Measles, mumps, and rubella (MMR) vaccine. You may need at least one dose of MMR if you were born in 1957 or later. You may also need a second dose.  Pneumococcal 13-valent conjugate (PCV13) vaccine. One dose is recommended after age 96.  Pneumococcal polysaccharide (PPSV23) vaccine. One dose is recommended after age 60.  Meningococcal vaccine.  You may need this if you have certain conditions.  Hepatitis A vaccine. You may need this if you have certain conditions or if you travel or work in places where you may be exposed to hepatitis A.  Hepatitis B vaccine. You may need this if you have certain conditions or if you travel or work in places where you may be exposed to hepatitis B.  Haemophilus influenzae type b (Hib) vaccine. You may need this if you have certain conditions.  Talk to your health care provider about which screenings and vaccines you need and how often you need them. This information is not intended to replace advice given to you by your health care provider. Make sure you discuss any questions you have with your health care provider. Document Released: 09/21/2015 Document Revised: 05/14/2016 Document Reviewed: 06/26/2015 Elsevier Interactive Patient Education  2018 [employer].    Coronary Calcium Scan A coronary calcium scan is an imaging test used to look for deposits of calcium and other fatty materials (plaques) in the inner lining of the blood vessels of the heart (coronary arteries). These deposits of calcium and plaques can partly clog and narrow the coronary arteries without producing any symptoms or warning signs. This puts a person at risk for a heart attack. This test can detect these deposits before symptoms develop. Tell a health care provider about:  Any allergies you have.  All medicines you are taking, including vitamins, herbs, eye drops, creams, and over-the-counter medicines.  Any problems you or family members have had with anesthetic medicines.  Any blood disorders you have.  Any surgeries you have had.  Any medical conditions you have.  Whether you are pregnant or may be pregnant. What are the risks? Generally, this is a safe procedure. However, problems may occur, including:  Harm to a pregnant woman and her unborn baby. This test involves the use of radiation. Radiation exposure  can be dangerous to a pregnant woman and her unborn baby. If you are pregnant, you generally should not have this procedure done.  Slight increase in the risk of cancer. This is because of the radiation involved in the test.  What happens before the procedure? No preparation is needed for this procedure. What happens during the procedure?  You will undress and remove any jewelry around your neck or chest.  You will put on a hospital gown.  Sticky electrodes will be placed on your chest. The electrodes will be connected to an electrocardiogram (ECG) machine to record a tracing of the electrical activity of your heart.  A CT scanner will take pictures of your heart. During this time, you will be asked to lie still and hold your breath for 2-3 seconds while a picture of your heart is being taken. The procedure may vary among health care providers and hospitals. What happens after the procedure?  You can get dressed.  You can return to your normal activities.  It is up to  you to get the results of your test. Ask your health care provider, or the department that is doing the test, when your results will be ready. Summary  A coronary calcium scan is an imaging test used to look for deposits of calcium and other fatty materials (plaques) in the inner lining of the blood vessels of the heart (coronary arteries).  Generally, this is a safe procedure. Tell your health care provider if you are pregnant or may be pregnant.  No preparation is needed for this procedure.  A CT scanner will take pictures of your heart.  You can return to your normal activities after the scan is done. This information is not intended to replace advice given to you by your health care provider. Make sure you discuss any questions you have with your health care provider. Document Released: 02/21/2008 Document Revised: 07/14/2016 Document Reviewed: 07/14/2016 Elsevier Interactive Patient Education  2017 [employer].

## 2018-07-29 LAB — IRON,TIBC AND FERRITIN PANEL
%SAT: 35 % (ref 16–45)
Ferritin: 80 ng/mL (ref 16–288)
IRON: 125 ug/dL (ref 45–160)
TIBC: 356 mcg/dL (calc) (ref 250–450)

## 2019-06-19 ENCOUNTER — Emergency Department: Payer: Self-pay

## 2019-06-19 ENCOUNTER — Emergency Department (HOSPITAL_COMMUNITY)
Admission: EM | Admit: 2019-06-19 | Discharge: 2019-06-19 | Disposition: A | Discharge status: Home / Self Care | Payer: Medicare Other | Attending: Emergency Medicine | Admitting: Emergency Medicine

## 2019-06-19 ENCOUNTER — Emergency Department: Payer: Medicare Other

## 2019-06-19 ENCOUNTER — Other Ambulatory Visit: Payer: Self-pay

## 2019-06-19 ENCOUNTER — Encounter (HOSPITAL_COMMUNITY): Payer: Self-pay

## 2019-06-19 ENCOUNTER — Emergency Department (HOSPITAL_COMMUNITY): Payer: Medicare Other

## 2019-06-19 DIAGNOSIS — S62639B Displaced fracture of distal phalanx of unspecified finger, initial encounter for open fracture: Secondary | ICD-10-CM

## 2019-06-19 DIAGNOSIS — Y9389 Activity, other specified: Secondary | ICD-10-CM | POA: Diagnosis not present

## 2019-06-19 DIAGNOSIS — Y998 Other external cause status: Secondary | ICD-10-CM | POA: Insufficient documentation

## 2019-06-19 DIAGNOSIS — T148XXA Other injury of unspecified body region, initial encounter: Secondary | ICD-10-CM

## 2019-06-19 DIAGNOSIS — S61314A Laceration without foreign body of right ring finger with damage to nail, initial encounter: Secondary | ICD-10-CM | POA: Insufficient documentation

## 2019-06-19 DIAGNOSIS — Z7982 Long term (current) use of aspirin: Secondary | ICD-10-CM | POA: Diagnosis not present

## 2019-06-19 DIAGNOSIS — S6991XA Unspecified injury of right wrist, hand and finger(s), initial encounter: Secondary | ICD-10-CM | POA: Diagnosis present

## 2019-06-19 DIAGNOSIS — Y92015 Private garage of single-family (private) house as the place of occurrence of the external cause: Secondary | ICD-10-CM | POA: Insufficient documentation

## 2019-06-19 DIAGNOSIS — Z87891 Personal history of nicotine dependence: Secondary | ICD-10-CM | POA: Diagnosis not present

## 2019-06-19 DIAGNOSIS — W231XXA Caught, crushed, jammed, or pinched between stationary objects, initial encounter: Secondary | ICD-10-CM | POA: Diagnosis not present

## 2019-06-19 DIAGNOSIS — S62634B Displaced fracture of distal phalanx of right ring finger, initial encounter for open fracture: Secondary | ICD-10-CM | POA: Insufficient documentation

## 2019-06-19 DIAGNOSIS — Z79899 Other long term (current) drug therapy: Secondary | ICD-10-CM | POA: Insufficient documentation

## 2019-06-19 MED ORDER — CEFAZOLIN SODIUM-DEXTROSE 1-4 GM/50ML-% IV SOLN
1.0000 g | Freq: Once | INTRAVENOUS | Status: AC
  Administered 2019-06-19: 1 g via INTRAVENOUS
  Filled 2019-06-19: qty 50

## 2019-06-19 MED ORDER — LIDOCAINE HCL (PF) 1 % IJ SOLN
10.0000 mL | Freq: Once | INTRAMUSCULAR | Status: AC
  Administered 2019-06-19: 15:00:00 10 mL
  Filled 2019-06-19: qty 10

## 2019-06-19 MED ORDER — CEPHALEXIN 500 MG PO CAPS: 500.0000 mg | ORAL_CAPSULE | Freq: Three times a day (TID) | ORAL | 0 refills | Status: AC

## 2019-06-19 MED ORDER — OXYCODONE-ACETAMINOPHEN 5-325 MG PO TABS: 1.0000 | ORAL_TABLET | Freq: Four times a day (QID) | ORAL | 0 refills | Status: DC | PRN

## 2019-06-19 MED ORDER — LIDOCAINE HCL (PF) 1 % IJ SOLN
INTRAMUSCULAR | Status: AC
  Administered 2019-06-19: 15:00:00
  Filled 2019-06-19: qty 30

## 2019-06-19 NOTE — Discharge Instructions (Signed)
Please keep splint on until you follow up with Dr. Percell Miller on Friday 10/16 at 8:30 AM Keep wound clean and dry  Take antibiotics as prescribed You may take Ibuprofen 600 mg every 6-8 hours as needed for pain. If you have break through pain you may take the percocet as prescribed. Do not exceed recommend daily dose.

## 2019-06-19 NOTE — ED Triage Notes (Signed)
Patient caught right hand ring finger -tip of finger in garage door this am and sent from [facility]. Denies pain. No pain

## 2019-06-19 NOTE — ED Notes (Signed)
Patient transported to X-ray 

## 2019-06-19 NOTE — Progress Notes (Signed)
Orthopedic Tech Progress Note Patient Details:  Nichole Berg November 19, 1945 [id number]  Ortho Devices Type of Ortho Device: Finger splint Ortho Device/Splint Location: URE Ortho Device/Splint Interventions: Adjustment, Application, Ordered   Post Interventions Patient Tolerated: Well Instructions Provided: Care of device, Adjustment of device   [name] 06/19/2019, 3:50 PM

## 2019-06-19 NOTE — ED Provider Notes (Signed)
[facility] EMERGENCY DEPARTMENT Provider Note   CSN: [id number] Arrival date & time: 06/19/19  1100     History   Chief Complaint No chief complaint on file.   HPI Nichole Berg is a 73 y.o. female who presents to the ED today with right ring finger injury that occurred earlier this morning.  Patient reports that power in her house when out.  She was closing the garage door manually when it accidentally landed onto her right ring finger causing immediate pain to the area.  Patient also has laceration to the area.  [facility] and had an x-ray done which showed a distal tuft fracture.  Considering patient has a laceration over it she was considered an open fracture and was sent to the ED for further evaluation.  She occasionally takes a 325 mg aspirin but is not anticoagulated.  She reports her tetanus is up-to-date.  No other complaints at this time.        Past Medical History:  Diagnosis Date  . Cataract 10/18/2011   Followed by opthalmology.   . COLONIC POLYPS, HX OF 10/12/2009  . HYPERLIPIDEMIA 05/31/2007  . NEPHROLITHIASIS, HX OF 05/31/2007    Patient Active Problem List   Diagnosis Date Noted  . Leg cramping 07/20/2018  . Frozen shoulder 06/01/2018  . Low back pain 02/11/2018  . Other bursal cyst of wrist, left 01/21/2017  . 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 Degenerative arthritis of cervical spine 12/04/2015  . Nonallopathic lesion of cervical region 12/04/2015  . Nonallopathic lesion of thoracic region 12/04/2015  . Nonallopathic lesion-rib cage 12/04/2015  . Hyperglycemia 06/16/2014  . Medicare annual wellness visit, subsequent 06/16/2014  . Visit for preventive health examination 10/13/2011  . COUGH 10/12/2009  . COLONIC POLYPS, HX OF 10/12/2009  . SYMPTOM, INSOMNIA NOS 06/30/2007  . Hyperlipidemia 05/31/2007  . Osteopenia 05/31/2007  . NEPHROLITHIASIS, HX OF 05/31/2007    Past Surgical History:  Procedure Laterality  Date  . CATARACT EXTRACTION, BILATERAL       OB History   No obstetric history on file.      Home Medications    Prior to Admission medications   Medication Sig Start Date End Date Taking? Authorizing Provider  Artificial Tear Ointment (DRY EYES OP) Apply 1 drop to eye daily as needed (dry eyes).   Yes [provider]  aspirin 325 MG EC tablet Take 325 mg by mouth daily.   Yes [provider]  Cholecalciferol (VITAMIN D) 2000 UNITS tablet Take 2,000 Units by mouth daily.     Yes [provider]  gabapentin (NEURONTIN) 100 MG capsule Take 2 capsules (200 mg total) by mouth at bedtime. Patient taking differently: Take 100 mg by mouth at bedtime as needed (pain).  07/28/18  Yes Judi Saa, DO  Misc Natural Products (TART CHERRY ADVANCED PO) Take 800 mg by mouth daily.   Yes [provider]  Multiple Vitamins-Minerals (ICAPS) CAPS Take by mouth.   Yes [provider]  naproxen sodium (ALEVE) 220 MG tablet Take 220 mg by mouth daily as needed (finger pain).   Yes [provider]  cephALEXin (KEFLEX) 500 MG capsule Take 1 capsule (500 mg total) by mouth 3 (three) times daily for 7 days. 06/19/19 06/26/19  Tanda Rockers, PA-C  oxyCODONE-acetaminophen (PERCOCET/ROXICET) 5-325 MG tablet Take 1 tablet by mouth every 6 (six) hours as needed for severe pain. 06/19/19   Tanda Rockers, PA-C  Family History Family History  Problem Relation Age of Onset  . Atrial fibrillation Mother        Has pacer doing very well age 74 passed suddnely age 49 poss CV    Social History Social History   Tobacco Use  . Smoking status: Former Smoker    Years: 2.00    Types: Cigarettes    Quit date: 09/08/1964    Years since quitting: 54.8  . Smokeless tobacco: Never Used  Substance Use Topics  . Alcohol use: Yes  . Drug use: No     Allergies   Sulfonamide derivatives   Review of Systems Review of Systems  Constitutional: Negative for  chills and fever.  Musculoskeletal: Positive for arthralgias.  Skin: Positive for wound.  All other systems reviewed and are negative.    Physical Exam Updated Vital Signs BP (!) 162/83 (BP Location: Right Arm)   Pulse 63   Temp 97.6 F (36.4 C) (Oral)   Resp 14   SpO2 98%   Physical Exam Vitals signs and nursing note reviewed.  Constitutional:      Appearance: She is not ill-appearing.  HENT:     Head: Normocephalic and atraumatic.  Eyes:     Conjunctiva/sclera: Conjunctivae normal.  Neck:     Musculoskeletal: Neck supple.  Cardiovascular:     Rate and Rhythm: Normal rate and regular rhythm.     Pulses: Normal pulses.  Pulmonary:     Effort: Pulmonary effort is normal.     Breath sounds: Normal breath sounds. No wheezing, rhonchi or rales.  Abdominal:     Palpations: Abdomen is soft.     Tenderness: There is no abdominal tenderness.  Musculoskeletal:     Comments: See photos below. 2 large open lacerations noted to the right ring finger along the lateral aspect and fat pad. No nail involvement. Subungual hematoma present. Tenderness present the distal phalanx. ROM limited to the DIP joint due to pain. ROM intact to MCP and PIP joint of same finger. No tenderness to all other fingers or metacarpals. ROM intact throughout [facility] joint. 2+ radial pulse.   Skin:    General: Skin is warm and dry.  Neurological:     Mental Status: She is alert.            ED Treatments / Results  Labs (all labs ordered are listed, but only abnormal results are displayed) Labs Reviewed - No data to display  EKG None  Radiology Dg Finger Ring Right  Result Date: 06/19/2019 CLINICAL DATA:  Tuft fracture. EXAM: RIGHT RING FINGER 2+V COMPARISON:  None. FINDINGS: Displaced, mildly comminuted fracture of the tuft of the fourth digit. Soft tissue swelling. Equivocal 2 mm radiopaque foreign object about the dorsal aspect of the distal phalanx, likely about the nailbed. IMPRESSION:  Displaced, comminuted tuft fracture as detailed above. Electronically Signed   By: Jeronimo Greaves M.D.   On: 06/19/2019 13:30    Procedures .Marland KitchenLaceration Repair  Date/Time: 06/19/2019 3:14 PM Performed by: Tanda Rockers, PA-C Authorized by: Tanda Rockers, PA-C   Consent:    Consent obtained:  Verbal   Consent given by:  Patient   Risks discussed:  Infection, pain and poor cosmetic result Anesthesia (see MAR for exact dosages):    Anesthesia method:  Local infiltration   Local anesthetic:  Lidocaine 1% w/o epi Laceration details:    Location:  Finger   Finger location:  R ring finger   Length (cm):  3   Depth (mm):  3 Repair type:    Repair type:  Intermediate Pre-procedure details:    Preparation:  Patient was prepped and draped in usual sterile fashion Exploration:    Hemostasis achieved with:  Direct pressure   Wound exploration: wound explored through full range of motion and entire depth of wound probed and visualized     Contaminated: no   Treatment:    Area cleansed with:  Betadine   Amount of cleaning:  Extensive   Irrigation solution:  Sterile saline   Irrigation method:  Syringe   Visualized foreign bodies/material removed: no   Skin repair:    Repair method:  Sutures   Suture size:  5-0   Suture material:  Nylon   Suture technique:  Simple interrupted   Number of sutures:  7 Approximation:    Approximation:  Loose Post-procedure details:    Dressing:  Non-adherent dressing and splint for protection   Patient tolerance of procedure:  Tolerated well, no immediate complications   (including critical care time)  Medications Ordered in ED Medications  lidocaine (PF) (XYLOCAINE) 1 % injection 10 mL (has no administration in time range)  ceFAZolin (ANCEF) IVPB 1 g/50 mL premix (0 g Intravenous Stopped 06/19/19 1455)  lidocaine (PF) (XYLOCAINE) 1 % injection (  Given by Other 06/19/19 1511)     Initial Impression / Assessment and Plan / ED Course  I have  reviewed the triage vital signs and the nursing notes.  Pertinent labs & imaging results that were available during my care of the patient were reviewed by me and considered in my medical decision making (see chart for details).    73 year old female who presents to the ED with finger injury after getting it caught on garage door while closing. Seen at [facility] and sent here due to open distal tuft fracture. Pt has CD xray with her - radiology unable to transfer it in our system. Will obtain another xray at this time. Pt's hand soaking in betadine and water in the meantime - will consult hand surgery for further reccs. Pt is up to date on tetanus. Will cover with ancef in the meantime.   Dr. Percell Miller recommends closure in the ED and follow up outpatient either Wednesday or Friday morning 8:30 AM. Pt is on her way to vacation at [facility]. She will leave a day earlier and follow up with him on Friday.   Laceration closed in the ED loosely. Dressing applied and splint applied over dressing. Pt advised to wear splint until she sees Dr. Percell Miller as scheduled. Have prescribed Keflex 500 mg TID x 7 days and a short course of pain medication as needed. 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 PDMP reviewed; no issues. Pt stable for discharge at this time.   This note was prepared using Dragon voice recognition software and may include unintentional dictation errors due to the inherent limitations of voice recognition software.       Final Clinical Impressions(s) / ED Diagnoses   Final diagnoses:  Open fracture of tuft of distal phalanx of finger  Laceration of right ring finger without foreign body with damage to nail, initial encounter    ED Discharge Orders         Ordered    cephALEXin (KEFLEX) 500 MG capsule  3 times daily     06/19/19 1510    oxyCODONE-acetaminophen (PERCOCET/ROXICET) 5-325 MG tablet  Every 6 hours  PRN     06/19/19 1510           Tanda RockersVenter, Diesha Rostad, PA-C 06/19/19 1515    Arby BarrettePfeiffer, Marcy, MD 06/20/19 [phone]

## 2019-08-09 ENCOUNTER — Other Ambulatory Visit: Payer: Self-pay

## 2019-08-10 ENCOUNTER — Encounter: Payer: Self-pay | Admitting: Internal Medicine

## 2019-08-10 ENCOUNTER — Ambulatory Visit (INDEPENDENT_AMBULATORY_CARE_PROVIDER_SITE_OTHER): Payer: Medicare Other | Admitting: Internal Medicine

## 2019-08-10 VITALS — BP 110/64 | HR 64 | Temp 97.6°F | Ht 64.0 in | Wt 142.6 lb

## 2019-08-10 DIAGNOSIS — M8580 Other specified disorders of bone density and structure, unspecified site: Secondary | ICD-10-CM

## 2019-08-10 DIAGNOSIS — Z0000 Encounter for general adult medical examination without abnormal findings: Secondary | ICD-10-CM

## 2019-08-10 DIAGNOSIS — E785 Hyperlipidemia, unspecified: Secondary | ICD-10-CM

## 2019-08-10 NOTE — Progress Notes (Addendum)
This visit occurred during the SARS-CoV-2 public health emergency.  Safety protocols were in place, including screening questions prior to the visit, additional usage of staff PPE, and extensive cleaning of exam room while observing appropriate contact time as indicated for disinfecting solutions.    Chief Complaint  Patient presents with  . Annual Exam    HPI: Nichole Berg 73 y.o. comes in today for Preventive Medicare exam/ wellness visit .Since last visit. Doing well  Had finger laceration  bpt was up but then ok  Tough time with isolation but very healthy  Health Maintenance  Topic Date Due  . INFLUENZA VACCINE  04/09/2019  . TETANUS/TDAP  10/13/2019  . COLONOSCOPY  12/21/2021  . DEXA SCAN  Completed  . Hepatitis C Screening  Completed  . PNA vac Low Risk Adult  Completed   Health Maintenance Review LIFESTYLE:  Exercise:   y Tobacco/ETS:n Alcohol:  Sugar beverages:n Sleep:ok  Drug use: no HH: 2       Hearing:  Ok   Vision:  No limitations at present . Last eye check UTD  Safety:  Has smoke detector and wears seat belts.  No firearms. No excess sun exposure. Sees dentist regularly.  Falls:  No    Memory: Felt to be good  , no concern from her or her family.  Depression: No anhedonia unusual crying or depressive symptoms  Nutrition: Eats well balanced diet; adequate calcium and vitamin D. No swallowing chewing problems.  Injury: no major injuries in the last six months.  Other healthcare providers:  Reviewed today .  Preventive parameters: up-to-date  Reviewed   ADLS:   There are no problems or need for assistance  driving, feeding, obtaining food, dressing, toileting and bathing, managing money using phone. She is independent.    ROS:  GEN/ HEENT: No fever, significant weight changes sweats headaches vision problems hearing changes, CV/ PULM; No chest pain shortness of breath cough, syncope,edema  change in exercise tolerance. GI /GU: No adominal pain,  vomiting, change in bowel habits. No blood in the stool. No significant GU symptoms. SKIN/HEME: ,no acute skin rashes suspicious lesions or bleeding. No lymphadenopathy, nodules, masses.  NEURO/ PSYCH:  No neurologic signs such as weakness numbness. No depression anxiety. IMM/ Allergy: No unusual infections.  Allergy .   REST of 12 system review negative except as per HPI   Past Medical History:  Diagnosis Date  . Cataract 10/18/2011   Followed by opthalmology.   . COLONIC POLYPS, HX OF 10/12/2009  . HYPERLIPIDEMIA 05/31/2007  . NEPHROLITHIASIS, HX OF 05/31/2007    Family History  Problem Relation Age of Onset  . Atrial fibrillation Mother        Has pacer doing very well age 49 passed suddnely age 13 poss CV    Social History   Socioeconomic History  . Marital status: Single    Spouse name: Not on file  . Number of children: Not on file  . Years of education: Not on file  . Highest education level: Not on file  Occupational History  . Not on file  Social Needs  . Financial resource strain: Not on file  . Food insecurity    Worry: Not on file    Inability: Not on file  . Transportation needs    Medical: Not on file    Non-medical: Not on file  Tobacco Use  . Smoking status: Former Smoker    Years: 2.00    Types: Cigarettes    Quit  date: 09/08/1964    Years since quitting: 54.9  . Smokeless tobacco: Never Used  Substance and Sexual Activity  . Alcohol use: Yes  . Drug use: No  . Sexual activity: Not on file  Lifestyle  . Physical activity    Days per week: Not on file    Minutes per session: Not on file  . Stress: Not on file  Relationships  . Social Musician on phone: Not on file    Gets together: Not on file    Attends religious service: Not on file    Active member of club or organization: Not on file    Attends meetings of clubs or organizations: Not on file    Relationship status: Not on file  Other Topics Concern  . 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 Artificial Tear Ointment (DRY EYES OP) Apply 1 drop to eye daily as needed (dry eyes).  . Cholecalciferol (VITAMIN D) 2000 UNITS tablet Take 2,000 Units by mouth daily.    . Misc Natural Products (TART CHERRY ADVANCED PO) Take 800 mg by mouth daily.  . Multiple Vitamins-Minerals (ICAPS) CAPS Take by mouth.  . naproxen sodium (ALEVE) 220 MG tablet Take 220 mg by mouth daily as needed (finger pain).  . [DISCONTINUED] gabapentin (NEURONTIN) 100 MG capsule Take 2 capsules (200 mg total) by mouth at bedtime. (Patient taking differently: Take 100 mg by mouth at bedtime as needed (pain). )  . [DISCONTINUED] aspirin 325 MG EC tablet Take 325 mg by mouth daily.  . [DISCONTINUED] oxyCODONE-acetaminophen (PERCOCET/ROXICET) 5-325 MG tablet Take 1 tablet by mouth every 6 (six) hours as needed for severe pain. (Patient not taking: Reported on 08/10/2019)   No facility-administered encounter medications on file as of 08/10/2019.     EXAM:  BP 110/64 (BP Location: Right Arm, Patient Position: Sitting, Cuff Size: Normal)   Pulse 64   Temp 97.6 F (36.4 C) (Temporal)   Ht  (1.626 m)   Wt 142 lb 9.6 oz (64.7 kg)   SpO2 97%   BMI 24.48 kg/m   Body mass index is 24.48 kg/m.  Physical Exam: Vital signs reviewed ONG:EXBM is a well-developed well-nourished alert cooperative   who appears stated age in no acute distress.  HEENT: normocephalic atraumatic , Eyes: PERRL EOM's full, conjunctiva clear, Nares: paten,t no deformity discharge or tenderness., Ears: no deformity EAC's clear TMs with normal landmarks. Mouth:OP,masked. NECK: supple without masses, thyromegaly or bruits. CHEST/PULM:  Clear to auscultation and percussion breath sounds equal no wheeze , rales or rhonchi. No chest wall  deformities or tenderness. CV: PMI is nondisplaced, S1 S2 no gallops, murmurs, rubs. Peripheral pulses are full without delay.No JVD .  ABDOMEN: Bowel sounds normal nontender  No guard or rebound, no hepato splenomegal no CVA tenderness.   Extremtities:  No clubbing cyanosis or edema, no acute joint swelling or redness no focal atrophy NEURO:  Oriented x3, cranial nerves 3-12 appear to be intact, no obvious focal weakness,gait within normal limits no abnormal reflexes or asymmetrical SKIN: No acute rashes normal turgor, color, no bruising or petechiae. Sun changes    PSYCH: Oriented, good eye contact, no obvious depression anxiety, cognition and judgment appear  normal. LN: no cervical axillary inguinal adenopathy No noted deficits in memory, attention, and speech.   Lab Results  Component Value Date   WBC 4.2 07/28/2018   HGB 12.9 07/28/2018   HCT 38.3 07/28/2018   PLT 302.0 07/28/2018   GLUCOSE 96 07/28/2018   CHOL 260 (H) 07/28/2018   TRIG 69.0 07/28/2018   HDL 77.60 07/28/2018   LDLDIRECT 160.3 10/13/2011   LDLCALC 169 (H) 07/28/2018   ALT 20 07/28/2018   AST 30 07/28/2018   NA 135 07/28/2018   K 4.2 07/28/2018   CL 99 07/28/2018   CREATININE 0.65 07/28/2018   BUN 12 07/28/2018   CO2 28 07/28/2018   TSH 1.55 07/28/2018   HGBA1C 5.5 05/25/2017    ASSESSMENT AND PLAN:  Discussed the following assessment and plan:  Visit for preventive health examination  Hyperlipidemia, unspecified hyperlipidemia type - Plan: Basic metabolic panel, Lipid panel  Osteopenia, unspecified location - Plan: Basic metabolic panel, Lipid panel Ok to do cologuard when due as ave risk  If needed    Order singed for dexa scan at next [employer]  Return in about 1 year (around 08/09/2020) for cpx .  Patient Care Team: Roylee Chaffin, Standley Brooking, MD as PCP - General Richmond Campbell, MD as Consulting Physician (Gastroenterology) Shon Hough, MD as Consulting Physician (Ophthalmology)  Patient  Instructions  Continue lifestyle intervention healthy eating and exercise .   Can get dexa scan  And mammogram when due.  Continue lifestyle intervention healthy eating and exercise .   bp goal below 140/90   Fasting labs   Health Maintenance, Female Adopting a healthy lifestyle and getting preventive care are important in promoting health and wellness. Ask your health care provider about:  The right schedule for you to have regular tests and exams.  Things you can do on your own to prevent diseases and keep yourself healthy. What should I know about diet, weight, and exercise? Eat a healthy diet   Eat a diet that includes plenty of vegetables, fruits, low-fat dairy products, and lean protein.  Do not eat a lot of foods that are high in solid fats, added sugars, or sodium. Maintain a healthy weight Body mass index (BMI) is used to identify weight problems. It estimates body fat based on height and weight. Your health care provider can help determine your BMI and help you achieve or maintain a healthy weight. Get regular exercise Get regular exercise. This is one of the most important things you can do for your health. Most adults should:  Exercise for at least 150 minutes each week. The exercise should increase your heart rate and make you sweat (moderate-intensity exercise).  Do strengthening exercises at least twice a week. This is in addition to the moderate-intensity exercise.  Spend less time sitting. Even light physical activity can be beneficial. Watch cholesterol and blood lipids Have your blood tested for lipids and cholesterol at 75 years of age, then have this test every 5 years. Have your cholesterol levels checked more often if:  Your lipid or cholesterol levels are high.  You are older than 75 years of age.  You are at high risk for heart disease. What should I know about cancer screening? Depending on your health history and family history, you may need to  have cancer screening at various ages. This may include screening for:  Breast cancer.  Cervical cancer.  Colorectal cancer.  Skin cancer.  Lung cancer. What should I know about heart  disease, diabetes, and high blood pressure? Blood pressure and heart disease  High blood pressure causes heart disease and increases the risk of stroke. This is more likely to develop in people who have high blood pressure readings, are of African descent, or are overweight.  Have your blood pressure checked: ? Every 3-5 years if you are 1318-75 years of age. ? Every year if you are 75 years old or older. Diabetes Have regular diabetes screenings. This checks your fasting blood sugar level. Have the screening done:  Once every three years after age 75 if you are at a normal weight and have a low risk for diabetes.  More often and at a younger age if you are overweight or have a high risk for diabetes. What should I know about preventing infection? Hepatitis B If you have a higher risk for hepatitis B, you should be screened for this virus. Talk with your health care provider to find out if you are at risk for hepatitis B infection. Hepatitis C Testing is recommended for:  Everyone born from [birth year] through 1965.  Anyone with known risk factors for hepatitis C. Sexually transmitted infections (STIs)  Get screened for STIs, including gonorrhea and chlamydia, if: ? You are sexually active and are younger than 75 years of age. ? You are older than 75 years of age and your health care provider tells you that you are at risk for this type of infection. ? Your sexual activity has changed since you were last screened, and you are at increased risk for chlamydia or gonorrhea. Ask your health care provider if you are at risk.  Ask your health care provider about whether you are at high risk for HIV. Your health care provider may recommend a prescription medicine to help prevent HIV infection. If you choose to  take medicine to prevent HIV, you should first get tested for HIV. You should then be tested every 3 months for as long as you are taking the medicine. Pregnancy  If you are about to stop having your period (premenopausal) and you may become pregnant, seek counseling before you get pregnant.  Take 400 to 800 micrograms (mcg) of folic acid every day if you become pregnant.  Ask for birth control (contraception) if you want to prevent pregnancy. Osteoporosis and menopause Osteoporosis is a disease in which the bones lose minerals and strength with aging. This can result in bone fractures. If you are 75 years old or older, or if you are at risk for osteoporosis and fractures, ask your health care provider if you should:  Be screened for bone loss.  Take a calcium or vitamin D supplement to lower your risk of fractures.  Be given hormone replacement therapy (HRT) to treat symptoms of menopause. Follow these instructions at home: Lifestyle  Do not use any products that contain nicotine or tobacco, such as cigarettes, e-cigarettes, and chewing tobacco. If you need help quitting, ask your health care provider.  Do not use street drugs.  Do not share needles.  Ask your health care provider for help if you need support or information about quitting drugs. Alcohol use  Do not drink alcohol if: ? Your health care provider tells you not to drink. ? You are pregnant, may be pregnant, or are planning to become pregnant.  If you drink alcohol: ? Limit how much you use to 0-1 drink a day. ? Limit intake if you are breastfeeding.  Be aware of how much alcohol is in your  drink. In the [place] one drink equals one 12 oz bottle of beer (355 mL), one 5 oz glass of wine (148 mL), or one 1 oz glass of hard liquor (44 mL). General instructions  Schedule regular health, dental, and eye exams.  Stay current with your vaccines.  Tell your health care provider if: ? You often feel depressed. ? You  have ever been abused or do not feel safe at home. Summary  Adopting a healthy lifestyle and getting preventive care are important in promoting health and wellness.  Follow your health care provider's instructions about healthy diet, exercising, and getting tested or screened for diseases.  Follow your health care provider's instructions on monitoring your cholesterol and blood pressure. This information is not intended to replace advice given to you by your health care provider. Make sure you discuss any questions you have with your health care provider. Document Released: 03/10/2011 Document Revised: 08/18/2018 Document Reviewed: 08/18/2018 Elsevier Patient Education  2020 [employer].    [place] K. Makenzie Weisner M.D.

## 2019-08-10 NOTE — Addendum Note (Signed)
Addended byBurnis Medin on: 08/10/2019 05:43 PM   Modules accepted: Orders

## 2019-08-10 NOTE — Patient Instructions (Signed)
Continue lifestyle intervention healthy eating and exercise .   Can get dexa scan  And mammogram when due.  Continue lifestyle intervention healthy eating and exercise .   bp goal below 140/90   Fasting labs   Health Maintenance, Female Adopting a healthy lifestyle and getting preventive care are important in promoting health and wellness. Ask your health care provider about:  The right schedule for you to have regular tests and exams.  Things you can do on your own to prevent diseases and keep yourself healthy. What should I know about diet, weight, and exercise? Eat a healthy diet   Eat a diet that includes plenty of vegetables, fruits, low-fat dairy products, and lean protein.  Do not eat a lot of foods that are high in solid fats, added sugars, or sodium. Maintain a healthy weight Body mass index (BMI) is used to identify weight problems. It estimates body fat based on height and weight. Your health care provider can help determine your BMI and help you achieve or maintain a healthy weight. Get regular exercise Get regular exercise. This is one of the most important things you can do for your health. Most adults should:  Exercise for at least 150 minutes each week. The exercise should increase your heart rate and make you sweat (moderate-intensity exercise).  Do strengthening exercises at least twice a week. This is in addition to the moderate-intensity exercise.  Spend less time sitting. Even light physical activity can be beneficial. Watch cholesterol and blood lipids Have your blood tested for lipids and cholesterol at 75 years of age, then have this test every 5 years. Have your cholesterol levels checked more often if:  Your lipid or cholesterol levels are high.  You are older than 75 years of age.  You are at high risk for heart disease. What should I know about cancer screening? Depending on your health history and family history, you may need to have cancer  screening at various ages. This may include screening for:  Breast cancer.  Cervical cancer.  Colorectal cancer.  Skin cancer.  Lung cancer. What should I know about heart disease, diabetes, and high blood pressure? Blood pressure and heart disease  High blood pressure causes heart disease and increases the risk of stroke. This is more likely to develop in people who have high blood pressure readings, are of African descent, or are overweight.  Have your blood pressure checked: ? Every 3-5 years if you are 75-75 years of age. ? Every year if you are 75 years old or older. Diabetes Have regular diabetes screenings. This checks your fasting blood sugar level. Have the screening done:  Once every three years after age 75 if you are at a normal weight and have a low risk for diabetes.  More often and at a younger age if you are overweight or have a high risk for diabetes. What should I know about preventing infection? Hepatitis B If you have a higher risk for hepatitis B, you should be screened for this virus. Talk with your health care provider to find out if you are at risk for hepatitis B infection. Hepatitis C Testing is recommended for:  Everyone born from [birth year] through 1965.  Anyone with known risk factors for hepatitis C. Sexually transmitted infections (STIs)  Get screened for STIs, including gonorrhea and chlamydia, if: ? You are sexually active and are younger than 75 years of age. ? You are older than 75 years of age and your health care  provider tells you that you are at risk for this type of infection. ? Your sexual activity has changed since you were last screened, and you are at increased risk for chlamydia or gonorrhea. Ask your health care provider if you are at risk.  Ask your health care provider about whether you are at high risk for HIV. Your health care provider may recommend a prescription medicine to help prevent HIV infection. If you choose to take  medicine to prevent HIV, you should first get tested for HIV. You should then be tested every 3 months for as long as you are taking the medicine. Pregnancy  If you are about to stop having your period (premenopausal) and you may become pregnant, seek counseling before you get pregnant.  Take 400 to 800 micrograms (mcg) of folic acid every day if you become pregnant.  Ask for birth control (contraception) if you want to prevent pregnancy. Osteoporosis and menopause Osteoporosis is a disease in which the bones lose minerals and strength with aging. This can result in bone fractures. If you are 75 years old or older, or if you are at risk for osteoporosis and fractures, ask your health care provider if you should:  Be screened for bone loss.  Take a calcium or vitamin D supplement to lower your risk of fractures.  Be given hormone replacement therapy (HRT) to treat symptoms of menopause. Follow these instructions at home: Lifestyle  Do not use any products that contain nicotine or tobacco, such as cigarettes, e-cigarettes, and chewing tobacco. If you need help quitting, ask your health care provider.  Do not use street drugs.  Do not share needles.  Ask your health care provider for help if you need support or information about quitting drugs. Alcohol use  Do not drink alcohol if: ? Your health care provider tells you not to drink. ? You are pregnant, may be pregnant, or are planning to become pregnant.  If you drink alcohol: ? Limit how much you use to 0-1 drink a day. ? Limit intake if you are breastfeeding.  Be aware of how much alcohol is in your drink. In the [place] one drink equals one 12 oz bottle of beer (355 mL), one 5 oz glass of wine (148 mL), or one 1 oz glass of hard liquor (44 mL). General instructions  Schedule regular health, dental, and eye exams.  Stay current with your vaccines.  Tell your health care provider if: ? You often feel depressed. ? You have  ever been abused or do not feel safe at home. Summary  Adopting a healthy lifestyle and getting preventive care are important in promoting health and wellness.  Follow your health care provider's instructions about healthy diet, exercising, and getting tested or screened for diseases.  Follow your health care provider's instructions on monitoring your cholesterol and blood pressure. This information is not intended to replace advice given to you by your health care provider. Make sure you discuss any questions you have with your health care provider. Document Released: 03/10/2011 Document Revised: 08/18/2018 Document Reviewed: 08/18/2018 Elsevier Patient Education  2020 [employer].

## 2019-09-07 LAB — HM DEXA SCAN

## 2019-09-07 LAB — HM MAMMOGRAPHY

## 2019-09-13 ENCOUNTER — Ambulatory Visit: Payer: Medicare PPO | Attending: Internal Medicine

## 2019-09-13 DIAGNOSIS — Z20822 Contact with and (suspected) exposure to covid-19: Secondary | ICD-10-CM

## 2019-09-15 ENCOUNTER — Encounter: Payer: Self-pay | Admitting: Internal Medicine

## 2019-09-15 LAB — NOVEL CORONAVIRUS, NAA: SARS-CoV-2, NAA: NOT DETECTED

## 2019-10-10 DIAGNOSIS — Z0184 Encounter for antibody response examination: Secondary | ICD-10-CM

## 2019-10-10 NOTE — Telephone Encounter (Signed)
Ok to do this  [place]  I placed future orders    Make sure   All future orders are done so the lipid panel  And BMP is done also

## 2019-10-12 ENCOUNTER — Other Ambulatory Visit: Payer: Medicare PPO

## 2019-10-14 ENCOUNTER — Other Ambulatory Visit: Payer: Self-pay

## 2019-10-17 ENCOUNTER — Other Ambulatory Visit: Payer: Self-pay

## 2019-10-17 ENCOUNTER — Other Ambulatory Visit (INDEPENDENT_AMBULATORY_CARE_PROVIDER_SITE_OTHER): Payer: Medicare PPO

## 2019-10-17 DIAGNOSIS — M8580 Other specified disorders of bone density and structure, unspecified site: Secondary | ICD-10-CM

## 2019-10-17 DIAGNOSIS — Z0184 Encounter for antibody response examination: Secondary | ICD-10-CM | POA: Diagnosis not present

## 2019-10-17 DIAGNOSIS — E785 Hyperlipidemia, unspecified: Secondary | ICD-10-CM

## 2019-10-17 LAB — BASIC METABOLIC PANEL
BUN: 9 mg/dL (ref 6–23)
CO2: 29 mEq/L (ref 19–32)
Calcium: 9.3 mg/dL (ref 8.4–10.5)
Chloride: 101 mEq/L (ref 96–112)
Creatinine, Ser: 0.58 mg/dL (ref 0.40–1.20)
GFR: 101.09 mL/min (ref >60.00)
Glucose, Bld: 99 mg/dL (ref 70–99)
Potassium: 4.6 mEq/L (ref 3.5–5.1)
Sodium: 138 mEq/L (ref 135–145)

## 2019-10-17 LAB — LIPID PANEL
Cholesterol: 256 mg/dL — ABNORMAL HIGH (ref 0–200)
HDL: 71.2 mg/dL (ref >39.00)
LDL Cholesterol: 167 mg/dL — ABNORMAL HIGH (ref 0–99)
NonHDL: 184.48
Total CHOL/HDL Ratio: 4
Triglycerides: 86 mg/dL (ref 0.0–149.0)
VLDL: 17.2 mg/dL (ref 0.0–40.0)

## 2019-10-20 NOTE — Progress Notes (Signed)
Lipids about the same  stable  The 10-year ASCVD risk score Denman George DC Montez Hageman., et al., 2013) is: 12.5%   Values used to calculate the score:     Age: 74 years     Sex: Female     Is Non-Hispanic African American: No     Diabetic: No     Tobacco smoker: No     Systolic Blood Pressure: 110 mmHg     Is BP treated: No     HDL Cholesterol: 71.2 mg/dL     Total Cholesterol: 256 mg/dL

## 2020-03-08 DIAGNOSIS — U071 COVID-19: Secondary | ICD-10-CM

## 2020-03-08 HISTORY — DX: COVID-19: U07.1

## 2020-03-21 DIAGNOSIS — U071 COVID-19: Secondary | ICD-10-CM | POA: Diagnosis not present

## 2020-03-21 DIAGNOSIS — Z20822 Contact with and (suspected) exposure to covid-19: Secondary | ICD-10-CM | POA: Diagnosis not present

## 2020-04-25 ENCOUNTER — Ambulatory Visit: Payer: Medicare PPO

## 2020-05-16 ENCOUNTER — Ambulatory Visit: Payer: Medicare PPO

## 2020-05-21 DIAGNOSIS — H04123 Dry eye syndrome of bilateral lacrimal glands: Secondary | ICD-10-CM | POA: Diagnosis not present

## 2020-05-21 DIAGNOSIS — H5212 Myopia, left eye: Secondary | ICD-10-CM | POA: Diagnosis not present

## 2020-05-21 DIAGNOSIS — Z961 Presence of intraocular lens: Secondary | ICD-10-CM | POA: Diagnosis not present

## 2020-05-21 DIAGNOSIS — H353131 Nonexudative age-related macular degeneration, bilateral, early dry stage: Secondary | ICD-10-CM | POA: Diagnosis not present

## 2020-05-23 ENCOUNTER — Ambulatory Visit (INDEPENDENT_AMBULATORY_CARE_PROVIDER_SITE_OTHER): Payer: Medicare PPO

## 2020-05-23 ENCOUNTER — Other Ambulatory Visit: Payer: Self-pay

## 2020-05-23 DIAGNOSIS — Z0000 Encounter for general adult medical examination without abnormal findings: Secondary | ICD-10-CM

## 2020-05-23 NOTE — Progress Notes (Signed)
Virtual Visit via Telephone Note  I connected with  Nichole Berg on 05/23/20 at  9:30 AM EDT by telephone and verified that I am speaking with the correct person using two identifiers.  Medicare Annual Wellness visit completed telephonically due to Covid-19 pandemic.   Persons participating in this call: This Health Coach and this patient.   Location: Patient: Home Provider: Office   I discussed the limitations, risks, security and privacy concerns of performing an evaluation and management service by telephone and the availability of in person appointments. The patient expressed understanding and agreed to proceed.  Unable to perform video visit due to video visit attempted and failed and/or patient does not have video capability.   Some vital signs may be absent or patient reported.   Marzella Schleinina H Patrizia Paule, LPN    Subjective:   Nichole Berg is a 74 y.o. female who presents for Medicare Annual (Subsequent) preventive examination.  Review of Systems     Cardiac Risk Factors include: advanced age (>5955men, 55>65 women);dyslipidemia     Objective:    There were no vitals filed for this visit. There is no height or weight on file to calculate BMI.  Advanced Directives 05/23/2020 06/19/2019 06/19/2019  Does Patient Have a Medical Advance Directive? Yes No No  Type of Estate agentAdvance Directive Healthcare Power of HoncutAttorney;Living will - -  Does patient want to make changes to medical advance directive? - No - Patient declined -  Copy of Healthcare Power of Attorney in Chart? No - copy requested - -  Would patient like information on creating a medical advance directive? - No - Patient declined No - Patient declined    Current Medications (verified) Outpatient Encounter Medications as of 05/23/2020  Medication Sig  . Artificial Tear Ointment (DRY EYES OP) Apply 1 drop to eye daily as needed (dry eyes).  . Cholecalciferol (VITAMIN D) 2000 UNITS tablet Take 2,000 Units by mouth daily.    . melatonin  5 MG TABS Take 5 mg by mouth.  . Misc Natural Products (TART CHERRY ADVANCED PO) Take 800 mg by mouth daily.  . Multiple Vitamins-Minerals (ICAPS) CAPS Take by mouth.  . naproxen sodium (ALEVE) 220 MG tablet Take 220 mg by mouth daily as needed (finger pain).   No facility-administered encounter medications on file as of 05/23/2020.    Allergies (verified) Sulfonamide derivatives   History: Past Medical History:  Diagnosis Date  . Cataract 10/18/2011   Followed by opthalmology.   . COLONIC POLYPS, HX OF 10/12/2009  . COVID-19 03/2020  . HYPERLIPIDEMIA 05/31/2007  . NEPHROLITHIASIS, HX OF 05/31/2007   Past Surgical History:  Procedure Laterality Date  . CATARACT EXTRACTION, BILATERAL     Family History  Problem Relation Age of Onset  . Atrial fibrillation Mother        Has pacer doing very well age 74 passed suddnely age 74 poss CV   Social History   Socioeconomic History  . Marital status: Single    Spouse name: Not on file  . Number of children: Not on file  . Years of education: Not on file  . Highest education level: Not on file  Occupational History  . Occupation: Retired  Tobacco Use  . Smoking status: Former Smoker    Years: 2.00    Types: Cigarettes    Quit date: 09/08/1964    Years since quitting: 55.7  . Smokeless tobacco: Never Used  Vaping Use  . Vaping Use: Never used  Substance and Sexual Activity  .  Alcohol use: Yes    Comment: occassional  . Drug use: No  . Sexual activity: Not on file  Other Topics Concern  . 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 Difficulty of Paying Living Expenses: Not hard at all  Food Insecurity: No Food Insecurity  . Worried About Programme researcher, broadcasting/film/video in the Last Year: Never true  . Ran Out of Food in the Last Year: Never true    Transportation Needs: No Transportation Needs  . Lack of Transportation (Medical): No  . Lack of Transportation (Non-Medical): No  Physical Activity: Sufficiently Active  . Days of Exercise per Week: 7 days  . Minutes of Exercise per Session: 90 min  Stress: No Stress Concern Present  . Feeling of Stress : Only a little  Social Connections: Moderately Isolated  . Frequency of Communication with Friends and Family: More than three times a week  . Frequency of Social Gatherings with Friends and Family: Once a week  . Attends Religious Services: Never  . Active Member of Clubs or Organizations: Yes  . Attends Banker Meetings: 1 to 4 times per year  . Marital Status: Never married    Tobacco Counseling Counseling given: Not Answered   Clinical Intake:  Pre-visit preparation completed: Yes  Pain : No/denies pain     BMI - recorded: 24.48 Nutritional Status: BMI of 19-24  Normal Nutritional Risks: None Diabetes: No  How often do you need to have someone help you when you read instructions, pamphlets, or other written materials from your doctor or pharmacy?: 1 - Never  Diabetic?No  Interpreter Needed?: No  Information entered by :: Lanier Ensign, LPN   Activities of Daily Living In your present state of health, do you have any difficulty performing the following activities: 05/23/2020  Hearing? N  Vision? N  Difficulty concentrating or making decisions? N  Walking or climbing stairs? N  Dressing or bathing? N  Doing errands, shopping? N  Preparing Food and eating ? N  Using the Toilet? N  In the past six months, have you accidently leaked urine? Y  Comment urgency at times and liners at times  Do you have problems with loss of bowel control? N  Managing your Medications? N  Managing your Finances? N  Housekeeping or managing your Housekeeping? N  Some recent data might be hidden    Patient Care Team: Panosh, [name], MD as PCP - General Sharrell Ku, MD as Consulting Physician (Gastroenterology) Mckinley Jewel, MD as Consulting Physician (Ophthalmology)  Indicate any recent Medical Services you may have received from other than [facility] providers in the past year (date may be approximate).     Assessment:   This is a routine wellness examination for Nichole Berg.  Hearing/Vision screen  Hearing Screening   125Hz  250Hz  500Hz  1000Hz  2000Hz  3000Hz  4000Hz  6000Hz  8000Hz   Right ear:           Left ear:           Comments: Pt denies any difficulty hearing  Vision Screening Comments: Follows up with for annual eye exams  Dietary issues and exercise activities discussed: Current Exercise Habits: Home exercise routine, Type of exercise: walking, Time (  Minutes): > 60, Frequency (Times/Week): 7, Weekly Exercise (Minutes/Week): 0  Goals    . Patient Stated     Stay healthy and active      Depression Screen PHQ 2/9 Scores 05/23/2020 08/10/2019 07/28/2018 05/25/2017 02/06/2016 06/16/2014  PHQ - 2 Score 0 0 0 0 0 0    Fall Risk Fall Risk  05/23/2020 08/10/2019 07/28/2018 02/06/2016 06/16/2014  Falls in the past year? 0 0 0 No No  Number falls in past yr: 0 0 - - -  Injury with Fall? 0 0 - - -  Risk for fall due to : No Fall Risks - - - -  Follow up Falls prevention discussed Falls evaluation completed - - -    Any stairs in or around the home? Yes  If so, are there any without handrails? No  Home free of loose throw rugs in walkways, pet beds, electrical cords, etc? Yes  Adequate lighting in your home to reduce risk of falls? Yes   ASSISTIVE DEVICES UTILIZED TO PREVENT FALLS:  Life alert? No  Use of a cane, walker or w/c? No  Grab bars in the bathroom? Yes  Shower chair or bench in shower? No  Elevated toilet seat or a handicapped toilet? Yes   TIMED UP AND GO:  Was the test performed? No .    Cognitive Function:     6CIT Screen 05/23/2020  Count back from 20 0 points  Months in reverse 0 points  Repeat  phrase 2 points    Immunizations Immunization History  Administered Date(s) Administered  . Influenza Whole 08/14/2010  . Influenza, High Dose Seasonal PF 07/23/2016, 07/12/2019  . Influenza,inj,Quad PF,6+ Mos 06/16/2014  . Influenza-Unspecified 06/23/2017  . PFIZER SARS-COV-2 Vaccination 09/21/2019, 10/12/2019  . Pneumococcal Conjugate-13 06/16/2014  . Pneumococcal Polysaccharide-23 10/12/2009  . Td 09/09/2003, 10/12/2009  . Zoster Recombinat (Shingrix) 10/20/2016, 06/23/2017, 10/20/2017    TDAP status: Due, Education has been provided regarding the importance of this vaccine. Advised may receive this vaccine at local pharmacy or Health Dept. Aware to provide a copy of the vaccination record if obtained from local pharmacy or Health Dept. Verbalized acceptance and understanding. Flu Vaccine status: Up to date Pneumococcal vaccine status: Up to date Covid-19 vaccine status: Completed vaccines  Qualifies for Shingles Vaccine? Yes   Zostavax completed Yes   Shingrix Completed?: Yes  Screening Tests Health Maintenance  Topic Date Due  . TETANUS/TDAP  10/13/2019  . INFLUENZA VACCINE  04/08/2020  . DEXA SCAN  Completed  . COVID-19 Vaccine  Completed  . Hepatitis C Screening  Completed  . PNA vac Low Risk Adult  Completed    Health Maintenance  Health Maintenance Due  Topic Date Due  . TETANUS/TDAP  10/13/2019  . INFLUENZA VACCINE  04/08/2020    Colorectal cancer screening: Completed 12/22/11. Repeat every 10 years Mammogram status: Completed 09/07/19. Repeat every year Bone Density status: Completed 09/07/19. Results reflect: Bone density results: OSTEOPENIA. Repeat every 2 years.   Additional Screening:  Hepatitis C Screening: Completed 05/25/17  Vision Screening: Recommended annual ophthalmology exams for early detection of glaucoma and other disorders of the eye. Is the patient up to date with their annual eye exam?  Yes  Who is the provider or what is the name of  the office in which the patient attends annual eye exams? Dr Maryelizabeth Kaufmann   Dental Screening: Recommended annual dental exams for proper oral hygiene  Community Resource Referral / Chronic Care Management: CRR required this visit?  No   CCM required this visit?  No      Plan:     I have personally reviewed and noted the following in the patient's chart:   . Medical and social history . Use of alcohol, tobacco or illicit drugs  . Current medications and supplements . Functional ability and status . Nutritional status . Physical activity . Advanced directives . List of other physicians . Hospitalizations, surgeries, and ER visits in previous 12 months . Vitals . Screenings to include cognitive, depression, and falls . Referrals and appointments  In addition, I have reviewed and discussed with patient certain preventive protocols, quality metrics, and best practice recommendations. A written personalized care plan for preventive services as well as general preventive health recommendations were provided to patient.     Marzella Schlein, LPN   [id number]/00/[id number]   Nurse Notes: None

## 2020-05-23 NOTE — Patient Instructions (Addendum)
[name] , Thank you for taking time to come for your Medicare Wellness Visit. I appreciate your ongoing commitment to your health goals. Please review the following plan we discussed and let me know if I can assist you in the future.   Screening recommendations/referrals: Colonoscopy: Done 12/22/11 Mammogram: Done 09/07/19 Bone Density: Done 09/07/19 Recommended yearly ophthalmology/optometry visit for glaucoma screening and checkup Recommended yearly dental visit for hygiene and checkup  Vaccinations: Influenza vaccine: Up to date Pneumococcal vaccine: Up to date Tdap vaccine: Due and Discussed Shingles vaccine: Completed 10/20/16, 06/23/17 & 10/20/17   Covid-19:Completed 1/13 & 10/12/19  Advanced directives: Please bring a copy of your health care power of attorney and living will to the office at your convenience.   Conditions/risks identified: Stay Healthy and active   Next appointment: Follow up in one year for your annual wellness visit    Preventive Care 65 Years and Older, Female Preventive care refers to lifestyle choices and visits with your health care provider that can promote health and wellness. What does preventive care include?  A yearly physical exam. This is also called an annual well check.  Dental exams once or twice a year.  Routine eye exams. Ask your health care provider how often you should have your eyes checked.  Personal lifestyle choices, including:  Daily care of your teeth and gums.  Regular physical activity.  Eating a healthy diet.  Avoiding tobacco and drug use.  Limiting alcohol use.  Practicing safe sex.  Taking low-dose aspirin every day.  Taking vitamin and mineral supplements as recommended by your health care provider. What happens during an annual well check? The services and screenings done by your health care provider during your annual well check will depend on your age, overall health, lifestyle risk factors, and family  history of disease. Counseling  Your health care provider may ask you questions about your:  Alcohol use.  Tobacco use.  Drug use.  Emotional well-being.  Home and relationship well-being.  Sexual activity.  Eating habits.  History of falls.  Memory and ability to understand (cognition).  Work and work Astronomer.  Reproductive health. Screening  You may have the following tests or measurements:  Height, weight, and BMI.  Blood pressure.  Lipid and cholesterol levels. These may be checked every 5 years, or more frequently if you are over 57 years old.  Skin check.  Lung cancer screening. You may have this screening every year starting at age 51 if you have a 30-pack-year history of smoking and currently smoke or have quit within the past 15 years.  Fecal occult blood test (FOBT) of the stool. You may have this test every year starting at age 15.  Flexible sigmoidoscopy or colonoscopy. You may have a sigmoidoscopy every 5 years or a colonoscopy every 10 years starting at age 62.  Hepatitis C blood test.  Hepatitis B blood test.  Sexually transmitted disease (STD) testing.  Diabetes screening. This is done by checking your blood sugar (glucose) after you have not eaten for a while (fasting). You may have this done every 1-3 years.  Bone density scan. This is done to screen for osteoporosis. You may have this done starting at age 45.  Mammogram. This may be done every 1-2 years. Talk to your health care provider about how often you should have regular mammograms. Talk with your health care provider about your test results, treatment options, and if necessary, the need for more tests. Vaccines  Your health  care provider may recommend certain vaccines, such as:  Influenza vaccine. This is recommended every year.  Tetanus, diphtheria, and acellular pertussis (Tdap, Td) vaccine. You may need a Td booster every 10 years.  Zoster vaccine. You may need this after  age 55.  Pneumococcal 13-valent conjugate (PCV13) vaccine. One dose is recommended after age 23.  Pneumococcal polysaccharide (PPSV23) vaccine. One dose is recommended after age 55. Talk to your health care provider about which screenings and vaccines you need and how often you need them. This information is not intended to replace advice given to you by your health care provider. Make sure you discuss any questions you have with your health care provider. Document Released: 09/21/2015 Document Revised: 05/14/2016 Document Reviewed: 06/26/2015 Elsevier Interactive Patient Education  2017 [place] Prevention in the Home Falls can cause injuries. They can happen to people of all ages. There are many things you can do to make your home safe and to help prevent falls. What can I do on the outside of my home?  Regularly fix the edges of walkways and driveways and fix any cracks.  Remove anything that might make you trip as you walk through a door, such as a raised step or threshold.  Trim any bushes or trees on the path to your home.  Use bright outdoor lighting.  Clear any walking paths of anything that might make someone trip, such as rocks or tools.  Regularly check to see if handrails are loose or broken. Make sure that both sides of any steps have handrails.  Any raised decks and porches should have guardrails on the edges.  Have any leaves, snow, or ice cleared regularly.  Use sand or salt on walking paths during winter.  Clean up any spills in your garage right away. This includes oil or grease spills. What can I do in the bathroom?  Use night lights.  Install grab bars by the toilet and in the tub and shower. Do not use towel bars as grab bars.  Use non-skid mats or decals in the tub or shower.  If you need to sit down in the shower, use a plastic, non-slip stool.  Keep the floor dry. Clean up any water that spills on the floor as soon as it  happens.  Remove soap buildup in the tub or shower regularly.  Attach bath mats securely with double-sided non-slip rug tape.  Do not have throw rugs and other things on the floor that can make you trip. What can I do in the bedroom?  Use night lights.  Make sure that you have a light by your bed that is easy to reach.  Do not use any sheets or blankets that are too big for your bed. They should not hang down onto the floor.  Have a firm chair that has side arms. You can use this for support while you get dressed.  Do not have throw rugs and other things on the floor that can make you trip. What can I do in the kitchen?  Clean up any spills right away.  Avoid walking on wet floors.  Keep items that you use a lot in easy-to-reach places.  If you need to reach something above you, use a strong step stool that has a grab bar.  Keep electrical cords out of the way.  Do not use floor polish or wax that makes floors slippery. If you must use wax, use non-skid floor wax.  Do not have  throw rugs and other things on the floor that can make you trip. What can I do with my stairs?  Do not leave any items on the stairs.  Make sure that there are handrails on both sides of the stairs and use them. Fix handrails that are broken or loose. Make sure that handrails are as long as the stairways.  Check any carpeting to make sure that it is firmly attached to the stairs. Fix any carpet that is loose or worn.  Avoid having throw rugs at the top or bottom of the stairs. If you do have throw rugs, attach them to the floor with carpet tape.  Make sure that you have a light switch at the top of the stairs and the bottom of the stairs. If you do not have them, ask someone to add them for you. What else can I do to help prevent falls?  Wear shoes that:  Do not have high heels.  Have rubber bottoms.  Are comfortable and fit you well.  Are closed at the toe. Do not wear sandals.  If you  use a stepladder:  Make sure that it is fully opened. Do not climb a closed stepladder.  Make sure that both sides of the stepladder are locked into place.  Ask someone to hold it for you, if possible.  Clearly mark and make sure that you can see:  Any grab bars or handrails.  First and last steps.  Where the edge of each step is.  Use tools that help you move around (mobility aids) if they are needed. These include:  Canes.  Walkers.  Scooters.  Crutches.  Turn on the lights when you go into a dark area. Replace any light bulbs as soon as they burn out.  Set up your furniture so you have a clear path. Avoid moving your furniture around.  If any of your floors are uneven, fix them.  If there are any pets around you, be aware of where they are.  Review your medicines with your doctor. Some medicines can make you feel dizzy. This can increase your chance of falling. Ask your doctor what other things that you can do to help prevent falls. This information is not intended to replace advice given to you by your health care provider. Make sure you discuss any questions you have with your health care provider. Document Released: 06/21/2009 Document Revised: 01/31/2016 Document Reviewed: 09/29/2014 Elsevier Interactive Patient Education  2017 [employer].

## 2020-06-12 ENCOUNTER — Encounter: Payer: Self-pay | Admitting: Family Medicine

## 2020-06-12 ENCOUNTER — Ambulatory Visit: Payer: Self-pay

## 2020-06-12 ENCOUNTER — Ambulatory Visit (INDEPENDENT_AMBULATORY_CARE_PROVIDER_SITE_OTHER): Payer: Medicare PPO | Admitting: Family Medicine

## 2020-06-12 ENCOUNTER — Ambulatory Visit (INDEPENDENT_AMBULATORY_CARE_PROVIDER_SITE_OTHER): Payer: Medicare PPO

## 2020-06-12 ENCOUNTER — Other Ambulatory Visit: Payer: Self-pay

## 2020-06-12 VITALS — BP 122/88 | HR 65 | Ht 64.0 in | Wt 142.0 lb

## 2020-06-12 DIAGNOSIS — M47812 Spondylosis without myelopathy or radiculopathy, cervical region: Secondary | ICD-10-CM | POA: Diagnosis not present

## 2020-06-12 DIAGNOSIS — G8929 Other chronic pain: Secondary | ICD-10-CM

## 2020-06-12 DIAGNOSIS — M25511 Pain in right shoulder: Secondary | ICD-10-CM

## 2020-06-12 MED ORDER — GABAPENTIN 100 MG PO CAPS: 100.0000 mg | ORAL_CAPSULE | Freq: Every day | ORAL | 0 refills | Status: DC

## 2020-06-12 NOTE — Progress Notes (Signed)
[name] [address] [place] [address] Phone: [phone] Subjective:   Bruce Donath, am serving as a scribe for Dr. Antoine Primas. This visit occurred during the SARS-CoV-2 public health emergency.  Safety protocols were in place, including screening questions prior to the visit, additional usage of staff PPE, and extensive cleaning of exam room while observing appropriate contact time as indicated for disinfecting solutions.   I'm seeing this patient by the request  of:  Panosh, [name], MD  CC: Shoulder pain right-sided  [id number]  Nichole Berg is a 74 y.o. female coming in with complaint of shoulder pain. Previously seen in 2019 for adhesive capsulitis, left. Patient states that years ago she had small rotator cuff tears in the right shoulder per an ortho that she saw. No new injury. Pain has been increasing recently. Pain with external rotation. Feels pain over top of shoulder that radiates down into the tricep. Using ice and Aleve for pain relief. Having a hard time sleeping. Using one gabapentin, one time a week.        Past Medical History:  Diagnosis Date  . Cataract 10/18/2011   Followed by opthalmology.   . COLONIC POLYPS, HX OF 10/12/2009  . COVID-19 03/2020  . HYPERLIPIDEMIA 05/31/2007  . NEPHROLITHIASIS, HX OF 05/31/2007   Past Surgical History:  Procedure Laterality Date  . CATARACT EXTRACTION, BILATERAL     Social History   Socioeconomic History  . Marital status: Single    Spouse name: Not on file  . Number of children: Not on file  . Years of education: Not on file  . Highest education level: Not on file  Occupational History  . Occupation: Retired  Tobacco Use  . Smoking status: Former Smoker    Years: 2.00    Types: Cigarettes    Quit date: 09/08/1964    Years since quitting: 55.7  . Smokeless tobacco: Never Used  Vaping Use  . Vaping Use: Never used  Substance and Sexual Activity  . Alcohol use: Yes     Comment: occassional  . Drug use: No  . Sexual activity: Not on file  Other Topics Concern  . 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 Difficulty of Paying Living Expenses: Not hard at all  Food Insecurity: No Food Insecurity  . Worried About Programme researcher, broadcasting/film/video in the Last Year: Never true  . Ran Out of Food in the Last Year: Never true  Transportation Needs: No Transportation Needs  . Lack of Transportation (Medical): No  . Lack of Transportation (Non-Medical): No  Physical Activity: Sufficiently Active  . Days of Exercise per Week: 7 days  . Minutes of Exercise per Session: 90 min  Stress: No Stress Concern Present  . Feeling of Stress : Only a little  Social Connections: Moderately Isolated  . Frequency of Communication with Friends and Family: More than three times a week  . Frequency of Social Gatherings with Friends and Family: Once a week  . Attends Religious Services: Never  . Active Member of Clubs or Organizations: Yes  . Attends Banker Meetings: 1 to 4 times per year  . Marital Status: Never married  Allergies  Allergen Reactions  . Sulfonamide Derivatives    Family History  Problem Relation Age of Onset  . Atrial fibrillation Mother        Has pacer doing very well age 72 passed suddnely age 27 poss CV       Current Outpatient Medications (Analgesics):  .  naproxen sodium (ALEVE) 220 MG tablet, Take 220 mg by mouth daily as needed (finger pain).   Current Outpatient Medications (Other):  Marland Kitchen  Artificial Tear Ointment (DRY EYES OP), Apply 1 drop to eye daily as needed (dry eyes). .  Cholecalciferol (VITAMIN D) 2000 UNITS tablet, Take 2,000 Units by mouth daily.   .  melatonin 5 MG TABS, Take 5 mg by mouth. .  Misc Natural Products (TART CHERRY ADVANCED  PO), Take 800 mg by mouth daily. .  Multiple Vitamins-Minerals (ICAPS) CAPS, Take by mouth. .  gabapentin (NEURONTIN) 100 MG capsule, Take 1 capsule (100 mg total) by mouth at bedtime.   Reviewed prior external information including notes and imaging from  primary care provider As well as notes that were available from care everywhere and other healthcare systems.  Past medical history, social, surgical and family history all reviewed in electronic medical record.  No pertanent information unless stated regarding to the chief complaint.   Review of Systems:  No headache, visual changes, nausea, vomiting, diarrhea, constipation, dizziness, abdominal pain, skin rash, fevers, chills, night sweats, weight loss, swollen lymph nodes, body aches, joint swelling, chest pain, shortness of breath, mood changes. POSITIVE muscle aches  Objective  Blood pressure 122/88, pulse 65, height 5\' 4"  (1.626 m), weight 142 lb (64.4 kg), SpO2 98 %.   General: No apparent distress alert and oriented x3 mood and affect normal, dressed appropriately.  HEENT: Pupils equal, extraocular movements intact  Respiratory: Patient's speak in full sentences and does not appear short of breath  Cardiovascular: No lower extremity edema, non tender, no erythema  Neuro: Cranial nerves II through XII are intact, neurovascularly intact in all extremities with 2+ DTRs and 2+ pulses.  Gait normal with good balance and coordination.  MSK: Arthritic changes of multiple joints Right shoulder does have a positive O'Brien's.  Patient does have some limited external rotation.  4+ out of 5 strength of the rotator cuff.  Mild limited range of motion with the neck especially with side bending.  Limited musculoskeletal ultrasound was performed and interpreted by  Limited ultrasound of patient's right shoulder shows that patient's rotator cuff has some degenerative changes noted that seems to be chronic of the supraspinatus.   Patient does have very minimal thinning of the glenohumeral joint.  Patient does have significant calcific changes of the posterior labrum and patient's acromioclavicular joint has mild arthritic changes.  Procedure: Real-time Ultrasound Guided Injection of right glenohumeral joint Device: GE Logiq Q7  Ultrasound guided injection is preferred based studies that show increased duration, increased effect, greater accuracy, decreased procedural pain, increased response rate with ultrasound guided versus blind injection.  Verbal informed consent obtained.  Time-out conducted.  Noted no overlying erythema, induration, or other signs of local infection.  Skin prepped in a sterile fashion.  Local anesthesia: Topical Ethyl chloride.  With sterile technique and under real time ultrasound guidance:  Joint visualized.  23g 1  inch needle inserted posterior approach. Pictures taken for needle placement. Patient did have injection of 2 cc of 1% lidocaine, 2 cc of 0.5% Marcaine, and 1.0 cc of Kenalog 40 mg/dL. Completed without difficulty  Pain immediately resolved suggesting accurate placement of the medication.  Advised to call if fevers/chills, erythema, induration, drainage, or persistent bleeding.  Impression: Technically successful ultrasound guided injection.    Impression and Recommendations:     The above documentation has been reviewed and is accurate and complete Judi Saa, DO

## 2020-06-12 NOTE — Assessment & Plan Note (Signed)
Patient given injection today.  Patient does have findings with [name] had a chronic rotator cuff tear with mild fat atrophy.  Patient has minimal arthritic changes of the glenohumeral joint but does have some calcific changes of the posterior labrum.  Injection today and hopefully this will be beneficial home exercise, x-rays pending, topical anti-inflammatories, gabapentin secondary to the differential and the cervical radiculopathy follow-up again 6 weeks

## 2020-06-12 NOTE — Patient Instructions (Addendum)
Xray today Gabapentin 100mg  at night Exercises 3x a week Ice 20 min at end of day See me again in 5-6 weeks

## 2020-06-20 ENCOUNTER — Encounter: Payer: Self-pay | Admitting: Family Medicine

## 2020-06-25 ENCOUNTER — Emergency Department (HOSPITAL_COMMUNITY)
Admission: EM | Admit: 2020-06-25 | Discharge: 2020-06-26 | Disposition: A | Discharge status: Home / Self Care | Payer: Medicare PPO | Attending: Emergency Medicine | Admitting: Emergency Medicine

## 2020-06-25 ENCOUNTER — Emergency Department (HOSPITAL_COMMUNITY): Payer: Medicare PPO

## 2020-06-25 ENCOUNTER — Other Ambulatory Visit: Payer: Self-pay

## 2020-06-25 ENCOUNTER — Ambulatory Visit (HOSPITAL_COMMUNITY)
Admission: EM | Admit: 2020-06-25 | Discharge: 2020-06-25 | Disposition: A | Discharge status: Home / Self Care | Payer: Medicare PPO | Attending: Family Medicine | Admitting: Family Medicine

## 2020-06-25 DIAGNOSIS — Z5321 Procedure and treatment not carried out due to patient leaving prior to being seen by health care provider: Secondary | ICD-10-CM | POA: Diagnosis not present

## 2020-06-25 DIAGNOSIS — R42 Dizziness and giddiness: Secondary | ICD-10-CM | POA: Insufficient documentation

## 2020-06-25 DIAGNOSIS — R002 Palpitations: Secondary | ICD-10-CM | POA: Diagnosis not present

## 2020-06-25 LAB — BASIC METABOLIC PANEL
Anion gap: 13 (ref 5–15)
BUN: 13 mg/dL (ref 8–23)
CO2: 26 mmol/L (ref 22–32)
Calcium: 9.4 mg/dL (ref 8.9–10.3)
Chloride: 99 mmol/L (ref 98–111)
Creatinine, Ser: 0.69 mg/dL (ref 0.44–1.00)
GFR, Estimated: >60 mL/min (ref >60)
Glucose, Bld: 113 mg/dL — ABNORMAL HIGH (ref 70–99)
Potassium: 4 mmol/L (ref 3.5–5.1)
Sodium: 138 mmol/L (ref 135–145)

## 2020-06-25 LAB — CBC
HCT: 42.9 % (ref 36.0–46.0)
Hemoglobin: 13.6 g/dL (ref 12.0–15.0)
MCH: 31.3 pg (ref 26.0–34.0)
MCHC: 31.7 g/dL (ref 30.0–36.0)
MCV: 98.6 fL (ref 80.0–100.0)
Platelets: 302 10*3/uL (ref 150–400)
RBC: 4.35 MIL/uL (ref 3.87–5.11)
RDW: 12.7 % (ref 11.5–15.5)
WBC: 6.7 10*3/uL (ref 4.0–10.5)
nRBC: 0 % (ref 0.0–0.2)

## 2020-06-25 LAB — TROPONIN I (HIGH SENSITIVITY): Troponin I (High Sensitivity): 10 ng/L (ref <18)

## 2020-06-25 NOTE — ED Notes (Signed)
Pt. Concerned about the wait , wanted her vitals taken again.

## 2020-06-25 NOTE — Discharge Instructions (Addendum)
Go to ER

## 2020-06-25 NOTE — ED Notes (Signed)
Report called to [place], Consulting civil engineer of [facility] ED and notified of pt new onset of Afib with RVR and  170 ventricular rate, ST depression, new onset HTN, dizziness. [place] stated he would pass info on to relieving Consulting civil engineer.

## 2020-06-25 NOTE — ED Triage Notes (Signed)
Pt reports palpitations or irregular heart rhythm with dizziness that started a few days ago, denies chest pain. Pt a.o, nad noted.

## 2020-06-25 NOTE — ED Provider Notes (Signed)
BP (!) 143/104 (BP Location: Right Arm)   Pulse 91   Temp 97.8 F (36.6 C) (Oral)   Resp 18   SpO2 98%   Patient has been experiencing dizziness off and on for the last 4 days.  When she took her blood pressure this morning the blood pressure machine said her pulse was over 160.  She was feeling dizzy at the time.  She is here for evaluation.  She does not have shortness of breath or chest pressure or pain.  No history of heart disease.  EKG is reviewed.  She does have new onset of atrial flutter with a variable rate, 140s to 160s had a.  There is some ST and T wave changes, difficult to appreciate but possible subendocardial injury.  Patient was advised to go to the emergency room via EMS. 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  She understands the risks.   Eustace Moore, MD 06/25/20 1450

## 2020-06-25 NOTE — ED Notes (Signed)
Patient states she will check her mychart for her troponin but she would follow up with cardiology and her primary

## 2020-06-25 NOTE — ED Triage Notes (Signed)
Pt c/o heart irregular beats with "fast beats" for approx 4 days, accompanied with intermittent dizziness.  Also reports trending elevated SBP in the 140s/150s the past few months, which is high for pt.  Denies CP, SOB, nausea, diaphoresis, back/jaw pain.   Pt had COVID in July. EKG performed, and results given to Dr. Delton See who came in at bedside for eval.  Dr. Delton See advised pt to go to ED STAT for cardiac evaluation 2/2 new onset A-fib with RVR, HTN. Pt declined EMS transport and wants to have her partner take her to ED.  Advised pt to notify ER staff STAT if she develops any additional symptoms such as CP, SOB, nausea, diaphoresis, etc. Pt left ambulatory, in stable condition. Patient is being discharged from the Urgent Care and sent to the Emergency Department via POV. Per Dr. Delton See, patient is in need of higher level of care due to new onset A-fib with RVR, ST changes, HTN, dizziness. Patient is aware and verbalizes understanding of plan of care.  Vitals:   06/25/20 1425  BP: (!) 143/104  Pulse: 91  Resp: 18  Temp: 97.8 F (36.6 C)  SpO2: 98%

## 2020-06-26 ENCOUNTER — Telehealth: Payer: Self-pay | Admitting: Internal Medicine

## 2020-06-26 ENCOUNTER — Ambulatory Visit: Payer: Medicare PPO | Admitting: Internal Medicine

## 2020-06-26 ENCOUNTER — Encounter: Payer: Self-pay | Admitting: Internal Medicine

## 2020-06-26 VITALS — BP 136/116 | HR 76 | Temp 98.7°F | Ht 64.0 in | Wt 141.2 lb

## 2020-06-26 DIAGNOSIS — R030 Elevated blood-pressure reading, without diagnosis of hypertension: Secondary | ICD-10-CM

## 2020-06-26 DIAGNOSIS — I4891 Unspecified atrial fibrillation: Secondary | ICD-10-CM

## 2020-06-26 DIAGNOSIS — Z8249 Family history of ischemic heart disease and other diseases of the circulatory system: Secondary | ICD-10-CM

## 2020-06-26 MED ORDER — METOPROLOL TARTRATE 25 MG PO TABS: 25.0000 mg | ORAL_TABLET | Freq: Two times a day (BID) | ORAL | 1 refills | Status: DC

## 2020-06-26 NOTE — Patient Instructions (Addendum)
if HR  not coming down may   need to go back to ed for iv meds esp if light headed  Feel short of breath .  Begin metoprolol 25 mg  And repeat in 12 hour Then can decrease  To 12.5  Twice a day if rate below 120  Goal  to get rated below 120    If rate below 60 hold next dose and then  Decrease dose by 1/2 and hold one dose   Stay hydrated .  Asa  Each day 81- 100 mg   Atrial Fibrillation  Atrial fibrillation is a type of irregular or rapid heartbeat (arrhythmia). In atrial fibrillation, the top part of the heart (atria) beats in an irregular pattern. This makes the heart unable to pump blood normally and effectively. The goal of treatment is to prevent blood clots from forming, control your heart rate, or restore your heartbeat to a normal rhythm. If this condition is not treated, it can cause serious problems, such as a weakened heart muscle (cardiomyopathy) or a stroke. What are the causes? This condition is often caused by medical conditions that damage the heart's electrical system. These include:  High blood pressure (hypertension). This is the most common cause.  Certain heart problems or conditions, such as heart failure, coronary artery disease, heart valve problems, or heart surgery.  Diabetes.  Overactive thyroid (hyperthyroidism).  Obesity.  Chronic kidney disease. In some cases, the cause of this condition is not known. What increases the risk? This condition is more likely to develop in:  Older people.  People who smoke.  Athletes who do endurance exercise.  People who have a family history of atrial fibrillation.  Men.  People who use drugs.  People who drink a lot of alcohol.  People who have lung conditions, such as emphysema, pneumonia, or COPD.  People who have obstructive sleep apnea. What are the signs or symptoms? Symptoms of this condition include:  A feeling that your heart is racing or beating irregularly.  Discomfort or pain in your  chest.  Shortness of breath.  Sudden light-headedness or weakness.  Tiring easily during exercise or activity.  Fatigue.  Syncope (fainting).  Sweating. In some cases, there are no symptoms. How is this diagnosed? Your health care provider may detect atrial fibrillation when taking your pulse. If detected, this condition may be diagnosed with:  An electrocardiogram (ECG) to check electrical signals of the heart.  An ambulatory cardiac monitor to record your heart's activity for a few days.  A transthoracic echocardiogram (TTE) to create pictures of your heart.  A transesophageal echocardiogram (TEE) to create even closer pictures of your heart.  A stress test to check your blood supply while you exercise.  Imaging tests, such as a CT scan or chest X-ray.  Blood tests. How is this treated? Treatment depends on underlying conditions and how you feel when you experience atrial fibrillation. This condition may be treated with:  Medicines to prevent blood clots or to treat heart rate or heart rhythm problems.  Electrical cardioversion to reset the heart's rhythm.  A pacemaker to correct abnormal heart rhythm.  Ablation to remove the heart tissue that sends abnormal signals.  Left atrial appendage closure to seal the area where blood clots can form. In some cases, underlying conditions will be treated. Follow these instructions at home: Medicines  Take over-the counter and prescription medicines only as told by your health care provider.  Do not take any new medicines without talking  to your health care provider.  If you are taking blood thinners: ? Talk with your health care provider before you take any medicines that contain aspirin or NSAIDs, such as ibuprofen. These medicines increase your risk for dangerous bleeding. ? Take your medicine exactly as told, at the same time every day. ? Avoid activities that could cause injury or bruising, and follow instructions  about how to prevent falls. ? Wear a medical alert bracelet or carry a card that lists what medicines you take. Lifestyle      Do not use any products that contain nicotine or tobacco, such as cigarettes, e-cigarettes, and chewing tobacco. If you need help quitting, ask your health care provider.  Eat heart-healthy foods. Talk with a dietitian to make an eating plan that is right for you.  Exercise regularly as told by your health care provider.  Do not drink alcohol.  Lose weight if you are overweight.  Do not use drugs, including cannabis. General instructions  If you have obstructive sleep apnea, manage your condition as told by your health care provider.  Do not use diet pills unless your health care provider approves. Diet pills can make heart problems worse.  Keep all follow-up visits as told by your health care provider. This is important. Contact a health care provider if you:  Notice a change in the rate, rhythm, or strength of your heartbeat.  Are taking a blood thinner and you notice more bruising.  Tire more easily when you exercise or do heavy work.  Have a sudden change in weight. Get help right away if you have:   Chest pain, abdominal pain, sweating, or weakness.  Trouble breathing.  Side effects of blood thinners, such as blood in your vomit, stool, or urine, or bleeding that cannot stop.  Any symptoms of a stroke. "BE FAST" is an easy way to remember the main warning signs of a stroke: ? B - Balance. Signs are dizziness, sudden trouble walking, or loss of balance. ? E - Eyes. Signs are trouble seeing or a sudden change in vision. ? F - Face. Signs are sudden weakness or numbness of the face, or the face or eyelid drooping on one side. ? A - Arms. Signs are weakness or numbness in an arm. This happens suddenly and usually on one side of the body. ? S - Speech. Signs are sudden trouble speaking, slurred speech, or trouble understanding what people  say. ? T - Time. Time to call emergency services. Write down what time symptoms started.  Other signs of a stroke, such as: ? A sudden, severe headache with no known cause. ? Nausea or vomiting. ? Seizure. These symptoms may represent a serious problem that is an emergency. Do not wait to see if the symptoms will go away. Get medical help right away. Call your local emergency services (911 in the [place] Do not drive yourself to the hospital. Summary  Atrial fibrillation is a type of irregular or rapid heartbeat (arrhythmia).  Symptoms include a feeling that your heart is beating fast or irregularly.  You may be given medicines to prevent blood clots or to treat heart rate or heart rhythm problems.  Get help right away if you have signs or symptoms of a stroke.  Get help right away if you cannot catch your breath or have chest pain or pressure. This information is not intended to replace advice given to you by your health care provider. Make sure you discuss any questions you  have with your health care provider. Document Revised: 02/16/2019 Document Reviewed: 02/16/2019 Elsevier Patient Education  2020 [employer].

## 2020-06-26 NOTE — Telephone Encounter (Signed)
Pt is calling in stating that she would like to have a referral to a cardiologist due to her having afib she went to the ER and was seen and left after 8 hours after feeling better.  Pt would like to have a call to elaborate more on it with the CMA or provider.

## 2020-06-26 NOTE — Progress Notes (Signed)
Chief Complaint  Patient presents with  . Follow-up    ed   tachy a fib.     HPI: Nichole BlitzCarol Berg 74 y.o. come in for fu sda after [facility] ed visit  Left early for afib with rate and bp   Troponin negative  And other lab unrevealing   After 8 hours  And hr was a bit better   And felt better left ed and to fu with us and get referral to cards. See my chart message. Since then tired but no syncope cough fever did some activity  Hr goes up to 160 range  And then down some. Took asa once  NO hx of same and bp readings ok until this and went up  . Has some irreg beath like pvcs  caffien in am  Some etoh not excessive   Plan to get cardiology eval and  Fu  See my chart message  ROS: See pertinent positives and negatives per HPI. Mom had  afib in her 6390s  Past Medical History:  Diagnosis Date  . Cataract 10/18/2011   Followed by opthalmology.   . COLONIC POLYPS, HX OF 10/12/2009  . COVID-19 03/2020  . HYPERLIPIDEMIA 05/31/2007  . NEPHROLITHIASIS, HX OF 05/31/2007    Family History  Problem Relation Age of Onset  . Atrial fibrillation Mother        Has pacer doing very well age 74 passed suddnely age 76 poss CV    Social History   Socioeconomic History  . Marital status: Single    Spouse name: Not on file  . Number of children: Not on file  . Years of education: Not on file  . Highest education level: Not on file  Occupational History  . Occupation: Retired  Tobacco Use  . Smoking status: Former Smoker    Years: 2.00    Types: Cigarettes    Quit date: 09/08/1964    Years since quitting: 55.8  . Smokeless tobacco: Never Used  Vaping Use  . Vaping Use: Never used  Substance and Sexual Activity  . Alcohol use: Yes    Comment: occassional  . Drug use: No  . Sexual activity: Not on file  Other Topics Concern  . 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 Difficulty of Paying Living Expenses: Not hard at all  Food Insecurity: No Food Insecurity  . Worried About Programme researcher, broadcasting/film/videounning Out of Food in the Last Year: Never true  . Ran Out of Food in the Last Year: Never true  Transportation Needs: No Transportation Needs  . Lack of Transportation (Medical): No  . Lack of Transportation (Non-Medical): No  Physical Activity: Sufficiently Active  . Days of Exercise per Week: 7 days  . Minutes of Exercise per Session: 90 min  Stress: No Stress Concern Present  . Feeling of Stress : Only a little  Social Connections: Moderately Isolated  . Frequency of Communication with Friends and Family: More than three times a week  . Frequency of Social Gatherings with Friends and Family: Once a week  . Attends Religious Services: Never  . Active Member of Clubs or Organizations: Yes  . Attends BankerClub or Organization Meetings: 1 to 4 times per year  .  Marital Status: Never married    Outpatient Medications Prior to Visit  Medication Sig Dispense Refill  . Artificial Tear Ointment (DRY EYES OP) Apply 1 drop to eye daily as needed (dry eyes).    . Cholecalciferol (VITAMIN D) 2000 UNITS tablet Take 2,000 Units by mouth daily.      . diclofenac Sodium (VOLTAREN) 1 % GEL Apply topically 4 (four) times daily.    Marland Kitchen gabapentin (NEURONTIN) 100 MG capsule Take 1 capsule (100 mg total) by mouth at bedtime. 180 capsule 0  . melatonin 5 MG TABS Take 5 mg by mouth.    . Misc Natural Products (TART CHERRY ADVANCED PO) Take 800 mg by mouth daily.    . Multiple Vitamins-Minerals (ICAPS) CAPS Take by mouth.    . naproxen sodium (ALEVE) 220 MG tablet Take 220 mg by mouth daily as needed (finger pain).     No facility-administered medications prior to visit.     EXAM:  BP (!) 136/116 (BP Location: Right Arm, Patient Position: Sitting, Cuff Size: Normal)   Pulse 76   Temp 98.7 F (37.1 C) (Oral)   Ht 5\' 4"  (1.626 m)    Wt 141 lb 4 oz (64.1 kg)   SpO2 100%   BMI 24.25 kg/m   Body mass index is 24.25 kg/m. Pulse is 130 irreg irreg   GENERAL: vitals reviewed and listed above, alert, oriented, appears well hydrated and in no acute distress HEENT: atraumatic, conjunctiva  clear, no obvious abnormalities on inspection of external nose and ears OP : masked  NECK: no obvious masses on inspection palpation  LUNGS: clear to auscultation bilaterally, no wheezes, rales or rhonchi, good air movement CV:hr irr ir  Rate 140 t80 range varying no clubbing cyanosis or  peripheral edema nl cap refill  MS: moves all extremities without noticeable focal  abnormality PSYCH: pleasant and cooperative, no obvious depression or anxiety Lab Results  Component Value Date   WBC 6.7 06/25/2020   HGB 13.6 06/25/2020   HCT 42.9 06/25/2020   PLT 302 06/25/2020   GLUCOSE 113 (H) 06/25/2020   CHOL 256 (H) 10/17/2019   TRIG 86.0 10/17/2019   HDL 71.20 10/17/2019   LDLDIRECT 160.3 10/13/2011   LDLCALC 167 (H) 10/17/2019   ALT 20 07/28/2018   AST 30 07/28/2018   NA 138 06/25/2020   K 4.0 06/25/2020   CL 99 06/25/2020   CREATININE 0.69 06/25/2020   BUN 13 06/25/2020   CO2 26 06/25/2020   TSH 1.55 07/28/2018   HGBA1C 5.5 05/25/2017   BP Readings from Last 3 Encounters:  06/26/20 (!) 136/116  06/25/20 (!) 140/98  06/25/20 (!) 143/104   ekg  Afbi with rates 85 95 and 103 with  A pvc  ASSESSMENT AND PLAN:  Discussed the following assessment and plan:  Atrial fibrillation with rapid ventricular response (HCC) - Plan: EKG 12-Lead, Ambulatory referral to Cardiology  Elevated blood pressure reading - Plan: EKG 12-Lead, Ambulatory referral to Cardiology  Family history of atrial fibrillation Tolerating well so far but cautioned about decompensation  If continued high rate  Begin betablocker and refer to cards but if getting sob light headed then need to go back to ed .  Take asa  Will need more evaluation echo  troponin  were negative  -Patient advised to return or notify health care team  if  new concerns arise.  Patient Instructions   if HR  not coming down may   need to go back to ed  for iv meds esp if light headed  Feel short of breath .  Begin metoprolol 25 mg  And repeat in 12 hour Then can decrease  To 12.5  Twice a day if rate below 120  Goal  to get rated below 120    If rate below 60 hold next dose and then  Decrease dose by 1/2 and hold one dose   Stay hydrated .  Asa  Each day 81- 100 mg   Atrial Fibrillation  Atrial fibrillation is a type of irregular or rapid heartbeat (arrhythmia). In atrial fibrillation, the top part of the heart (atria) beats in an irregular pattern. This makes the heart unable to pump blood normally and effectively. The goal of treatment is to prevent blood clots from forming, control your heart rate, or restore your heartbeat to a normal rhythm. If this condition is not treated, it can cause serious problems, such as a weakened heart muscle (cardiomyopathy) or a stroke. What are the causes? This condition is often caused by medical conditions that damage the heart's electrical system. These include:  High blood pressure (hypertension). This is the most common cause.  Certain heart problems or conditions, such as heart failure, coronary artery disease, heart valve problems, or heart surgery.  Diabetes.  Overactive thyroid (hyperthyroidism).  Obesity.  Chronic kidney disease. In some cases, the cause of this condition is not known. What increases the risk? This condition is more likely to develop in:  Older people.  People who smoke.  Athletes who do endurance exercise.  People who have a family history of atrial fibrillation.  Men.  People who use drugs.  People who drink a lot of alcohol.  People who have lung conditions, such as emphysema, pneumonia, or COPD.  People who have obstructive sleep apnea. What are the signs or symptoms? Symptoms of  this condition include:  A feeling that your heart is racing or beating irregularly.  Discomfort or pain in your chest.  Shortness of breath.  Sudden light-headedness or weakness.  Tiring easily during exercise or activity.  Fatigue.  Syncope (fainting).  Sweating. In some cases, there are no symptoms. How is this diagnosed? Your health care provider may detect atrial fibrillation when taking your pulse. If detected, this condition may be diagnosed with:  An electrocardiogram (ECG) to check electrical signals of the heart.  An ambulatory cardiac monitor to record your heart's activity for a few days.  A transthoracic echocardiogram (TTE) to create pictures of your heart.  A transesophageal echocardiogram (TEE) to create even closer pictures of your heart.  A stress test to check your blood supply while you exercise.  Imaging tests, such as a CT scan or chest X-ray.  Blood tests. How is this treated? Treatment depends on underlying conditions and how you feel when you experience atrial fibrillation. This condition may be treated with:  Medicines to prevent blood clots or to treat heart rate or heart rhythm problems.  Electrical cardioversion to reset the heart's rhythm.  A pacemaker to correct abnormal heart rhythm.  Ablation to remove the heart tissue that sends abnormal signals.  Left atrial appendage closure to seal the area where blood clots can form. In some cases, underlying conditions will be treated. Follow these instructions at home: Medicines  Take over-the counter and prescription medicines only as told by your health care provider.  Do not take any new medicines without talking to your health care provider.  If you are taking blood thinners: ? Talk with  your health care provider before you take any medicines that contain aspirin or NSAIDs, such as ibuprofen. These medicines increase your risk for dangerous bleeding. ? Take your medicine exactly as  told, at the same time every day. ? Avoid activities that could cause injury or bruising, and follow instructions about how to prevent falls. ? Wear a medical alert bracelet or carry a card that lists what medicines you take. Lifestyle      Do not use any products that contain nicotine or tobacco, such as cigarettes, e-cigarettes, and chewing tobacco. If you need help quitting, ask your health care provider.  Eat heart-healthy foods. Talk with a dietitian to make an eating plan that is right for you.  Exercise regularly as told by your health care provider.  Do not drink alcohol.  Lose weight if you are overweight.  Do not use drugs, including cannabis. General instructions  If you have obstructive sleep apnea, manage your condition as told by your health care provider.  Do not use diet pills unless your health care provider approves. Diet pills can make heart problems worse.  Keep all follow-up visits as told by your health care provider. This is important. Contact a health care provider if you:  Notice a change in the rate, rhythm, or strength of your heartbeat.  Are taking a blood thinner and you notice more bruising.  Tire more easily when you exercise or do heavy work.  Have a sudden change in weight. Get help right away if you have:   Chest pain, abdominal pain, sweating, or weakness.  Trouble breathing.  Side effects of blood thinners, such as blood in your vomit, stool, or urine, or bleeding that cannot stop.  Any symptoms of a stroke. "BE FAST" is an easy way to remember the main warning signs of a stroke: ? B - Balance. Signs are dizziness, sudden trouble walking, or loss of balance. ? E - Eyes. Signs are trouble seeing or a sudden change in vision. ? F - Face. Signs are sudden weakness or numbness of the face, or the face or eyelid drooping on one side. ? A - Arms. Signs are weakness or numbness in an arm. This happens suddenly and usually on one side of the  body. ? S - Speech. Signs are sudden trouble speaking, slurred speech, or trouble understanding what people say. ? T - Time. Time to call emergency services. Write down what time symptoms started.  Other signs of a stroke, such as: ? A sudden, severe headache with no known cause. ? Nausea or vomiting. ? Seizure. These symptoms may represent a serious problem that is an emergency. Do not wait to see if the symptoms will go away. Get medical help right away. Call your local emergency services (911 in the [place] Do not drive yourself to the hospital. Summary  Atrial fibrillation is a type of irregular or rapid heartbeat (arrhythmia).  Symptoms include a feeling that your heart is beating fast or irregularly.  You may be given medicines to prevent blood clots or to treat heart rate or heart rhythm problems.  Get help right away if you have signs or symptoms of a stroke.  Get help right away if you cannot catch your breath or have chest pain or pressure. This information is not intended to replace advice given to you by your health care provider. Make sure you discuss any questions you have with your health care provider. Document Revised: 02/16/2019 Document Reviewed: 02/16/2019 Elsevier Patient Education  2020 Elsevier Inc.       Nichole Berg M.D.

## 2020-06-26 NOTE — Telephone Encounter (Signed)
Make her an appt   Preferred in person    But could do video if needed

## 2020-06-27 NOTE — Telephone Encounter (Signed)
Had visit yesterday 

## 2020-06-29 NOTE — Telephone Encounter (Signed)
Glad  You are doing better .  If you are not driving and have access to emergent  care  If needed  Should be ok to travel

## 2020-07-16 NOTE — Progress Notes (Signed)
[name] [address] [place] [address] Phone: [phone] Subjective:   I Nichole Berg am serving as a Neurosurgeon for Dr. Antoine Primas.  This visit occurred during the SARS-CoV-2 public health emergency.  Safety protocols were in place, including screening questions prior to the visit, additional usage of staff PPE, and extensive cleaning of exam room while observing appropriate contact time as indicated for disinfecting solutions.   I'm seeing this patient by the request  of:  Panosh, [name], MD  CC: right shoulder pain   [id number]   06/12/2020 Patient given injection today.  Patient does have findings with Nichole Berg had a chronic rotator cuff tear with mild fat atrophy.  Patient has minimal arthritic changes of the glenohumeral joint but does have some calcific changes of the posterior labrum.  Injection today and hopefully this will be beneficial home exercise, x-rays pending, topical anti-inflammatories, gabapentin secondary to the differential and the cervical radiculopathy follow-up again 6 weeks   Update 07/16/2020 Nichole Berg is a 74 y.o. female coming in with complaint of right shoulder pain. Was significant better after injection sent message 10/13 after injection on 10/5. States she is starting to feel pain again but not as bad as before. On the 17th of October she was diagnosed with A Fib.  Patient states that overall she is doing relatively well now on metoprolol.  Patient still states that the shoulder is significantly better than what it was prior to the injections.  Doing the exercises occasionally.  Xray 06/12/2020 IMPRESSION: No acute findings. Mild arthropathy of the glenohumeral and acromioclavicular joints.       Past Medical History:  Diagnosis Date  . Cataract 10/18/2011   Followed by opthalmology.   . COLONIC POLYPS, HX OF 10/12/2009  . COVID-19 03/2020  . HYPERLIPIDEMIA 05/31/2007  . NEPHROLITHIASIS, HX OF 05/31/2007    Past Surgical History:  Procedure Laterality Date  . CATARACT EXTRACTION, BILATERAL     Social History   Socioeconomic History  . Marital status: Single    Spouse name: Not on file  . Number of children: Not on file  . Years of education: Not on file  . Highest education level: Not on file  Occupational History  . Occupation: Retired  Tobacco Use  . Smoking status: Former Smoker    Years: 2.00    Types: Cigarettes    Quit date: 09/08/1964    Years since quitting: 55.8  . Smokeless tobacco: Never Used  Vaping Use  . Vaping Use: Never used  Substance and Sexual Activity  . Alcohol use: Yes    Comment: occassional  . Drug use: No  . Sexual activity: Not on file  Other Topics Concern  . 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 Difficulty of Paying Living Expenses: Not hard at all  Food Insecurity: No Food Insecurity  . Worried About Programme researcher, broadcasting/film/video in the Last Year: Never true  . Ran Out of Food in the Last Year: Never true  Transportation Needs: No Transportation Needs  . Lack of Transportation (Medical): No  . Lack of Transportation (Non-Medical): No  Physical Activity: Sufficiently Active  . Days of Exercise per Week: 7 days  .  Minutes of Exercise per Session: 90 min  Stress: No Stress Concern Present  . Feeling of Stress : Only a little  Social Connections: Moderately Isolated  . Frequency of Communication with Friends and Family: More than three times a week  . Frequency of Social Gatherings with Friends and Family: Once a week  . Attends Religious Services: Never  . Active Member of Clubs or Organizations: Yes  . Attends Banker Meetings: 1 to 4 times per year  . Marital Status: Never married   Allergies  Allergen Reactions  . Sulfonamide Derivatives     Family History  Problem Relation Age of Onset  . Atrial fibrillation Mother        Has pacer doing very well age 20 passed suddnely age 53 poss CV     Current Outpatient Medications (Cardiovascular):  .  metoprolol tartrate (LOPRESSOR) 25 MG tablet, Take 1 tablet (25 mg total) by mouth 2 (two) times daily. Then decrease to 12.5 twice a day to keep heart rate under 120   Current Outpatient Medications (Analgesics):  .  naproxen sodium (ALEVE) 220 MG tablet, Take 220 mg by mouth daily as needed (finger pain).   Current Outpatient Medications (Other):  Marland Kitchen  Artificial Tear Ointment (DRY EYES OP), Apply 1 drop to eye daily as needed (dry eyes). .  Cholecalciferol (VITAMIN D) 2000 UNITS tablet, Take 2,000 Units by mouth daily.   .  diclofenac Sodium (VOLTAREN) 1 % GEL, Apply topically 4 (four) times daily. Marland Kitchen  gabapentin (NEURONTIN) 100 MG capsule, Take 1 capsule (100 mg total) by mouth at bedtime. .  melatonin 5 MG TABS, Take 5 mg by mouth. .  Misc Natural Products (TART CHERRY ADVANCED PO), Take 800 mg by mouth daily. .  Multiple Vitamins-Minerals (ICAPS) CAPS, Take by mouth.   Reviewed prior external information including notes and imaging from  primary care provider As well as notes that were available from care everywhere and other healthcare systems.  Past medical history, social, surgical and family history all reviewed in electronic medical record.  No pertanent information unless stated regarding to the chief complaint.   Review of Systems:  No headache, visual changes, nausea, vomiting, diarrhea, constipation, dizziness, abdominal pain, skin rash, fevers, chills, night sweats, weight loss, swollen lymph nodes, body aches, joint swelling, chest pain, shortness of breath, mood changes. POSITIVE muscle aches  Objective  Blood pressure 102/70, pulse 73, height 5\' 4"  (1.626 m), weight 141 lb (64 kg), SpO2 96 %.   General: No apparent distress alert and oriented x3 mood and  affect normal, dressed appropriately.  HEENT: Pupils equal, extraocular movements intact  Respiratory: Patient's speak in full sentences and does not appear short of breath  Cardiovascular: No lower extremity edema, non tender, no erythema  Neuro: Cranial nerves II through XII are intact, neurovascularly intact in all extremities with 2+ DTRs and 2+ pulses.  Gait normal with good balance and coordination.  MSK:  Right shoulder exam shows the patient does have a positive O'Brien still noted.  Pain with empty can but no significant weakness.  Mild pain over the acromioclavicular joint.  Very mild impingement with Hawkins    Impression and Recommendations:     The above documentation has been reviewed and is accurate and complete , DO

## 2020-07-17 ENCOUNTER — Ambulatory Visit: Payer: Medicare PPO | Admitting: Family Medicine

## 2020-07-17 ENCOUNTER — Ambulatory Visit: Payer: Self-pay

## 2020-07-17 ENCOUNTER — Encounter: Payer: Self-pay | Admitting: Family Medicine

## 2020-07-17 ENCOUNTER — Other Ambulatory Visit: Payer: Self-pay

## 2020-07-17 VITALS — BP 102/70 | HR 73 | Ht 64.0 in | Wt 141.0 lb

## 2020-07-17 DIAGNOSIS — G8929 Other chronic pain: Secondary | ICD-10-CM

## 2020-07-17 DIAGNOSIS — M25511 Pain in right shoulder: Secondary | ICD-10-CM

## 2020-07-17 NOTE — Assessment & Plan Note (Signed)
Patient responded very well to the injection.  Now though unfortunately has new onset atrial fibrillation.  Is on Metroprolol.  Patient has been doing the exercises.  Patient wants to hold on any physical therapy secondary to the work-up for the atrial fibrillation.  Patient will follow up again in 6 to 10 weeks for further evaluation and treatment.  Worsening pain will consider formal physical therapy, repeat injections or advanced imaging

## 2020-07-17 NOTE — Patient Instructions (Signed)
Keep up with the exercises Pennsaid small amount 2 times a day Keep hands within peripheral vision See me again in 2 months

## 2020-07-19 ENCOUNTER — Other Ambulatory Visit: Payer: Self-pay

## 2020-07-19 ENCOUNTER — Ambulatory Visit: Payer: Medicare PPO | Admitting: Cardiology

## 2020-07-19 ENCOUNTER — Encounter: Payer: Self-pay | Admitting: Cardiology

## 2020-07-19 VITALS — BP 122/80 | HR 62 | Ht 64.0 in | Wt 142.0 lb

## 2020-07-19 DIAGNOSIS — I4819 Other persistent atrial fibrillation: Secondary | ICD-10-CM | POA: Diagnosis not present

## 2020-07-19 DIAGNOSIS — I10 Essential (primary) hypertension: Secondary | ICD-10-CM

## 2020-07-19 DIAGNOSIS — Z01812 Encounter for preprocedural laboratory examination: Secondary | ICD-10-CM | POA: Diagnosis not present

## 2020-07-19 DIAGNOSIS — Z79899 Other long term (current) drug therapy: Secondary | ICD-10-CM

## 2020-07-19 MED ORDER — METOPROLOL TARTRATE 25 MG PO TABS: 25.0000 mg | ORAL_TABLET | ORAL | 3 refills | Status: DC | PRN

## 2020-07-19 MED ORDER — APIXABAN 5 MG PO TABS: 5.0000 mg | ORAL_TABLET | Freq: Two times a day (BID) | ORAL | 6 refills | Status: DC

## 2020-07-19 MED ORDER — APIXABAN 5 MG PO TABS: 5.0000 mg | ORAL_TABLET | Freq: Two times a day (BID) | ORAL | 1 refills | Status: DC

## 2020-07-19 MED ORDER — METOPROLOL SUCCINATE ER 50 MG PO TB24: 50.0000 mg | ORAL_TABLET | Freq: Every day | ORAL | 3 refills | Status: DC

## 2020-07-19 NOTE — Patient Instructions (Signed)
Medication Instructions:  Please start Eliquis 5 mh twice a day. Start Metoprolol Succinate 50 mg once a day. You may take Metoprolol Tartrate as needed for increased heart rate.  *If you need a refill on your cardiac medications before your next appointment, please call your pharmacy*  Lab Work: Please have blood work today (FreeT4, TSH)  You will need to have blood work [facility]) prior your cardioversion (here) as well as Covid screening.  Please see map provided for location.  If you have labs (blood work) drawn today and your tests are completely normal, you will receive your results only by: Marland Kitchen MyChart Message (if you have MyChart) OR . A paper copy in the mail If you have any lab test that is abnormal or we need to change your treatment, we will call you to review the results.  Testing/Procedures: Your physician has requested that you have an echocardiogram. Echocardiography is a painless test that uses sound waves to create images of your heart. It provides your doctor with information about the size and shape of your heart and how well your heart's chambers and valves are working. This procedure takes approximately one hour. There are no restrictions for this procedure.  Your physician has requested that you have a Cardioversion.  Electrical Cardioversion uses a jolt of electricity to your heart either through paddles or wired patches attached to your chest. This is a controlled, usually prescheduled, procedure. This procedure is done at the hospital and you are not awake during the procedure. You usually go home the day of the procedure.   Follow-Up: At [facility], you and your health needs are our priority.  As part of our continuing mission to provide you with exceptional heart care, we have created designated Provider Care Teams.  These Care Teams include your primary Cardiologist (physician) and Advanced Practice Providers (APPs -  Physician Assistants and Nurse Practitioners)  who all work together to provide you with the care you need, when you need it.  We recommend signing up for the patient portal called "MyChart".  Sign up information is provided on this After Visit Summary.  MyChart is used to connect with patients for Virtual Visits (Telemedicine).  Patients are able to view lab/test results, encounter notes, upcoming appointments, etc.  Non-urgent messages can be sent to your provider as well.   To learn more about what you can do with MyChart, go to [email or web address].    Your next appointment:   4 week(s)  The format for your next appointment:   In Person  Provider:   Donato Schultz, MD   Thank you for choosing [facility]!!

## 2020-07-19 NOTE — H&P (View-Only) (Signed)
°Cardiology Office Note:   ° °Date:  07/19/2020  ° °ID:  [name], DOB 03/06/1946, MRN [id number] ° °PCP:  Panosh, Wanda K, MD  °[facility] Cardiologist:  Katielynn Horan, MD  °[facility] Electrophysiologist:  None  ° °Referring MD: Panosh, Wanda K, MD  ° ° ° °History of Present Illness:   ° °Nichole Berg is a 74 y.o. 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 Wanda Panosh. ° °Had break through COVID in 03/2020 (was previously vaccinated) °In review of urgent care note as well as prior office note with Dr. Panosh she was newly diagnosed with atrial fibrillation on 06/25/2020 at a urgent care visit after she felt palpitations and dizziness off and on over about 4 days.  Her blood pressure machine registered a pulse over 160.  Has felt some tiredness but no chest pain no syncope no bleeding.  Rare alcohol use. Used to have palps from coffee in her 30's.  ° °Family history includes mother who had atrial fibrillation in her 90s, son who recently was diagnosed with atrial fibrillation as well. Father CHF.  ° °Metoprolol 25 mg was started.  She was given aspirin.  Troponin was negative atrial fibrillation is not usually associated with acute coronary syndrome. ° °Taking metoprolol q8 hours often to help. Improved symptoms with metoprolol. Rested. Active with dog, walks. No major SOB, CP.  No prior bleeding. ° ° °Past Medical History:  °Diagnosis Date  °• Cataract 10/18/2011  ° Followed by opthalmology.   °• COLONIC POLYPS, HX OF 10/12/2009  °• COVID-19 03/2020  °• HYPERLIPIDEMIA 05/31/2007  °• NEPHROLITHIASIS, HX OF 05/31/2007  ° ° °Past Surgical History:  °Procedure Laterality Date  °• CATARACT EXTRACTION, BILATERAL    ° ° °Current Medications: °Current Meds  °Medication Sig  °• Artificial Tear Ointment (DRY EYES OP) Apply 1 drop to eye daily as needed (dry eyes).  °• Cholecalciferol (VITAMIN D) 2000 UNITS tablet Take 2,000 Units by mouth  daily.    °• diclofenac Sodium (VOLTAREN) 1 % GEL Apply topically 4 (four) times daily.  °• gabapentin (NEURONTIN) 100 MG capsule Take 1 capsule (100 mg total) by mouth at bedtime.  °• melatonin 5 MG TABS Take 5 mg by mouth.  °• metoprolol tartrate (LOPRESSOR) 25 MG tablet Take 1 tablet (25 mg total) by mouth as needed. Then decrease to 12.5 twice a day to keep heart rate under 120  °• Misc Natural Products (TART CHERRY ADVANCED PO) Take 800 mg by mouth daily.  °• Multiple Vitamins-Minerals (ICAPS) CAPS Take by mouth.  °• naproxen sodium (ALEVE) 220 MG tablet Take 220 mg by mouth daily as needed (finger pain).  °• [DISCONTINUED] metoprolol tartrate (LOPRESSOR) 25 MG tablet Take 1 tablet (25 mg total) by mouth 2 (two) times daily. Then decrease to 12.5 twice a day to keep heart rate under 120  °  ° °Allergies:   Sulfonamide derivatives  ° °Social History  ° °Socioeconomic History  °• Marital status: Single  °  Spouse name: Not on file  °• Number of children: Not on file  °• Years of education: Not on file  °• Highest education level: Not on file  °Occupational History  °• Occupation: Retired  °Tobacco Use  °• Smoking status: Former Smoker  °  Years: 2.00  °  Types: Cigarettes  °  Quit date: 09/08/1964  °  Years since quitting: 55.8  °• Smokeless tobacco: Never Used  °  Vaping Use  °• Vaping Use: Never used  °Substance and Sexual Activity  °• Alcohol use: Yes  °  Comment: occassional  °• Drug use: No  °• Sexual activity: Not on file  °Other Topics Concern  °• Not on file  °Social History Narrative  ° Retired nurse practitioner   ° Non smoker  ° Works as nurse on weekend clinic part time   ° H hof 2 1 cat neg tob  °   ° °Social Determinants of Health  ° °Financial Resource Strain: Low Risk   °• Difficulty of Paying Living Expenses: Not hard at all  °Food Insecurity: No Food Insecurity  °• Worried About Running Out of Food in the Last Year: Never true  °• Ran Out of Food in the Last Year: Never true  °Transportation Needs:  No Transportation Needs  °• Lack of Transportation (Medical): No  °• Lack of Transportation (Non-Medical): No  °Physical Activity: Sufficiently Active  °• Days of Exercise per Week: 7 days  °• Minutes of Exercise per Session: 90 min  °Stress: No Stress Concern Present  °• Feeling of Stress : Only a little  °Social Connections: Moderately Isolated  °• Frequency of Communication with Friends and Family: More than three times a week  °• Frequency of Social Gatherings with Friends and Family: Once a week  °• Attends Religious Services: Never  °• Active Member of Clubs or Organizations: Yes  °• Attends Club or Organization Meetings: 1 to 4 times per year  °• Marital Status: Never married  °  ° °Family History: °The patient's family history includes Atrial fibrillation in her mother. ° °ROS:   °Please see the history of present illness.    °No fevers chills nausea vomiting syncope bleeding all other systems reviewed and are negative. ° °EKGs/Labs/Other Studies Reviewed:   ° ° °EKG:  EKG from 06/25/2020 shows atrial fibrillation heart rate 103 with nonspecific ST-T wave changes, PVC. ° °Recent Labs: °06/25/2020: BUN 13; Creatinine, Ser 0.69; Hemoglobin 13.6; Platelets 302; Potassium 4.0; Sodium 138  °Recent Lipid Panel °   °Component Value Date/Time  ° CHOL 256 (H) 10/17/2019 1058  ° TRIG 86.0 10/17/2019 1058  ° HDL 71.20 10/17/2019 1058  ° CHOLHDL 4 10/17/2019 1058  ° VLDL 17.2 10/17/2019 1058  ° LDLCALC 167 (H) 10/17/2019 1058  ° LDLDIRECT 160.3 10/13/2011 1058  ° ° ° °Risk Assessment/Calculations:   °  °CHA2DS2-VASc Score = 4  °This indicates a 4.8% annual risk of stroke. °The patient's score is based upon: °CHF History: 0 °HTN History: 1 °Diabetes History: 0 °Stroke History: 0 °Vascular Disease History: 0 °Age Score: 2 °Gender Score: 1 °  ° ° ° °Physical Exam:   ° °VS:  BP 122/80    Pulse 62    Ht 5' 4" (1.626 m)    Wt 142 lb (64.4 kg)    SpO2 97%    BMI 24.37 kg/m²    ° °Wt Readings from Last 3 Encounters:  °07/19/20  142 lb (64.4 kg)  °07/17/20 141 lb (64 kg)  °06/26/20 141 lb 4 oz (64.1 kg)  °  ° °GEN:  Well nourished, well developed in no acute distress °HEENT: Normal °NECK: No JVD; No carotid bruits °LYMPHATICS: No lymphadenopathy °CARDIAC: irreg, no murmurs, rubs, gallops °RESPIRATORY:  Clear to auscultation without rales, wheezing or rhonchi  °ABDOMEN: Soft, non-tender, non-distended °MUSCULOSKELETAL:  No edema; No deformity  °SKIN: Warm and dry °NEUROLOGIC:  Alert and oriented x 3 °PSYCHIATRIC:  Normal affect  ° °  ASSESSMENT:   ° °1. Persistent atrial fibrillation (HCC)   °2. Essential hypertension   °3. Medication management   °4. Pre-procedure lab exam   ° °PLAN:   ° °In order of problems listed above: ° °Persistent atrial fibrillation °-CHA2DS2-VASc score is 4 °-We will start Eliquis 5 mg twice a day.  Watch for any signs of bleeding.  Continue to monitor with CBC as well as basic metabolic profile.  Most recent hemoglobin 13.6, creatinine 0.69 from outside labs.  Potassium was 4.0. °-She is feeling some breakthrough with the short acting metoprolol.  Will change to Toprol-XL 50 mg once a day.  She can continue to have her metoprolol tartrate for breakthrough tachycardia.  As needed °-Check echocardiogram °-Check TSH and free T4 °-After 3 weeks of consistent Eliquis use, will proceed with cardioversion.  Risks and benefits discussed.  Willing to proceed. ° ° °Essential hypertension °-Continue with metoprolol as above.  Continue to monitor.  Well-controlled today ° °Hyperlipidemia °-LDL 167.  Has been resistant in the past to taking statin therapy.  After she is converted, we will likely set her up for a coronary calcium score.  She is willing.  This will help us with guidance for statin therapy. ° ° °Shared Decision Making/Informed Consent   ° ° ° °Medication Adjustments/Labs and Tests Ordered: °Current medicines are reviewed at length with the patient today.  Concerns regarding medicines are outlined above.  °Orders  Placed This Encounter  °Procedures  °• T4, free  °• TSH  °• CBC  °• Basic metabolic panel  °• ECHOCARDIOGRAM COMPLETE  ° °Meds ordered this encounter  °Medications  °• metoprolol tartrate (LOPRESSOR) 25 MG tablet  °  Sig: Take 1 tablet (25 mg total) by mouth as needed. Then decrease to 12.5 twice a day to keep heart rate under 120  °  Dispense:  30 tablet  °  Refill:  3  °• metoprolol succinate (TOPROL-XL) 50 MG 24 hr tablet  °  Sig: Take 1 tablet (50 mg total) by mouth daily. Take with or immediately following a meal.  °  Dispense:  90 tablet  °  Refill:  3  °• apixaban (ELIQUIS) 5 MG TABS tablet  °  Sig: Take 1 tablet (5 mg total) by mouth 2 (two) times daily.  °  Dispense:  180 tablet  °  Refill:  6  °• apixaban (ELIQUIS) 5 MG TABS tablet  °  Sig: Take 1 tablet (5 mg total) by mouth 2 (two) times daily.  °  Dispense:  60 tablet  °  Refill:  1  ° ° °Patient Instructions  °Medication Instructions:  °Please start Eliquis 5 mh twice a day. °Start Metoprolol Succinate 50 mg once a day. °You may take Metoprolol Tartrate as needed for increased heart rate. ° °*If you need a refill on your cardiac medications before your next appointment, please call your pharmacy* ° °Lab Work: °Please have blood work today (FreeT4, TSH) ° °You will need to have blood work (BMP,CBC) prior your cardioversion (here) as well as Covid screening.  Please see map provided for location. ° °If you have labs (blood work) drawn today and your tests are completely normal, you will receive your results only by: °• MyChart Message (if you have MyChart) OR °• A paper copy in the mail °If you have any lab test that is abnormal or we need to change your treatment, we will call you to review the results. ° °Testing/Procedures: °Your physician has requested   that you have an echocardiogram. Echocardiography is a painless test that uses sound waves to create images of your heart. It provides your doctor with information about the size and shape of your  heart and how well your heart’s chambers and valves are working. This procedure takes approximately one hour. There are no restrictions for this procedure. ° °Your physician has requested that you have a Cardioversion.  Electrical Cardioversion uses a jolt of electricity to your heart either through paddles or wired patches attached to your chest. This is a controlled, usually prescheduled, procedure. This procedure is done at the hospital and you are not awake during the procedure. You usually go home the day of the procedure.  ° °Follow-Up: °At [facility], you and your health needs are our priority.  As part of our continuing mission to provide you with exceptional heart care, we have created designated Provider Care Teams.  These Care Teams include your primary Cardiologist (physician) and Advanced Practice Providers (APPs -  Physician Assistants and Nurse Practitioners) who all work together to provide you with the care you need, when you need it. ° °We recommend signing up for the patient portal called "MyChart".  Sign up information is provided on this After Visit Summary.  MyChart is used to connect with patients for Virtual Visits (Telemedicine).  Patients are able to view lab/test results, encounter notes, upcoming appointments, etc.  Non-urgent messages can be sent to your provider as well.   °To learn more about what you can do with MyChart, go to [email or web address].   ° °Your next appointment:   °4 week(s) ° °The format for your next appointment:   °In Person ° °Provider:   °Chelli Yerkes, MD ° ° °Thank you for choosing [facility]!! ° ° ° ° ° °  ° °Signed, °Ellionna Buckbee, MD  °07/19/2020 10:44 AM    ° [facility] °

## 2020-07-19 NOTE — Progress Notes (Signed)
Cardiology Office Note:    Date:  07/19/2020   ID:  Nichole Berg, DOB [birth year], MRN [id number]  PCP:  Madelin HeadingsPanosh, Wanda K, MD  [facility] Cardiologist:  Donato SchultzMark Shloime Keilman, MD  [facility] Electrophysiologist:  None   Referring MD: Madelin HeadingsPanosh, Wanda K, MD     History of Present Illness:    Nichole BlitzCarol Berg is a 74 y.o. 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 Berniece AndreasWanda Panosh.  Had break through COVID in 03/2020 (was previously vaccinated) In review of urgent care note as well as prior office note with Dr. Fabian SharpPanosh she was newly diagnosed with atrial fibrillation on 06/25/2020 at a urgent care visit after she felt palpitations and dizziness off and on over about 4 days.  Her blood pressure machine registered a pulse over 160.  Has felt some tiredness but no chest pain no syncope no bleeding.  Rare alcohol use. Used to have palps from coffee in her 30's.   Family history includes mother who had atrial fibrillation in her 3790s, son who recently was diagnosed with atrial fibrillation as well. Father CHF.   Metoprolol 25 mg was started.  She was given aspirin.  Troponin was negative atrial fibrillation is not usually associated with acute coronary syndrome.  Taking metoprolol q8 hours often to help. Improved symptoms with metoprolol. Rested. Active with dog, walks. No major SOB, CP.  No prior bleeding.   Past Medical History:  Diagnosis Date   Cataract 10/18/2011   Followed by opthalmology.    COLONIC POLYPS, HX OF 10/12/2009   COVID-19 03/2020   HYPERLIPIDEMIA 05/31/2007   NEPHROLITHIASIS, HX OF 05/31/2007    Past Surgical History:  Procedure Laterality Date   CATARACT EXTRACTION, BILATERAL      Current Medications: Current Meds  Medication Sig   Artificial Tear Ointment (DRY EYES OP) Apply 1 drop to eye daily as needed (dry eyes).   Cholecalciferol (VITAMIN D) 2000 UNITS tablet Take 2,000 Units by mouth  daily.     diclofenac Sodium (VOLTAREN) 1 % GEL Apply topically 4 (four) times daily.   gabapentin (NEURONTIN) 100 MG capsule Take 1 capsule (100 mg total) by mouth at bedtime.   melatonin 5 MG TABS Take 5 mg by mouth.   metoprolol tartrate (LOPRESSOR) 25 MG tablet Take 1 tablet (25 mg total) by mouth as needed. Then decrease to 12.5 twice a day to keep heart rate under 120   Misc Natural Products (TART CHERRY ADVANCED PO) Take 800 mg by mouth daily.   Multiple Vitamins-Minerals (ICAPS) CAPS Take by mouth.   naproxen sodium (ALEVE) 220 MG tablet Take 220 mg by mouth daily as needed (finger pain).   [DISCONTINUED] metoprolol tartrate (LOPRESSOR) 25 MG tablet Take 1 tablet (25 mg total) by mouth 2 (two) times daily. Then decrease to 12.5 twice a day to keep heart rate under 120     Allergies:   Sulfonamide derivatives   Social History   Socioeconomic History   Marital status: Single    Spouse name: Not on file   Number of children: Not on file   Years of education: Not on file   Highest education level: Not on file  Occupational History   Occupation: Retired  Tobacco Use   Smoking status: Former Smoker    Years: 2.00    Types: Cigarettes    Quit date: 09/08/1964    Years since quitting: 55.8   Smokeless tobacco: Never Used  Vaping Use   Vaping Use: Never used  Substance and Sexual Activity   Alcohol use: Yes    Comment: occassional   Drug use: No   Sexual activity: Not on file  Other Topics Concern   Not on file  Social History Narrative   Retired Publishing rights manager    Non smoker   Works as Engineer, 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    [employer] hof 2 1 cat neg tob      Social Determinants of Corporate investment banker Strain: Low Risk    Difficulty of Paying Living Expenses: Not hard at all  Food Insecurity: No Food Insecurity   Worried About Programme researcher, broadcasting/film/video in the Last Year: Never true   Barista in the Last Year: Never true  Transportation Needs:  No Transportation Needs   Lack of Transportation (Medical): No   Lack of Transportation (Non-Medical): No  Physical Activity: Sufficiently Active   Days of Exercise per Week: 7 days   Minutes of Exercise per Session: 90 min  Stress: No Stress Concern Present   Feeling of Stress : Only a little  Social Connections: Moderately Isolated   Frequency of Communication with Friends and Family: More than three times a week   Frequency of Social Gatherings with Friends and Family: Once a week   Attends Religious Services: Never   Database administrator or Organizations: Yes   Attends Banker Meetings: 1 to 4 times per year   Marital Status: Never married     Family History: The patient's family history includes Atrial fibrillation in her mother.  ROS:   Please see the history of present illness.    No fevers chills nausea vomiting syncope bleeding all other systems reviewed and are negative.  EKGs/Labs/Other Studies Reviewed:     EKG:  EKG from 06/25/2020 shows atrial fibrillation heart rate 103 with nonspecific ST-T wave changes, PVC.  Recent Labs: 06/25/2020: BUN 13; Creatinine, Ser 0.69; Hemoglobin 13.6; Platelets 302; Potassium 4.0; Sodium 138  Recent Lipid Panel    Component Value Date/Time   CHOL 256 (H) 10/17/2019 1058   TRIG 86.0 10/17/2019 1058   HDL 71.20 10/17/2019 1058   CHOLHDL 4 10/17/2019 1058   VLDL 17.2 10/17/2019 1058   LDLCALC 167 (H) 10/17/2019 1058   LDLDIRECT 160.3 10/13/2011 1058     Risk Assessment/Calculations:     CHA2DS2-VASc Score = 4  This indicates a 4.8% annual risk of stroke. The patient's score is based upon: CHF History: 0 HTN History: 1 Diabetes History: 0 Stroke History: 0 Vascular Disease History: 0 Age Score: 2 Gender Score: 1      Physical Exam:    VS:  BP 122/80    Pulse 62    Ht 5\' 4"  (1.626 m)    Wt 142 lb (64.4 kg)    SpO2 97%    BMI 24.37 kg/m     Wt Readings from Last 3 Encounters:  07/19/20  142 lb (64.4 kg)  07/17/20 141 lb (64 kg)  06/26/20 141 lb 4 oz (64.1 kg)     GEN:  Well nourished, well developed in no acute distress HEENT: Normal NECK: No JVD; No carotid bruits LYMPHATICS: No lymphadenopathy CARDIAC: irreg, no murmurs, rubs, gallops RESPIRATORY:  Clear to auscultation without rales, wheezing or rhonchi  ABDOMEN: Soft, non-tender, non-distended MUSCULOSKELETAL:  No edema; No deformity  SKIN: Warm and dry NEUROLOGIC:  Alert and oriented x 3 PSYCHIATRIC:  Normal affect  ASSESSMENT:    1. Persistent atrial fibrillation (HCC)   2. Essential hypertension   3. Medication management   4. Pre-procedure lab exam    PLAN:    In order of problems listed above:  Persistent atrial fibrillation -CHA2DS2-VASc score is 4 -We will start Eliquis 5 mg twice a day.  Watch for any signs of bleeding.  Continue to monitor with CBC as well as basic metabolic profile.  Most recent hemoglobin 13.6, creatinine 0.69 from outside labs.  Potassium was 4.0. -She is feeling some breakthrough with the short acting metoprolol.  Will change to Toprol-XL 50 mg once a day.  She can continue to have her metoprolol tartrate for breakthrough tachycardia.  As needed -Check echocardiogram -Check TSH and free T4 -After 3 weeks of consistent Eliquis use, will proceed with cardioversion.  Risks and benefits discussed.  Willing to proceed.   Essential hypertension -Continue with metoprolol as above.  Continue to monitor.  Well-controlled today  Hyperlipidemia -LDL 167.  Has been resistant in the past to taking statin therapy.  After she is converted, we will likely set her up for a coronary calcium score.  She is willing.  This will help [place] with guidance for statin therapy.   Shared Decision Making/Informed Consent      Medication Adjustments/Labs and Tests Ordered: Current medicines are reviewed at length with the patient today.  Concerns regarding medicines are outlined above.  Orders  Placed This Encounter  Procedures   T4, free   TSH   CBC   Basic metabolic panel   ECHOCARDIOGRAM COMPLETE   Meds ordered this encounter  Medications   metoprolol tartrate (LOPRESSOR) 25 MG tablet    Sig: Take 1 tablet (25 mg total) by mouth as needed. Then decrease to 12.5 twice a day to keep heart rate under 120    Dispense:  30 tablet    Refill:  3   metoprolol succinate (TOPROL-XL) 50 MG 24 hr tablet    Sig: Take 1 tablet (50 mg total) by mouth daily. Take with or immediately following a meal.    Dispense:  90 tablet    Refill:  3   apixaban (ELIQUIS) 5 MG TABS tablet    Sig: Take 1 tablet (5 mg total) by mouth 2 (two) times daily.    Dispense:  180 tablet    Refill:  6   apixaban (ELIQUIS) 5 MG TABS tablet    Sig: Take 1 tablet (5 mg total) by mouth 2 (two) times daily.    Dispense:  60 tablet    Refill:  1    Patient Instructions  Medication Instructions:  Please start Eliquis 5 mh twice a day. Start Metoprolol Succinate 50 mg once a day. You may take Metoprolol Tartrate as needed for increased heart rate.  *If you need a refill on your cardiac medications before your next appointment, please call your pharmacy*  Lab Work: Please have blood work today (FreeT4, TSH)  You will need to have blood work [facility]) prior your cardioversion (here) as well as Covid screening.  Please see map provided for location.  If you have labs (blood work) drawn today and your tests are completely normal, you will receive your results only by:  MyChart Message (if you have MyChart) OR  A paper copy in the mail If you have any lab test that is abnormal or we need to change your treatment, we will call you to review the results.  Testing/Procedures: Your physician has requested  that you have an echocardiogram. Echocardiography is a painless test that uses sound waves to create images of your heart. It provides your doctor with information about the size and shape of your  heart and how well your hearts chambers and valves are working. This procedure takes approximately one hour. There are no restrictions for this procedure.  Your physician has requested that you have a Cardioversion.  Electrical Cardioversion uses a jolt of electricity to your heart either through paddles or wired patches attached to your chest. This is a controlled, usually prescheduled, procedure. This procedure is done at the hospital and you are not awake during the procedure. You usually go home the day of the procedure.   Follow-Up: At [facility], you and your health needs are our priority.  As part of our continuing mission to provide you with exceptional heart care, we have created designated Provider Care Teams.  These Care Teams include your primary Cardiologist (physician) and Advanced Practice Providers (APPs -  Physician Assistants and Nurse Practitioners) who all work together to provide you with the care you need, when you need it.  We recommend signing up for the patient portal called "MyChart".  Sign up information is provided on this After Visit Summary.  MyChart is used to connect with patients for Virtual Visits (Telemedicine).  Patients are able to view lab/test results, encounter notes, upcoming appointments, etc.  Non-urgent messages can be sent to your provider as well.   To learn more about what you can do with MyChart, go to [email or web address].    Your next appointment:   4 week(s)  The format for your next appointment:   In Person  Provider:   Donato Schultz, MD   Thank you for choosing [facility]!!         Signed, Donato Schultz, MD  07/19/2020 10:44 AM    [facility]

## 2020-07-20 LAB — T4, FREE: Free T4: 1.3 ng/dL (ref 0.82–1.77)

## 2020-07-20 LAB — TSH: TSH: 1.41 u[IU]/mL (ref 0.450–4.500)

## 2020-08-06 ENCOUNTER — Other Ambulatory Visit: Payer: Self-pay | Admitting: *Deleted

## 2020-08-06 DIAGNOSIS — I4819 Other persistent atrial fibrillation: Secondary | ICD-10-CM

## 2020-08-06 DIAGNOSIS — Z01812 Encounter for preprocedural laboratory examination: Secondary | ICD-10-CM

## 2020-08-06 DIAGNOSIS — M5411 Radiculopathy, occipito-atlanto-axial region: Secondary | ICD-10-CM | POA: Diagnosis not present

## 2020-08-06 DIAGNOSIS — M9901 Segmental and somatic dysfunction of cervical region: Secondary | ICD-10-CM | POA: Diagnosis not present

## 2020-08-08 NOTE — Progress Notes (Signed)
Chief Complaint  Patient presents with  . Annual Exam    HPI: Nichole Berg 74 y.o. comes in today for Preventive Medicare exam/ wellness visit .Since last visit.  She has had a continued atrial fib with controlled rate feels fine doing her normal activity.  She is to have a cardioversion next week.  She has been on Eliquis in the meantime and had an echo today. She is holding off on the booster as she has had the primary immunization and breakthrough Covid in July. Healthcare parameters are up-to-date except perhaps 10 tetanus.   Health Maintenance  Topic Date Due  . TETANUS/TDAP  10/13/2019  . INFLUENZA VACCINE  Completed  . DEXA SCAN  Completed  . COVID-19 Vaccine  Completed  . Hepatitis C Screening  Completed  . PNA vac Low Risk Adult  Completed   Health Maintenance Review LIFESTYLE:  Exercise: Regular Tobacco/ETS: No Alcohol: Has stopped with her A. fib Sugar beverages: No Sleep: Okay Drug use: no HH: Household of 2 cat      Hearing: Okay  Vision:  No limitations at present . Last eye check UTD  Safety:  Has smoke detector and wears seat belts.  . No excess sun exposure. Sees dentist regularly.  Memory: Felt to be good  , no concern from her or her family.  Depression: No anhedonia unusual crying or depressive symptoms  Nutrition: Eats well balanced diet; adequate calcium and vitamin D. No swallowing chewing problems.  Injury: no major injuries in the last six months.  Other healthcare providers:  Reviewed today .  Preventive parameters: up-to-date  Reviewed   ADLS:   There are no problems or need for assistance  driving, feeding, obtaining food, dressing, toileting and bathing, managing money using phone. She is independent.     ROS:  GEN/ HEENT: No fever, significant weight changes sweats headaches vision problems hearing changes, CV/ PULM; No chest pain shortness of breath cough, syncope,edema  change in exercise tolerance. GI /GU: No adominal  pain, vomiting, change in bowel habits. No blood in the stool. No significant GU symptoms. SKIN/HEME: ,no acute skin rashes suspicious lesions or bleeding. No lymphadenopathy, nodules, masses.  NEURO/ PSYCH:  No neurologic signs such as weakness numbness. No depression anxiety. IMM/ Allergy: No unusual infections.  Allergy .   REST of 12 system review negative except as per HPI   Past Medical History:  Diagnosis Date  . Cataract 10/18/2011   Followed by opthalmology.   . COLONIC POLYPS, HX OF 10/12/2009  . COVID-19 03/2020  . HYPERLIPIDEMIA 05/31/2007  . NEPHROLITHIASIS, HX OF 05/31/2007    Family History  Problem Relation Age of Onset  . Atrial fibrillation Mother        Has pacer doing very well age 24 passed suddnely age 11 poss CV    Social History   Socioeconomic History  . Marital status: Single    Spouse name: Not on file  . Number of children: Not on file  . Years of education: Not on file  . Highest education level: Not on file  Occupational History  . Occupation: Retired  Tobacco Use  . Smoking status: Former Smoker    Years: 2.00    Types: Cigarettes    Quit date: 09/08/1964    Years since quitting: 55.9  . Smokeless tobacco: Never Used  Vaping Use  . Vaping Use: Never used  Substance and Sexual Activity  . Alcohol use: Yes    Comment: occassional  . Drug  use: No  . Sexual activity: Not on file  Other Topics Concern  . 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 Difficulty of Paying Living Expenses: Not hard at all  Food Insecurity: No Food Insecurity  . Worried About Programme researcher, broadcasting/film/videounning Out of Food in the Last Year: Never true  . Ran Out of Food in the Last Year: Never true  Transportation Needs: No Transportation Needs  . Lack of Transportation (Medical): No  . Lack of  Transportation (Non-Medical): No  Physical Activity: Sufficiently Active  . Days of Exercise per Week: 7 days  . Minutes of Exercise per Session: 90 min  Stress: No Stress Concern Present  . Feeling of Stress : Only a little  Social Connections: Moderately Isolated  . Frequency of Communication with Friends and Family: More than three times a week  . Frequency of Social Gatherings with Friends and Family: Once a week  . Attends Religious Services: Never  . Active Member of Clubs or Organizations: Yes  . Attends BankerClub or Organization Meetings: 1 to 4 times per year  . Marital Status: Never married    Outpatient Encounter Medications as of 08/10/2020  Medication Sig  . apixaban (ELIQUIS) 5 MG TABS tablet Take 1 tablet (5 mg total) by mouth 2 (two) times daily.  . Artificial Tear Ointment (DRY EYES OP) Apply 1 drop to eye daily as needed (dry eyes).  . Cholecalciferol (VITAMIN D) 2000 UNITS tablet Take 2,000 Units by mouth daily.    . diclofenac Sodium (VOLTAREN) 1 % GEL Apply 1 application topically 4 (four) times daily as needed (pain.).   Marland Kitchen. gabapentin (NEURONTIN) 100 MG capsule Take 1 capsule (100 mg total) by mouth at bedtime. (Patient taking differently: Take 100 mg by mouth at bedtime as needed (sleep/pain.). )  . LUTEIN PO Take 1 tablet by mouth daily.  . melatonin 5 MG TABS Take 5 mg by mouth at bedtime as needed (sleep.).   Marland Kitchen. metoprolol succinate (TOPROL-XL) 50 MG 24 hr tablet Take 1 tablet (50 mg total) by mouth daily. Take with or immediately following a meal.  . Misc Natural Products (TART CHERRY ADVANCED PO) Take 800 mg by mouth daily.   No facility-administered encounter medications on file as of 08/10/2020.    EXAM:  BP 112/64   Pulse 76   Temp 98.4 F (36.9 C) (Oral)   Ht 5\' 4"  (1.626 m)   Wt 141 lb (64 kg)   SpO2 97%   BMI 24.20 kg/m   Body mass index is 24.2 kg/m.  Physical Exam: Vital signs reviewed AVW:[id number]:This is a well-developed well-nourished alert  cooperative   who appears stated age in no acute distress.  HEENT: normocephalic atraumatic , Eyes: PERRL EOM's full, conjunctiva clear, Nares: paten,t no deformity discharge or tenderness., Ears: no deformity EAC's clear TMs with normal landmarks. Mouth:masked  NECK: supple without masses, thyromegaly or bruits. CHEST/PULM:  Clear to auscultation and percussion breath sounds equal no wheeze , rales or rhonchi. No chest wall deformities or tenderness.  Breast no nodules or discharge CV: PMI is nondisplaced, S1 S2 no gallops, murmurs, rubs. Peripheral pulses are full without delay.No JVD .  Rate is irregularly irregular. ABDOMEN: Bowel sounds normal nontender  No guard or rebound, no hepato splenomegal no CVA tenderness.   Extremtities:  No clubbing cyanosis or edema, no acute joint swelling or redness no focal atrophy NEURO:  Oriented x3, cranial nerves 3-12 appear to be intact, no obvious focal weakness,gait within normal limits no abnormal reflexes or asymmetrical SKIN: No acute rashes normal turgor, color, no bruising or petechiae.  No bruising or bleeding PSYCH: Oriented, good eye contact, no obvious depression anxiety, cognition and judgment appear normal. LN: no cervical axillary inguinal adenopathy No noted deficits in memory, attention, and speech.   Lab Results  Component Value Date   WBC WILL FOLLOW 08/10/2020   HGB WILL FOLLOW 08/10/2020   HCT WILL FOLLOW 08/10/2020   PLT WILL FOLLOW 08/10/2020   GLUCOSE 88 08/10/2020   CHOL 256 (H) 10/17/2019   TRIG 86.0 10/17/2019   HDL 71.20 10/17/2019   LDLDIRECT 160.3 10/13/2011   LDLCALC 167 (H) 10/17/2019   ALT 20 07/28/2018   AST 30 07/28/2018   NA 140 08/10/2020   K 4.2 08/10/2020   CL 101 08/10/2020   CREATININE 0.79 08/10/2020   BUN 13 08/10/2020   CO2 25 08/10/2020   TSH 1.410 07/19/2020   HGBA1C 5.4 08/10/2020   HGBA1C 5.4 08/10/2020    ASSESSMENT AND PLAN:  Discussed the following assessment and plan:  Visit for  preventive health examination  Medication management  Hyperglycemia - Plan: POC HgB A1c  Atrial fibrillation with rapid ventricular response (HCC)  Chronic anticoagulation Reviewed health maintenance up-to-date except for tetanus will consider lipid med in the future but at this point treatment for her A. fib.  No untoward side effects of the anticoagulant. Hemoglobin A1c today is normal previous hyperglycemia was a random nonfasting. Patient Care Team: Charidy Cappelletti, [name], MD as PCP - General Jake Bathe, MD as PCP - Cardiology (Cardiology) Sharrell Ku, MD as Consulting Physician (Gastroenterology) Mckinley Jewel, MD as Consulting Physician (Ophthalmology)  Patient Instructions  Continue lifestyle intervention healthy eating and exercise .   If all ok then yearly  cpx  As planned     Health Maintenance, Female Adopting a healthy lifestyle and getting preventive care are important in promoting health and wellness. Ask your health care provider about:  The right schedule for you to have regular tests and exams.  Things you can do on your own to prevent diseases and keep yourself healthy. What should I know about diet, weight, and exercise? Eat a healthy diet   Eat a diet that includes plenty of vegetables, fruits, low-fat dairy products, and lean protein.  Do not eat a lot of foods that are high in solid fats, added sugars, or sodium. Maintain a healthy weight Body mass index (BMI) is used to identify weight problems. It estimates body fat based on height and weight. Your health care provider can help determine your BMI and help you achieve or maintain a healthy weight. Get regular exercise Get regular exercise. This is one of the most important things you can do for your health. Most adults should:  Exercise for at least 150 minutes each week. The exercise should increase your heart rate and make you sweat (moderate-intensity exercise).  Do strengthening exercises at  least twice a week. This is in addition to the moderate-intensity exercise.  Spend less time sitting. Even light physical activity can be beneficial. Watch cholesterol and blood lipids Have your blood tested for lipids and cholesterol at 76 years of age, then have this test every 5 years. Have  your cholesterol levels checked more often if:  Your lipid or cholesterol levels are high.  You are older than 76 years of age.  You are at high risk for heart disease. What should I know about cancer screening? Depending on your health history and family history, you may need to have cancer screening at various ages. This may include screening for:  Breast cancer.  Cervical cancer.  Colorectal cancer.  Skin cancer.  Lung cancer. What should I know about heart disease, diabetes, and high blood pressure? Blood pressure and heart disease  High blood pressure causes heart disease and increases the risk of stroke. This is more likely to develop in people who have high blood pressure readings, are of African descent, or are overweight.  Have your blood pressure checked: ? Every 3-5 years if you are 70-31 years of age. ? Every year if you are 33 years old or older. Diabetes Have regular diabetes screenings. This checks your fasting blood sugar level. Have the screening done:  Once every three years after age 31 if you are at a normal weight and have a low risk for diabetes.  More often and at a younger age if you are overweight or have a high risk for diabetes. What should I know about preventing infection? Hepatitis B If you have a higher risk for hepatitis B, you should be screened for this virus. Talk with your health care provider to find out if you are at risk for hepatitis B infection. Hepatitis C Testing is recommended for:  Everyone born from [birth year] through 1967.  Anyone with known risk factors for hepatitis C. Sexually transmitted infections (STIs)  Get screened for STIs,  including gonorrhea and chlamydia, if: ? You are sexually active and are younger than 76 years of age. ? You are older than 76 years of age and your health care provider tells you that you are at risk for this type of infection. ? Your sexual activity has changed since you were last screened, and you are at increased risk for chlamydia or gonorrhea. Ask your health care provider if you are at risk.  Ask your health care provider about whether you are at high risk for HIV. Your health care provider may recommend a prescription medicine to help prevent HIV infection. If you choose to take medicine to prevent HIV, you should first get tested for HIV. You should then be tested every 3 months for as long as you are taking the medicine. Pregnancy  If you are about to stop having your period (premenopausal) and you may become pregnant, seek counseling before you get pregnant.  Take 400 to 800 micrograms (mcg) of folic acid every day if you become pregnant.  Ask for birth control (contraception) if you want to prevent pregnancy. Osteoporosis and menopause Osteoporosis is a disease in which the bones lose minerals and strength with aging. This can result in bone fractures. If you are 29 years old or older, or if you are at risk for osteoporosis and fractures, ask your health care provider if you should:  Be screened for bone loss.  Take a calcium or vitamin D supplement to lower your risk of fractures.  Be given hormone replacement therapy (HRT) to treat symptoms of menopause. Follow these instructions at home: Lifestyle  Do not use any products that contain nicotine or tobacco, such as cigarettes, e-cigarettes, and chewing tobacco. If you need help quitting, ask your health care provider.  Do not use street drugs.  Do  not share needles.  Ask your health care provider for help if you need support or information about quitting drugs. Alcohol use  Do not drink alcohol if: ? Your health care  provider tells you not to drink. ? You are pregnant, may be pregnant, or are planning to become pregnant.  If you drink alcohol: ? Limit how much you use to 0-1 drink a day. ? Limit intake if you are breastfeeding.  Be aware of how much alcohol is in your drink. In the [place] one drink equals one 12 oz bottle of beer (355 mL), one 5 oz glass of wine (148 mL), or one 1 oz glass of hard liquor (44 mL). General instructions  Schedule regular health, dental, and eye exams.  Stay current with your vaccines.  Tell your health care provider if: ? You often feel depressed. ? You have ever been abused or do not feel safe at home. Summary  Adopting a healthy lifestyle and getting preventive care are important in promoting health and wellness.  Follow your health care provider's instructions about healthy diet, exercising, and getting tested or screened for diseases.  Follow your health care provider's instructions on monitoring your cholesterol and blood pressure. This information is not intended to replace advice given to you by your health care provider. Make sure you discuss any questions you have with your health care provider. Document Revised: 08/18/2018 Document Reviewed: 08/18/2018 Elsevier Patient Education  2020 [employer].    [place] K. Malajah Oceguera M.D.

## 2020-08-10 ENCOUNTER — Ambulatory Visit (INDEPENDENT_AMBULATORY_CARE_PROVIDER_SITE_OTHER): Payer: Medicare PPO | Admitting: Internal Medicine

## 2020-08-10 ENCOUNTER — Other Ambulatory Visit: Payer: Self-pay

## 2020-08-10 ENCOUNTER — Encounter: Payer: Self-pay | Admitting: Internal Medicine

## 2020-08-10 ENCOUNTER — Ambulatory Visit (INDEPENDENT_AMBULATORY_CARE_PROVIDER_SITE_OTHER): Payer: Medicare PPO | Admitting: *Deleted

## 2020-08-10 ENCOUNTER — Ambulatory Visit (HOSPITAL_COMMUNITY): Payer: Medicare PPO | Attending: Cardiovascular Disease

## 2020-08-10 ENCOUNTER — Other Ambulatory Visit: Payer: Medicare PPO | Admitting: *Deleted

## 2020-08-10 VITALS — BP 112/64 | HR 76 | Temp 98.4°F | Ht 64.0 in | Wt 141.0 lb

## 2020-08-10 VITALS — HR 80 | Ht 64.0 in | Wt 140.0 lb

## 2020-08-10 DIAGNOSIS — Z8616 Personal history of COVID-19: Secondary | ICD-10-CM | POA: Diagnosis not present

## 2020-08-10 DIAGNOSIS — R739 Hyperglycemia, unspecified: Secondary | ICD-10-CM

## 2020-08-10 DIAGNOSIS — Z7901 Long term (current) use of anticoagulants: Secondary | ICD-10-CM | POA: Diagnosis not present

## 2020-08-10 DIAGNOSIS — I4891 Unspecified atrial fibrillation: Secondary | ICD-10-CM

## 2020-08-10 DIAGNOSIS — Z0000 Encounter for general adult medical examination without abnormal findings: Secondary | ICD-10-CM

## 2020-08-10 DIAGNOSIS — I10 Essential (primary) hypertension: Secondary | ICD-10-CM

## 2020-08-10 DIAGNOSIS — Z79899 Other long term (current) drug therapy: Secondary | ICD-10-CM

## 2020-08-10 DIAGNOSIS — Z01812 Encounter for preprocedural laboratory examination: Secondary | ICD-10-CM

## 2020-08-10 DIAGNOSIS — I4819 Other persistent atrial fibrillation: Secondary | ICD-10-CM | POA: Insufficient documentation

## 2020-08-10 LAB — POCT GLYCOSYLATED HEMOGLOBIN (HGB A1C)
HbA1c POC (<> result, manual entry): 5.4 % (ref 4.0–5.6)
Hemoglobin A1C: 5.4 % (ref 4.0–5.6)

## 2020-08-10 LAB — BASIC METABOLIC PANEL
BUN/Creatinine Ratio: 16 (ref 12–28)
BUN: 13 mg/dL (ref 8–27)
CO2: 25 mmol/L (ref 20–29)
Calcium: 9.2 mg/dL (ref 8.7–10.3)
Chloride: 101 mmol/L (ref 96–106)
Creatinine, Ser: 0.79 mg/dL (ref 0.57–1.00)
GFR calc Af Amer: 84 mL/min/{1.73_m2} (ref >59)
GFR calc non Af Amer: 73 mL/min/{1.73_m2} (ref >59)
Glucose: 88 mg/dL (ref 65–99)
Potassium: 4.2 mmol/L (ref 3.5–5.2)
Sodium: 140 mmol/L (ref 134–144)

## 2020-08-10 LAB — ECHOCARDIOGRAM COMPLETE: S' Lateral: 3 cm

## 2020-08-10 LAB — CBC
Hematocrit: 35.8 % (ref 34.0–46.6)
Hemoglobin: 12.1 g/dL (ref 11.1–15.9)
MCH: 31.8 pg (ref 26.6–33.0)
MCHC: 33.8 g/dL (ref 31.5–35.7)
MCV: 94 fL (ref 79–97)
Platelets: 244 10*3/uL (ref 150–450)
RBC: 3.81 x10E6/uL (ref 3.77–5.28)
RDW: 11.6 % — ABNORMAL LOW (ref 11.7–15.4)
WBC: 4.8 10*3/uL (ref 3.4–10.8)

## 2020-08-10 NOTE — Patient Instructions (Signed)
Medication Instructions:  The current medical regimen is effective;  continue present plan and medications.  *If you need a refill on your cardiac medications before your next appointment, please call your pharmacy*  Your physician has requested that you have a Cardioversion.  This is a controlled, usually prescheduled, procedure. This procedure is done at the hospital and you are not awake during the procedure. You usually go home the day of the procedure. Please see the instruction sheet given to you today for more information.  Follow-Up: At [facility], you and your health needs are our priority.  As part of our continuing mission to provide you with exceptional heart care, we have created designated Provider Care Teams.  These Care Teams include your primary Cardiologist (physician) and Advanced Practice Providers (APPs -  Physician Assistants and Nurse Practitioners) who all work together to provide you with the care you need, when you need it.  We recommend signing up for the patient portal called "MyChart".  Sign up information is provided on this After Visit Summary.  MyChart is used to connect with patients for Virtual Visits (Telemedicine).  Patients are able to view lab/test results, encounter notes, upcoming appointments, etc.  Non-urgent messages can be sent to your provider as well.   To learn more about what you can do with MyChart, go to [email or web address].    Your next appointment:   As scheduled after cardioversion.  Thank you for choosing [facility]!!

## 2020-08-10 NOTE — Patient Instructions (Signed)
Continue lifestyle intervention healthy eating and exercise .   If all ok then yearly  cpx  As planned     Health Maintenance, Female Adopting a healthy lifestyle and getting preventive care are important in promoting health and wellness. Ask your health care provider about:  The right schedule for you to have regular tests and exams.  Things you can do on your own to prevent diseases and keep yourself healthy. What should I know about diet, weight, and exercise? Eat a healthy diet   Eat a diet that includes plenty of vegetables, fruits, low-fat dairy products, and lean protein.  Do not eat a lot of foods that are high in solid fats, added sugars, or sodium. Maintain a healthy weight Body mass index (BMI) is used to identify weight problems. It estimates body fat based on height and weight. Your health care provider can help determine your BMI and help you achieve or maintain a healthy weight. Get regular exercise Get regular exercise. This is one of the most important things you can do for your health. Most adults should:  Exercise for at least 150 minutes each week. The exercise should increase your heart rate and make you sweat (moderate-intensity exercise).  Do strengthening exercises at least twice a week. This is in addition to the moderate-intensity exercise.  Spend less time sitting. Even light physical activity can be beneficial. Watch cholesterol and blood lipids Have your blood tested for lipids and cholesterol at 76 years of age, then have this test every 5 years. Have your cholesterol levels checked more often if:  Your lipid or cholesterol levels are high.  You are older than 77 years of age.  You are at high risk for heart disease. What should I know about cancer screening? Depending on your health history and family history, you may need to have cancer screening at various ages. This may include screening for:  Breast cancer.  Cervical cancer.  Colorectal  cancer.  Skin cancer.  Lung cancer. What should I know about heart disease, diabetes, and high blood pressure? Blood pressure and heart disease  High blood pressure causes heart disease and increases the risk of stroke. This is more likely to develop in people who have high blood pressure readings, are of African descent, or are overweight.  Have your blood pressure checked: ? Every 3-5 years if you are 86-67 years of age. ? Every year if you are 62 years old or older. Diabetes Have regular diabetes screenings. This checks your fasting blood sugar level. Have the screening done:  Once every three years after age 72 if you are at a normal weight and have a low risk for diabetes.  More often and at a younger age if you are overweight or have a high risk for diabetes. What should I know about preventing infection? Hepatitis B If you have a higher risk for hepatitis B, you should be screened for this virus. Talk with your health care provider to find out if you are at risk for hepatitis B infection. Hepatitis C Testing is recommended for:  Everyone born from [birth year] through 1967.  Anyone with known risk factors for hepatitis C. Sexually transmitted infections (STIs)  Get screened for STIs, including gonorrhea and chlamydia, if: ? You are sexually active and are younger than 76 years of age. ? You are older than 76 years of age and your health care provider tells you that you are at risk for this type of infection. ? Your sexual  activity has changed since you were last screened, and you are at increased risk for chlamydia or gonorrhea. Ask your health care provider if you are at risk.  Ask your health care provider about whether you are at high risk for HIV. Your health care provider may recommend a prescription medicine to help prevent HIV infection. If you choose to take medicine to prevent HIV, you should first get tested for HIV. You should then be tested every 3 months for as long as  you are taking the medicine. Pregnancy  If you are about to stop having your period (premenopausal) and you may become pregnant, seek counseling before you get pregnant.  Take 400 to 800 micrograms (mcg) of folic acid every day if you become pregnant.  Ask for birth control (contraception) if you want to prevent pregnancy. Osteoporosis and menopause Osteoporosis is a disease in which the bones lose minerals and strength with aging. This can result in bone fractures. If you are 32 years old or older, or if you are at risk for osteoporosis and fractures, ask your health care provider if you should:  Be screened for bone loss.  Take a calcium or vitamin D supplement to lower your risk of fractures.  Be given hormone replacement therapy (HRT) to treat symptoms of menopause. Follow these instructions at home: Lifestyle  Do not use any products that contain nicotine or tobacco, such as cigarettes, e-cigarettes, and chewing tobacco. If you need help quitting, ask your health care provider.  Do not use street drugs.  Do not share needles.  Ask your health care provider for help if you need support or information about quitting drugs. Alcohol use  Do not drink alcohol if: ? Your health care provider tells you not to drink. ? You are pregnant, may be pregnant, or are planning to become pregnant.  If you drink alcohol: ? Limit how much you use to 0-1 drink a day. ? Limit intake if you are breastfeeding.  Be aware of how much alcohol is in your drink. In the [place] one drink equals one 12 oz bottle of beer (355 mL), one 5 oz glass of wine (148 mL), or one 1 oz glass of hard liquor (44 mL). General instructions  Schedule regular health, dental, and eye exams.  Stay current with your vaccines.  Tell your health care provider if: ? You often feel depressed. ? You have ever been abused or do not feel safe at home. Summary  Adopting a healthy lifestyle and getting preventive care are  important in promoting health and wellness.  Follow your health care provider's instructions about healthy diet, exercising, and getting tested or screened for diseases.  Follow your health care provider's instructions on monitoring your cholesterol and blood pressure. This information is not intended to replace advice given to you by your health care provider. Make sure you discuss any questions you have with your health care provider. Document Revised: 08/18/2018 Document Reviewed: 08/18/2018 Elsevier Patient Education  2020 [employer].

## 2020-08-10 NOTE — Progress Notes (Signed)
Here today for EKG prior to cardioversion scheduled for Wednesday 08/15/2020.  Pt had no questions today and is planning to have Covid screening on Monday as sscheduled.

## 2020-08-13 ENCOUNTER — Other Ambulatory Visit (HOSPITAL_COMMUNITY)
Admission: RE | Admit: 2020-08-13 | Discharge: 2020-08-13 | Disposition: A | Discharge status: Home / Self Care | Payer: Medicare PPO | Source: Ambulatory Visit | Attending: Cardiology | Admitting: Cardiology

## 2020-08-13 DIAGNOSIS — Z01812 Encounter for preprocedural laboratory examination: Secondary | ICD-10-CM | POA: Insufficient documentation

## 2020-08-13 DIAGNOSIS — Z20822 Contact with and (suspected) exposure to covid-19: Secondary | ICD-10-CM | POA: Insufficient documentation

## 2020-08-13 LAB — SARS CORONAVIRUS 2 (TAT 6-24 HRS): SARS Coronavirus 2: NEGATIVE

## 2020-08-14 NOTE — Addendum Note (Signed)
Addended by: Donato Schultz C on: 08/14/2020 01:33 PM   Modules accepted: Orders, SmartSet

## 2020-08-15 ENCOUNTER — Encounter (HOSPITAL_COMMUNITY): Payer: Self-pay | Admitting: Cardiology

## 2020-08-15 ENCOUNTER — Ambulatory Visit (HOSPITAL_COMMUNITY)
Admission: RE | Admit: 2020-08-15 | Discharge: 2020-08-15 | Disposition: A | Discharge status: Home / Self Care | Payer: Medicare PPO | Attending: Cardiology | Admitting: Cardiology

## 2020-08-15 ENCOUNTER — Encounter (HOSPITAL_COMMUNITY)
Admission: RE | Disposition: A | Discharge status: Home / Self Care | Payer: Self-pay | Source: Home / Self Care | Attending: Cardiology

## 2020-08-15 ENCOUNTER — Other Ambulatory Visit: Payer: Self-pay

## 2020-08-15 ENCOUNTER — Ambulatory Visit (HOSPITAL_COMMUNITY): Payer: Medicare PPO | Admitting: Certified Registered"

## 2020-08-15 DIAGNOSIS — Z7901 Long term (current) use of anticoagulants: Secondary | ICD-10-CM | POA: Insufficient documentation

## 2020-08-15 DIAGNOSIS — R739 Hyperglycemia, unspecified: Secondary | ICD-10-CM | POA: Diagnosis not present

## 2020-08-15 DIAGNOSIS — Z87891 Personal history of nicotine dependence: Secondary | ICD-10-CM | POA: Insufficient documentation

## 2020-08-15 DIAGNOSIS — I10 Essential (primary) hypertension: Secondary | ICD-10-CM | POA: Insufficient documentation

## 2020-08-15 DIAGNOSIS — Z79899 Other long term (current) drug therapy: Secondary | ICD-10-CM | POA: Diagnosis not present

## 2020-08-15 DIAGNOSIS — I480 Paroxysmal atrial fibrillation: Secondary | ICD-10-CM | POA: Diagnosis not present

## 2020-08-15 DIAGNOSIS — E785 Hyperlipidemia, unspecified: Secondary | ICD-10-CM | POA: Insufficient documentation

## 2020-08-15 DIAGNOSIS — I4891 Unspecified atrial fibrillation: Secondary | ICD-10-CM | POA: Diagnosis not present

## 2020-08-15 DIAGNOSIS — I4819 Other persistent atrial fibrillation: Secondary | ICD-10-CM | POA: Insufficient documentation

## 2020-08-15 HISTORY — PX: CARDIOVERSION: SHX1299

## 2020-08-15 SURGERY — CARDIOVERSION
Anesthesia: General

## 2020-08-15 MED ORDER — SODIUM CHLORIDE 0.9 % IV SOLN: INTRAVENOUS | Status: DC

## 2020-08-15 MED ORDER — LIDOCAINE 2% (20 MG/ML) 5 ML SYRINGE
INTRAMUSCULAR | Status: DC | PRN
  Administered 2020-08-15: 60 mg via INTRAVENOUS

## 2020-08-15 MED ORDER — PROPOFOL 10 MG/ML IV BOLUS
INTRAVENOUS | Status: DC | PRN
  Administered 2020-08-15: 75 mg via INTRAVENOUS

## 2020-08-15 NOTE — Procedures (Signed)
Procedure: Electrical Cardioversion Indications:  Atrial Fibrillation  Procedure Details:  Consent: Risks of procedure as well as the alternatives and risks of each were explained to the (patient/caregiver).  Consent for procedure obtained.  Time Out: Verified patient identification, verified procedure, site/side was marked, verified correct patient position, special equipment/implants available, medications/allergies/relevent history reviewed, required imaging and test results available. PERFORMED.  Patient placed on cardiac monitor, pulse oximetry, supplemental oxygen as necessary.  Sedation given: lidocaine 50mg ; propofol 70mg  Pacer pads placed anterior and posterior chest.  Cardioverted 1 time(s).  Cardioversion with synchronized biphasic 120J shock.  Evaluation: Findings: Post procedure EKG shows: NSR; frequent PACs Complications: None Patient did tolerate procedure well.  Time Spent Directly with the Patient:    08/15/2020, 11:12 AM

## 2020-08-15 NOTE — Procedures (Deleted)
Procedure: Electrical Cardioversion Indications:  Atrial Fibrillation  Procedure Details:  Consent: Risks of procedure as well as the alternatives and risks of each were explained to the (patient/caregiver).  Consent for procedure obtained.  Time Out: Verified patient identification, verified procedure, site/side was marked, verified correct patient position, special equipment/implants available, medications/allergies/relevent history reviewed, required imaging and test results available. PERFORMED.  Patient placed on cardiac monitor, pulse oximetry, supplemental oxygen as necessary.  Sedation given: Lidocaine 100mg ; propofol 100mg  Pacer pads placed anterior and posterior chest.  Cardioverted 3 time(s).  Cardioversion with synchronized biphasic 200J shock.  Evaluation: Findings: Post procedure EKG shows: Atrial Fibrillation Complications: None; remains in Afib Patient did tolerate procedure well.  Time Spent Directly with the Patient:    08/15/2020, 11:30 AM

## 2020-08-15 NOTE — Interval H&P Note (Signed)
History and Physical Interval Note:  08/15/2020 10:39 AM  Nichole Berg  has presented today for surgery, with the diagnosis of A-FIB.  The various methods of treatment have been discussed with the patient and family. After consideration of risks, benefits and other options for treatment, the patient has consented to  Procedure(s): CARDIOVERSION (N/A) as a surgical intervention.  The patient's history has been reviewed, patient examined, no change in status, stable for surgery.  I have reviewed the patient's chart and labs.  Questions were answered to the patient's satisfaction.     Meriam Sprague

## 2020-08-15 NOTE — Anesthesia Preprocedure Evaluation (Addendum)
Anesthesia Evaluation  Patient identified by MRN, date of birth, ID band Patient awake    Reviewed: Allergy & Precautions, NPO status , Patient's Chart, lab work & pertinent test results, reviewed documented beta blocker date and time   Airway Mallampati: II       Dental  (+) Teeth Intact, Dental Advisory Given   Pulmonary former smoker,    breath sounds clear to auscultation       Cardiovascular + dysrhythmias Atrial Fibrillation + Valvular Problems/Murmurs  Rhythm:Irregular Rate:Normal     Neuro/Psych    GI/Hepatic negative GI ROS, Neg liver ROS,   Endo/Other    Renal/GU negative Renal ROS     Musculoskeletal  (+) Arthritis ,   Abdominal   Peds  Hematology   Anesthesia Other Findings   Reproductive/Obstetrics                            Anesthesia Physical Anesthesia Plan  ASA: III  Anesthesia Plan: General   Post-op Pain Management:    Induction: Intravenous  PONV Risk Score and Plan:   Airway Management Planned: Mask  Additional Equipment: None  Intra-op Plan:   Post-operative Plan:   Informed Consent: I have reviewed the patients History and Physical, chart, labs and discussed the procedure including the risks, benefits and alternatives for the proposed anesthesia with the patient or authorized representative who has indicated his/her understanding and acceptance.     Dental advisory given  Plan Discussed with: CRNA, Anesthesiologist and Surgeon  Anesthesia Plan Comments:         Anesthesia Quick Evaluation

## 2020-08-15 NOTE — Transfer of Care (Signed)
Immediate Anesthesia Transfer of Care Note  Patient: Nichole Berg  Procedure(s) Performed: CARDIOVERSION (N/A )  Patient Location: Endoscopy Unit  Anesthesia Type:General  Level of Consciousness: drowsy and patient cooperative  Airway & Oxygen Therapy: Patient Spontanous Breathing  Post-op Assessment: Report given to RN and Post -op Vital signs reviewed and stable  Post vital signs: Reviewed and stable  Last Vitals:  Vitals Value Taken Time  BP    Temp    Pulse    Resp    SpO2      Last Pain:  Vitals:   08/15/20 1038  TempSrc: Oral  PainSc: 0-No pain         Complications: No complications documented.

## 2020-08-15 NOTE — Anesthesia Postprocedure Evaluation (Signed)
Anesthesia Post Note  Patient: Nichole Berg  Procedure(s) Performed: CARDIOVERSION (N/A )     Patient location during evaluation: PACU Anesthesia Type: General Level of consciousness: awake Pain management: pain level controlled Vital Signs Assessment: post-procedure vital signs reviewed and stable Respiratory status: spontaneous breathing Cardiovascular status: stable Postop Assessment: no apparent nausea or vomiting Anesthetic complications: no   No complications documented.  Last Vitals:  Vitals:   08/15/20 1130 08/15/20 1140  BP: (!) 90/52 112/63  Pulse: (!) 56 (!) 47  Resp: 16 (!) 21  Temp:    SpO2: 92% 94%    Last Pain:  Vitals:   08/15/20 1140  TempSrc:   PainSc: 0-No pain                 Terryl Niziolek

## 2020-08-22 DIAGNOSIS — M9901 Segmental and somatic dysfunction of cervical region: Secondary | ICD-10-CM | POA: Diagnosis not present

## 2020-08-22 DIAGNOSIS — M5411 Radiculopathy, occipito-atlanto-axial region: Secondary | ICD-10-CM | POA: Diagnosis not present

## 2020-09-05 NOTE — Telephone Encounter (Signed)
The patient started have palpitations Monday morning. Currently Blood Pressure is 108/73, Heart Rate 63 but irregular. Denies dizziness, chest pain, presyncope or syncope. Patient has not missed any doses of eliqius or metoprolol succinate.  From office note: She is feeling some breakthrough with the short acting metoprolol.  Will change to Toprol-XL 50 mg once a day.  She can continue to have her metoprolol tartrate for breakthrough tachycardia.  As needed  Discussed metoprolol tartrate and if blood pressure allows to take as needed when breakthrough tachycardia occurs.  When heart rate is in the low 100's she has some diaphoresis but no other symptoms.  Gave ED precautions. Advised patient I will forward to Dr. Anne Fu for advisement.

## 2020-09-12 ENCOUNTER — Encounter: Payer: Self-pay | Admitting: Internal Medicine

## 2020-09-12 DIAGNOSIS — Z1231 Encounter for screening mammogram for malignant neoplasm of breast: Secondary | ICD-10-CM | POA: Diagnosis not present

## 2020-09-17 ENCOUNTER — Ambulatory Visit: Payer: Self-pay

## 2020-09-17 ENCOUNTER — Ambulatory Visit: Payer: Medicare PPO | Admitting: Family Medicine

## 2020-09-17 ENCOUNTER — Encounter: Payer: Self-pay | Admitting: Family Medicine

## 2020-09-17 ENCOUNTER — Other Ambulatory Visit: Payer: Self-pay

## 2020-09-17 VITALS — BP 110/80 | HR 42 | Ht 64.0 in | Wt 141.0 lb

## 2020-09-17 DIAGNOSIS — M79645 Pain in left finger(s): Secondary | ICD-10-CM

## 2020-09-17 DIAGNOSIS — M25511 Pain in right shoulder: Secondary | ICD-10-CM

## 2020-09-17 DIAGNOSIS — G8929 Other chronic pain: Secondary | ICD-10-CM

## 2020-09-17 NOTE — Assessment & Plan Note (Signed)
Chronic shoulder pain recommend appears to be a very small intrasubstance tear of the supraspinatus.  Mild retraction noted.  We discussed with patient about home exercises and icing regimen but will start with physical therapy as well.  Anticipate patient doing relatively well.  Patient will follow up with me again now in 6 weeks and if not completely healed I do encourage patient to have an MRI secondary to the possible small retraction noted.  Patient is in agreement with the plan and will follow up at that time.

## 2020-09-17 NOTE — Progress Notes (Signed)
/ [name] [address] [place] [address] Phone: [phone] Subjective:   I [name] am serving as a Neurosurgeon for Dr. Antoine Primas.  This visit occurred during the SARS-CoV-2 public health emergency.  Safety protocols were in place, including screening questions prior to the visit, additional usage of staff PPE, and extensive cleaning of exam room while observing appropriate contact time as indicated for disinfecting solutions.   I'm seeing this patient by the request  of:  Panosh, [name], MD  CC: Right shoulder pain  CHY:[id number]   07/17/2020 Patient responded very well to the injection.  Now though unfortunately has new onset atrial fibrillation.  Is on Metroprolol.  Patient has been doing the exercises.  Patient wants to hold on any physical therapy secondary to the work-up for the atrial fibrillation.  Patient will follow up again in 6 to 10 weeks for further evaluation and treatment.  Worsening pain will consider formal physical therapy, repeat injections or advanced imaging  Update 09/17/2020 Nichole Berg is a 75 y.o. female coming in with complaint of right shoulder pain. Patient states the shoulder was doing well after the last injection. States that today her pain has come back. Achy pain that radiates down the arm. States she can do a lot of things and that some days she has no issues. Pain wakes her up at night. Taking 100 mg of gabapentin at night. States that she wants to get stronger. Left thumb is also having some issues. Believes it is tendonitis.  Patient is considering different type of exercise routines and wants to know which ones she can do.    Past Medical History:  Diagnosis Date  . Cataract 10/18/2011   Followed by opthalmology.   . COLONIC POLYPS, HX OF 10/12/2009  . COVID-19 03/2020  . HYPERLIPIDEMIA 05/31/2007  . NEPHROLITHIASIS, HX OF 05/31/2007   Past Surgical History:  Procedure Laterality Date  . CARDIOVERSION N/A  08/15/2020   Procedure: CARDIOVERSION;  Surgeon: Meriam Sprague, MD;  Location: [facility] ENDOSCOPY;  Service: Cardiovascular;  Laterality: N/A;  . CATARACT EXTRACTION, BILATERAL     Social History   Socioeconomic History  . Marital status: Single    Spouse name: Not on file  . Number of children: Not on file  . Years of education: Not on file  . Highest education level: Not on file  Occupational History  . Occupation: Retired  Tobacco Use  . Smoking status: Former Smoker    Years: 2.00    Types: Cigarettes    Quit date: 09/08/1964    Years since quitting: 56.0  . Smokeless tobacco: Never Used  Vaping Use  . Vaping Use: Never used  Substance and Sexual Activity  . Alcohol use: Yes    Comment: occassional  . Drug use: No  . Sexual activity: Not on file  Other Topics Concern  . 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 Difficulty of Paying Living Expenses: Not hard at all  Food Insecurity: No Food Insecurity  . Worried About Programme researcher, broadcasting/film/video in the Last Year: Never true  . Ran Out of Food in the Last Year: Never true  Transportation Needs: No Transportation Needs  .  Lack of Transportation (Medical): No  . Lack of Transportation (Non-Medical): No  Physical Activity: Sufficiently Active  . Days of Exercise per Week: 7 days  . Minutes of Exercise per Session: 90 min  Stress: No Stress Concern Present  . Feeling of Stress : Only a little  Social Connections: Moderately Isolated  . Frequency of Communication with Friends and Family: More than three times a week  . Frequency of Social Gatherings with Friends and Family: Once a week  . Attends Religious Services: Never  . Active Member of Clubs or Organizations: Yes  . Attends Banker Meetings: 1 to 4 times per year  .  Marital Status: Never married   Allergies  Allergen Reactions  . Sulfonamide Derivatives Itching and Rash   Family History  Problem Relation Age of Onset  . Atrial fibrillation Mother        Has pacer doing very well age 65 passed suddnely age 84 poss CV     Current Outpatient Medications (Cardiovascular):  .  metoprolol succinate (TOPROL-XL) 50 MG 24 hr tablet, Take 1 tablet (50 mg total) by mouth daily. Take with or immediately following a meal.    Current Outpatient Medications (Hematological):  .  apixaban (ELIQUIS) 5 MG TABS tablet, Take 1 tablet (5 mg total) by mouth 2 (two) times daily.  Current Outpatient Medications (Other):  Marland Kitchen  Artificial Tear Ointment (DRY EYES OP), Apply 1 drop to eye daily as needed (dry eyes). .  Cholecalciferol (VITAMIN D) 2000 UNITS tablet, Take 2,000 Units by mouth daily. .  diclofenac Sodium (VOLTAREN) 1 % GEL, Apply 1 application topically 4 (four) times daily as needed (pain.).  Marland Kitchen  gabapentin (NEURONTIN) 100 MG capsule, Take 1 capsule (100 mg total) by mouth at bedtime. (Patient taking differently: Take 100 mg by mouth at bedtime as needed (sleep/pain.).) .  LUTEIN PO, Take 1 tablet by mouth daily. .  melatonin 5 MG TABS, Take 5 mg by mouth at bedtime as needed (sleep.).    Reviewed prior external information including notes and imaging from  primary care provider As well as notes that were available from care everywhere and other healthcare systems.  Past medical history, social, surgical and family history all reviewed in electronic medical record.  No pertanent information unless stated regarding to the chief complaint.   Review of Systems:  No headache, visual changes, nausea, vomiting, diarrhea, constipation, dizziness, abdominal pain, skin rash, fevers, chills, night sweats, weight loss, swollen lymph nodes, body aches, joint swelling, chest pain, shortness of breath, mood changes. POSITIVE muscle aches  Objective  Blood pressure  110/80, pulse (!) 42, height 5\' 4"  (1.626 m), weight 141 lb (64 kg), SpO2 99 %.   General: No apparent distress alert and oriented x3 mood and affect normal, dressed appropriately.  HEENT: Pupils equal, extraocular movements intact  Respiratory: Patient's speak in full sentences and does not appear short of breath  Right shoulder exam shows the patient does have mild positive impingement.  Some more discomfort noted with internal rotation than previously.  Mild positive O'Brien's and positive apprehension sign.  Left wrist does have positive finkelstein test minimal pain over the [facility].  Good grip strength    Limited musculoskeletal ultrasound was performed and interpreted by [facility]  Imaging ultrasound of patient's right shoulder shows significant decrease in the hypoechoic changes from previous exam.  Patient's of supraspinatus does have a tear tear now noted.  Does seem to be a partial tear.  Questionable  small retraction of 0.7 cm noted.  Mild arthritic changes of the acromioclavicular joint Impression: rotator cuff tear partial no inflammation    Impression and Recommendations:     The above documentation has been reviewed and is accurate and complete Judi Saa, DO

## 2020-09-17 NOTE — Patient Instructions (Addendum)
Good to see you [name] [facility] for shoulder and thumb Looks like a small tear of rotator cuff Keep hands within peripheral vision  See me again in 6-8 weeks we will consider MRI

## 2020-09-24 ENCOUNTER — Ambulatory Visit (HOSPITAL_COMMUNITY): Payer: Medicare PPO | Admitting: Physician Assistant

## 2020-09-26 NOTE — Progress Notes (Signed)
Primary Care Physician: Madelin Headings, MD Primary Cardiologist: Dr Anne Fu Primary Electrophysiologist: none Referring Physician: Dr Rinaldo Cloud is a 75 y.o. female with a history of HTN, HLD, and atrial fibrillation who presents for consultation in the [facility]. The patient was initially diagnosed with atrial fibrillation 06/25/20 after presenting to urgent care with symptoms of dizziness and palpitations. Patient is on Eliquis for a CHADS2VASC score of 4. She underwent DCCV on 08/15/20. Unfortunately, she felt she was back in afib with palpitations about 3 weeks later. She is in rate controlled afib today. She denies any significant alcohol use or snoring.   Today, she denies symptoms of chest pain, shortness of breath, orthopnea, PND, lower extremity edema, dizziness, presyncope, syncope, snoring, daytime somnolence, bleeding, or neurologic sequela. The patient is tolerating medications without difficulties and is otherwise without complaint today.    Atrial Fibrillation Risk Factors:  she does not have symptoms or diagnosis of sleep apnea. she does not have a history of rheumatic fever. she does not have a history of alcohol use. The patient does have a history of early familial atrial fibrillation or other arrhythmias. Son has afib.  she has a BMI of Body mass index is 24.17 kg/m.Marland Kitchen Filed Weights   09/27/20 0835  Weight: 63.9 kg    Family History  Problem Relation Age of Onset  . Atrial fibrillation Mother        Has pacer doing very well age 49 passed suddnely age 91 poss CV     Atrial Fibrillation Management history:  Previous antiarrhythmic drugs: none Previous cardioversions: 08/15/20 Previous ablations: none CHADS2VASC score: 4 Anticoagulation history: Eliquis   Past Medical History:  Diagnosis Date  . Cataract 10/18/2011   Followed by opthalmology.   . COLONIC POLYPS, HX OF 10/12/2009  . COVID-19 03/2020  . HYPERLIPIDEMIA  05/31/2007  . NEPHROLITHIASIS, HX OF 05/31/2007   Past Surgical History:  Procedure Laterality Date  . CARDIOVERSION N/A 08/15/2020   Procedure: CARDIOVERSION;  Surgeon: Meriam Sprague, MD;  Location: [facility] ENDOSCOPY;  Service: Cardiovascular;  Laterality: N/A;  . CATARACT EXTRACTION, BILATERAL      Current Outpatient Medications  Medication Sig Dispense Refill  . apixaban (ELIQUIS) 5 MG TABS tablet Take 1 tablet (5 mg total) by mouth 2 (two) times daily. 180 tablet 6  . Artificial Tear Ointment (DRY EYES OP) Apply 1 drop to eye daily as needed (dry eyes).    . Cholecalciferol (VITAMIN D) 2000 UNITS tablet Take 2,000 Units by mouth daily.    . diclofenac Sodium (VOLTAREN) 1 % GEL Apply 1 application topically 4 (four) times daily as needed (pain.).     Marland Kitchen gabapentin (NEURONTIN) 100 MG capsule Take 1 capsule (100 mg total) by mouth at bedtime. (Patient taking differently: Take 100 mg by mouth at bedtime as needed (sleep/pain.).) 180 capsule 0  . LUTEIN PO Take 1 tablet by mouth daily.    . melatonin 5 MG TABS Take 5 mg by mouth at bedtime as needed (sleep.).     Marland Kitchen metoprolol succinate (TOPROL-XL) 50 MG 24 hr tablet Take 1 tablet (50 mg total) by mouth daily. Take with or immediately following a meal. 90 tablet 3   No current facility-administered medications for this encounter.    Allergies  Allergen Reactions  . Sulfonamide Derivatives Itching and Rash    Social History   Socioeconomic History  . Marital status: Single    Spouse name: Not on file  .  Number of children: Not on file  . Years of education: Not on file  . Highest education level: Not on file  Occupational History  . Occupation: Retired  Tobacco Use  . Smoking status: Former Smoker    Years: 2.00    Types: Cigarettes    Quit date: 09/08/1964    Years since quitting: 56.0  . Smokeless tobacco: Never Used  Vaping Use  . Vaping Use: Never used  Substance and Sexual Activity  . Alcohol use: Yes    Comment:  occassional  . Drug use: No  . Sexual activity: Not on file  Other Topics Concern  . 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 Difficulty of Paying Living Expenses: Not hard at all  Food Insecurity: No Food Insecurity  . Worried About Programme researcher, broadcasting/film/video in the Last Year: Never true  . Ran Out of Food in the Last Year: Never true  Transportation Needs: No Transportation Needs  . Lack of Transportation (Medical): No  . Lack of Transportation (Non-Medical): No  Physical Activity: Sufficiently Active  . Days of Exercise per Week: 7 days  . Minutes of Exercise per Session: 90 min  Stress: No Stress Concern Present  . Feeling of Stress : Only a little  Social Connections: Moderately Isolated  . Frequency of Communication with Friends and Family: More than three times a week  . Frequency of Social Gatherings with Friends and Family: Once a week  . Attends Religious Services: Never  . Active Member of Clubs or Organizations: Yes  . Attends Banker Meetings: 1 to 4 times per year  . Marital Status: Never married  Intimate Partner Violence: Not At Risk  . Fear of Current or Ex-Partner: No  . Emotionally Abused: No  . Physically Abused: No  . Sexually Abused: No     ROS- All systems are reviewed and negative except as per the HPI above.  Physical Exam: Vitals:   09/27/20 0835  BP: 107/69  Weight: 63.9 kg    GEN- The patient is well appearing elderly female, alert and oriented x 3 today.   Head- normocephalic, atraumatic Eyes-  Sclera clear, conjunctiva pink Ears- hearing intact Oropharynx- clear Neck- supple  Lungs- Clear to ausculation bilaterally, normal work of breathing Heart- irregular rate and rhythm, no murmurs, rubs or gallops  GI- soft, NT, ND, + BS Extremities-  no clubbing, cyanosis, or edema MS- no significant deformity or atrophy Skin- no rash or lesion Psych- euthymic mood, full affect Neuro- strength and sensation are intact  Wt Readings from Last 3 Encounters:  09/27/20 63.9 kg  09/17/20 64 kg  08/15/20 62.1 kg    EKG today demonstrates  Afib Vent. rate 81 BPM QRS duration 80 ms QT/QTc 400/464 ms  Echo 08/10/20 demonstrated  1. Left ventricular ejection fraction, by estimation, is 60 to 65%. The  left ventricle has normal function. The left ventricle has no regional  wall motion abnormalities. Left ventricular diastolic parameters are  indeterminate.  2. Right ventricular systolic function is normal. The right ventricular  size is normal. There is normal pulmonary artery systolic pressure.  3. Left atrial size was moderately dilated.  4. Right atrial size was mildly  dilated.  5. The mitral valve is degenerative. Mild mitral valve regurgitation. No  evidence of mitral stenosis. Moderate mitral annular calcification.  6. Tricuspid valve regurgitation is mild to moderate.  7. The aortic valve is tricuspid. Aortic valve regurgitation is not  visualized. Mild to moderate aortic valve sclerosis/calcification is  present, without any evidence of aortic stenosis.  8. The inferior vena cava is normal in size with greater than 50%  respiratory variability, suggesting right atrial pressure of 3 mmHg.  Epic records are reviewed at length today  CHA2DS2-VASc Score = 4  The patient's score is based upon: CHF History: No HTN History: Yes Diabetes History: No Stroke History: No Vascular Disease History: No Age Score: 2 Gender Score: 1      ASSESSMENT AND PLAN: 1. Persistent Atrial Fibrillation (ICD10:  I48.19) The patient's CHA2DS2-VASc score is 4, indicating a 4.8% annual risk of stroke.   General education about afib provided and questions answered. We also discussed her stroke risk and the risks and benefits of  anticoagulation. S/p DCCV on 08/15/20 She is back in rate controlled afib. We discussed therapeutic options today including AAD and ablation. She would like to avoid medications if possible. She is interested in front-line ablation. Will refer to EP.  Continue Eliquis 5 mg BID Continue Toprol 50 mg daily  2. Secondary Hypercoagulable State (ICD10:  D68.69) The patient is at significant risk for stroke/thromboembolism based upon her CHA2DS2-VASc Score of 4.  Continue Apixaban (Eliquis).   3. HTN Stable, no changes today.   Follow up with EP for ablation consideration.    Jorja Loa PA-C Afib Clinic [facility] [address] [place], [place] [address] [phone] 09/27/2020 9:22 AM

## 2020-09-27 ENCOUNTER — Other Ambulatory Visit: Payer: Self-pay

## 2020-09-27 ENCOUNTER — Ambulatory Visit (HOSPITAL_COMMUNITY)
Admission: RE | Admit: 2020-09-27 | Discharge: 2020-09-27 | Disposition: A | Discharge status: Home / Self Care | Payer: Medicare PPO | Source: Ambulatory Visit | Attending: Physician Assistant | Admitting: Physician Assistant

## 2020-09-27 VITALS — BP 107/69 | Wt 140.8 lb

## 2020-09-27 DIAGNOSIS — I4891 Unspecified atrial fibrillation: Secondary | ICD-10-CM | POA: Diagnosis not present

## 2020-09-27 DIAGNOSIS — I10 Essential (primary) hypertension: Secondary | ICD-10-CM | POA: Insufficient documentation

## 2020-09-27 DIAGNOSIS — Z8249 Family history of ischemic heart disease and other diseases of the circulatory system: Secondary | ICD-10-CM | POA: Insufficient documentation

## 2020-09-27 DIAGNOSIS — M9901 Segmental and somatic dysfunction of cervical region: Secondary | ICD-10-CM | POA: Diagnosis not present

## 2020-09-27 DIAGNOSIS — D6869 Other thrombophilia: Secondary | ICD-10-CM | POA: Insufficient documentation

## 2020-09-27 DIAGNOSIS — M5411 Radiculopathy, occipito-atlanto-axial region: Secondary | ICD-10-CM | POA: Diagnosis not present

## 2020-09-27 DIAGNOSIS — Z7901 Long term (current) use of anticoagulants: Secondary | ICD-10-CM | POA: Diagnosis not present

## 2020-09-27 DIAGNOSIS — I4819 Other persistent atrial fibrillation: Secondary | ICD-10-CM | POA: Insufficient documentation

## 2020-09-27 DIAGNOSIS — Z79899 Other long term (current) drug therapy: Secondary | ICD-10-CM | POA: Diagnosis not present

## 2020-09-27 DIAGNOSIS — Z87891 Personal history of nicotine dependence: Secondary | ICD-10-CM | POA: Insufficient documentation

## 2020-09-27 DIAGNOSIS — E785 Hyperlipidemia, unspecified: Secondary | ICD-10-CM | POA: Insufficient documentation

## 2020-10-16 ENCOUNTER — Other Ambulatory Visit: Payer: Self-pay

## 2020-10-16 ENCOUNTER — Ambulatory Visit: Payer: Medicare PPO | Admitting: Cardiology

## 2020-10-16 ENCOUNTER — Encounter: Payer: Self-pay | Admitting: Cardiology

## 2020-10-16 VITALS — BP 100/68 | HR 85 | Ht 64.0 in | Wt 137.4 lb

## 2020-10-16 DIAGNOSIS — I10 Essential (primary) hypertension: Secondary | ICD-10-CM | POA: Diagnosis not present

## 2020-10-16 DIAGNOSIS — I4819 Other persistent atrial fibrillation: Secondary | ICD-10-CM

## 2020-10-16 DIAGNOSIS — Z01812 Encounter for preprocedural laboratory examination: Secondary | ICD-10-CM | POA: Diagnosis not present

## 2020-10-16 NOTE — Patient Instructions (Signed)
Medication Instructions:  Your physician recommends that you continue on your current medications as directed. Please refer to the Current Medication list given to you today.  *If you need a refill on your cardiac medications before your next appointment, please call your pharmacy*   Lab Work: None ordered today.  If you have labs (blood work) drawn today and your tests are completely normal, you will receive your results only by: Marland Kitchen MyChart Message (if you have MyChart) OR . A paper copy in the mail If you have any lab test that is abnormal or we need to change your treatment, we will call you to review the results.   Testing/Procedures: Your physician has recommended that you have an ablation. Catheter ablation is a medical procedure used to treat some cardiac arrhythmias (irregular heartbeats). During catheter ablation, a long, thin, flexible tube is put into a blood vessel in your groin (upper thigh), or neck. This tube is called an ablation catheter. It is then guided to your heart through the blood vessel. Radio frequency waves destroy small areas of heart tissue where abnormal heartbeats may cause an arrhythmia to start. Please see the instruction sheet given to you today.    Follow-Up: At [facility], you and your health needs are our priority.  As part of our continuing mission to provide you with exceptional heart care, we have created designated Provider Care Teams.  These Care Teams include your primary Cardiologist (physician) and Advanced Practice Providers (APPs -  Physician Assistants and Nurse Practitioners) who all work together to provide you with the care you need, when you need it.  We recommend signing up for the patient portal called "MyChart".  Sign up information is provided on this After Visit Summary.  MyChart is used to connect with patients for Virtual Visits (Telemedicine).  Patients are able to view lab/test results, encounter notes, upcoming appointments, etc.   Non-urgent messages can be sent to your provider as well.   To learn more about what you can do with MyChart, go to [email or web address].    Your next appointment:   Testing and follow up appointments to be scheduled

## 2020-10-16 NOTE — Progress Notes (Addendum)
Electrophysiology Office Note:    Date:  10/24/2020   ID:  Nichole Berg, DOB 1946-03-09, MRN [id number]  PCP:  Madelin Headings, MD  [facility] Cardiologist:  Donato Schultz, MD  [facility] Electrophysiologist:  Lanier Prude, MD   Referring MD: Danice Goltz, PA   Chief Complaint: Symptomatic persistent atrial fibrillation  History of Present Illness:    Nichole Berg is a 75 y.o. female who presents for an evaluation of symptomatic persistent atrial fibrillation at the request of Jorja Loa, PA-C. Their medical history includes hyperlipidemia.  She was diagnosed with atrial fibrillation in October 2021 after she presented to an urgent care with dizziness and palpitations.  She underwent a cardioversion on August 15, 2020 but she had early return to atrial fibrillation approximately 3 weeks later.  She is in rate controlled atrial fibrillation on her EKG today.  She tells me that she does feel palpitations but denies presyncope or syncope.  She does not describe any shortness of breath associated with her A. fib.  She is tolerating her Eliquis without any bleeding issues.  She is a retired Publishing rights manager.  She previously worked at [employer].  She is not interested in using medications to help control her arrhythmias and would like to discuss ablative therapy.    Past Medical History:  Diagnosis Date  . Cataract 10/18/2011   Followed by opthalmology.   . COLONIC POLYPS, HX OF 10/12/2009  . COVID-19 03/2020  . HYPERLIPIDEMIA 05/31/2007  . NEPHROLITHIASIS, HX OF 05/31/2007    Past Surgical History:  Procedure Laterality Date  . CARDIOVERSION N/A 08/15/2020   Procedure: CARDIOVERSION;  Surgeon: Meriam Sprague, MD;  Location: [facility] END[facility]COPY;  Service: Cardiovascular;  Laterality: N/A;  . CATARACT EXTRACTION, BILATERAL      Current Medications: Current Meds  Medication Sig  . apixaban (ELIQUIS) 5 MG TABS tablet Take 1 tablet (5 mg total) by mouth 2 (two) times  daily.  . Artificial Tear Ointment (DRY EYES OP) Apply 1 drop to eye daily as needed (dry eyes).  . Ascorbic Acid (VITAMIN C) 1000 MG tablet Take 1,000 mg by mouth daily.  . Cholecalciferol (VITAMIN D) 2000 UNITS tablet Take 2,000 Units by mouth daily.  . diclofenac Sodium (VOLTAREN) 1 % GEL Apply 1 application topically 4 (four) times daily as needed (pain.).   Marland Kitchen gabapentin (NEURONTIN) 100 MG capsule Take 1 capsule (100 mg total) by mouth at bedtime.  . LUTEIN PO Take 1 tablet by mouth daily.  . melatonin 5 MG TABS Take 5 mg by mouth at bedtime as needed (sleep.).   Marland Kitchen metoprolol succinate (TOPROL-XL) 50 MG 24 hr tablet Take 1 tablet (50 mg total) by mouth daily. Take with or immediately following a meal.     Allergies:   Sulfonamide derivatives   Social History   Socioeconomic History  . Marital status: Single    Spouse name: Not on file  . Number of children: Not on file  . Years of education: Not on file  . Highest education level: Not on file  Occupational History  . Occupation: Retired  Tobacco Use  . Smoking status: Former Smoker    Years: 2.00    Types: Cigarettes    Quit date: 09/08/1964    Years since quitting: 56.1  . Smokeless tobacco: Never Used  Vaping Use  . Vaping Use: Never used  Substance and Sexual Activity  . Alcohol use: Yes    Comment: occassional  . Drug use:  No  . Sexual activity: Not on file  Other Topics Concern  . 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 Difficulty of Paying Living Expenses: Not hard at all  Food Insecurity: No Food Insecurity  . Worried About Programme researcher, broadcasting/film/video in the Last Year: Never true  . Ran Out of Food in the Last Year: Never true  Transportation Needs: No Transportation Needs  . Lack of Transportation (Medical): No  . Lack of  Transportation (Non-Medical): No  Physical Activity: Sufficiently Active  . Days of Exercise per Week: 7 days  . Minutes of Exercise per Session: 90 min  Stress: No Stress Concern Present  . Feeling of Stress : Only a little  Social Connections: Moderately Isolated  . Frequency of Communication with Friends and Family: More than three times a week  . Frequency of Social Gatherings with Friends and Family: Once a week  . Attends Religious Services: Never  . Active Member of Clubs or Organizations: Yes  . Attends Banker Meetings: 1 to 4 times per year  . Marital Status: Never married     Family History: The patient's family history includes Atrial fibrillation in her mother.  ROS:   Please see the history of present illness.    All other systems reviewed and are negative.  EKGs/Labs/Other Studies Reviewed:    The following studies were reviewed today:  August 10, 2020 echo personally reviewed Left ventricular function normal, 60% Normal right ventricular function Moderately dilated left atrium Mild mitral regurgitation  EKG:  The ekg ordered today demonstrates atrial fibrillation with a ventricular rate of 85 bpm.  Her QTC is 447 ms and a QRS duration of 88 ms  Recent Labs: 07/19/2020: TSH 1.410 08/10/2020: BUN 13; Creatinine, Ser 0.79; Hemoglobin 12.1; Platelets 244; Potassium 4.2; Sodium 140  Recent Lipid Panel    Component Value Date/Time   CHOL 256 (H) 10/17/2019 1058   TRIG 86.0 10/17/2019 1058   HDL 71.20 10/17/2019 1058   CHOLHDL 4 10/17/2019 1058   VLDL 17.2 10/17/2019 1058   LDLCALC 167 (H) 10/17/2019 1058   LDLDIRECT 160.3 10/13/2011 1058    Physical Exam:    VS:  BP 100/68   Pulse 85   Ht 5\' 4"  (1.626 m)   Wt 137 lb 6.4 oz (62.3 kg)   SpO2 98%   BMI 23.58 kg/m     Wt Readings from Last 3 Encounters:  10/16/20 137 lb 6.4 oz (62.3 kg)  09/27/20 140 lb 12.8 oz (63.9 kg)  09/17/20 141 lb (64 kg)     GEN: Well nourished, well  developed in no acute distress.  Appears younger than stated age. HEENT: Normal NECK: No JVD; No carotid bruits LYMPHATICS: No lymphadenopathy CARDIAC: Irregularly irregular, no murmurs, rubs, gallops RESPIRATORY:  Clear to auscultation without rales, wheezing or rhonchi  ABDOMEN: Soft, non-tender, non-distended MUSCULOSKELETAL:  No edema; No deformity  SKIN: Warm and dry NEUROLOGIC:  Alert and oriented x 3 PSYCHIATRIC:  Normal affect   ASSESSMENT:    1. Persistent atrial fibrillation (HCC)   2. Essential hypertension   3. Pre-procedure lab exam    PLAN:    In order of problems listed above:  1. Symptomatic  persistent atrial fibrillation Patient has relatively recent onset persistent symptomatic atrial fibrillation.  She is tolerating anticoagulation with eliquis.  I discussed the treatment options for her atrial arrhythmia including continued rate control and stroke prophylaxis, rhythm control using an antiarrhythmic and rhythm control using ablation therapy.  She would like to avoid long-term medication use at all possible and would like to pursue ablation to manage her atrial fibrillation.  I discussed the risks, expected efficacy of ablation and expected recovery time of the procedure.  She would like to proceed.  She will continue her eliquis uninterrupted until the time of ablation.   Risk, benefits, and alternatives to EP study and radiofrequency ablation for afib were also discussed in detail today. These risks include but are not limited to stroke, bleeding, vascular damage, tamponade, perforation, damage to the esophagus, lungs, and other structures, pulmonary vein stenosis, worsening renal function, and death. The patient understands these risk and wishes to proceed.  We will therefore proceed with catheter ablation at the next available time.  Carto, ICE, anesthesia are requested for the procedure.  Will also obtain CT PV protocol prior to the procedure to exclude LAA thrombus  and further evaluate atrial anatomy.    Medication Adjustments/Labs and Tests Ordered: Current medicines are reviewed at length with the patient today.  Concerns regarding medicines are outlined above.  Orders Placed This Encounter  Procedures  . CT CARDIAC MORPH/PULM VEIN W/CM&W/O CA SCORE  . CT CORONARY FRACTIONAL FLOW RESERVE DATA PREP  . CT CORONARY FRACTIONAL FLOW RESERVE FLUID ANALYSIS  . CBC with Differential/Platelet  . Basic metabolic panel  . EKG 12-Lead   No orders of the defined types were placed in this encounter.    Signed, Steffanie Dunn, MD, [facility]  10/24/2020 1:45 PM    Electrophysiology [facility]

## 2020-10-17 NOTE — Addendum Note (Signed)
Addended by: Alois Cliche on: 10/17/2020 12:01 PM   Modules accepted: Orders

## 2020-10-25 MED ORDER — METOPROLOL TARTRATE 50 MG PO TABS: ORAL_TABLET | ORAL | 0 refills | Status: DC

## 2020-10-26 NOTE — Addendum Note (Signed)
Addended by: Roney Mans A on: 10/26/2020 04:25 PM   Modules accepted: Orders

## 2020-10-29 ENCOUNTER — Encounter: Payer: Self-pay | Admitting: Family Medicine

## 2020-10-30 ENCOUNTER — Ambulatory Visit: Payer: Medicare PPO | Admitting: Physical Therapy

## 2020-10-30 ENCOUNTER — Other Ambulatory Visit: Payer: Self-pay

## 2020-10-30 ENCOUNTER — Encounter: Payer: Self-pay | Admitting: Physical Therapy

## 2020-10-30 VITALS — BP 126/72 | HR 70

## 2020-10-30 DIAGNOSIS — M25511 Pain in right shoulder: Secondary | ICD-10-CM | POA: Diagnosis not present

## 2020-10-30 DIAGNOSIS — M6281 Muscle weakness (generalized): Secondary | ICD-10-CM | POA: Diagnosis not present

## 2020-10-30 DIAGNOSIS — M25611 Stiffness of right shoulder, not elsewhere classified: Secondary | ICD-10-CM | POA: Diagnosis not present

## 2020-10-30 NOTE — Therapy (Addendum)
[facility] [address] [place], [name], [id number] Phone: [phone]   Fax:  [phone]  Physical Therapy Evaluation  Patient Details  Name: Nichole BlitzCarol Berg MRN: [id number] Date of Birth: 1945/12/05 Referring Provider (PT): Dr. Antoine PrimasZachary Smith   Encounter Date: 10/30/2020   PT End of Session - 10/30/20 1008    Visit Number 1    Number of Visits 13    Date for PT Re-Evaluation 11/29/20    Authorization Type [employer] Medicare    PT Start Time 0845    PT Stop Time 0945    PT Time Calculation (min) 60 min    Activity Tolerance Patient tolerated treatment well    Behavior [facility] for tasks assessed/performed           Past Medical History:  Diagnosis Date  . Cataract 10/18/2011   Followed by opthalmology.   . COLONIC POLYPS, HX OF 10/12/2009  . COVID-19 03/2020  . HYPERLIPIDEMIA 05/31/2007  . NEPHROLITHIASIS, HX OF 05/31/2007    Past Surgical History:  Procedure Laterality Date  . CARDIOVERSION N/A 08/15/2020   Procedure: CARDIOVERSION;  Surgeon: Meriam SpraguePemberton, Heather E, MD;  [facility];  Service: Cardiovascular;  Laterality: N/A;  . CATARACT EXTRACTION, BILATERAL      Vitals:   10/30/20 0908  BP: 126/72  Pulse: 70  SpO2: 99%      Subjective Assessment - 10/30/20 0848    Subjective Pt states the pain initially started almost 15 years ago. She states she initially hurted doing some shoulder weight training. She states she had an MRI initially showing two partial tears. She had some PT in the past for the same issue. She states is currently very active still. She feels like she can do most things except for reaching. She reports an injection in October that made it feel much better.She felt it came back about 6-8 weeks ago. She feels like it deep aches down the arm. Pt states she would like to avoid surgery. She is currently some exercises like stretching, shoulder ER, and shoulder blade squeezes. She states she is a  R side sleeper. She is an avid golfer and she is R handed. She has pain with ADL but she is able to complete things with mild/mod discomfort. Pt states the pain will last for at least 30 mins befor ethe pain calms down. Worst pain is 7/10. She walks her dog, hikes, biking, golfing, and does yoga for exercise. She has to modify her yoga positions a lot due to the pain "pinch" in the shoulder. Pt denies red flags and systemic issues. Endorses pain at night. Pt states she feels crepitus with shoulder movements but does not feel locking.    Limitations Lifting;House hold activities    Diagnostic tests MRI, US    Patient Stated Goals Pt states she would lke to strengthen upper body without pain.    Currently in Pain? Yes    Pain Score 1     Pain Location Shoulder    Pain Orientation Right    Pain Descriptors / Indicators Sharp;Aching;Sore;Tender    Pain Type Chronic pain    Pain Radiating Towards triceps and elbow    Pain Onset More than a month ago    Pain Frequency Intermittent    Aggravating Factors  reaching backwards, reaching OH, sleeping on it,    Pain Relieving Factors Voltaren gel, ice, rest              [facility] PT Assessment -  10/30/20 0001      Assessment   Medical Diagnosis R RTC Pain    Referring Provider (PT) Dr. Antoine Primas    Hand Dominance Right    Prior Therapy Previous R shoulder      Precautions   Precautions None      Restrictions   Weight Bearing Restrictions No      Balance Screen   Has the patient fallen in the past 6 months No    Has the patient had a decrease in activity level because of a fear of falling?  No    Is the patient reluctant to leave their home because of a fear of falling?  No      Home Tourist information centre manager residence      Prior Function   Level of Independence Independent      Cognition   Overall Cognitive Status Within Functional Limits for tasks assessed      Observation/Other Assessments   Other Surveys  Quick  Dash    Quick DASH  14% Impairment      Sensation   Light Touch Appears Intact      ROM / Strength   AROM / PROM / Strength AROM;PROM;Strength      AROM   Overall AROM Comments pain at end range with all R shoulder movements    AROM Assessment Site Shoulder    Right/Left Shoulder Right;Left    Right Shoulder Flexion 156 Degrees    Right Shoulder ABduction 157 Degrees    Right Shoulder Internal Rotation 62 Degrees    Right Shoulder External Rotation 46 Degrees    Left Shoulder Flexion 150 Degrees    Left Shoulder ABduction 161 Degrees    Left Shoulder Internal Rotation 64 Degrees    Left Shoulder External Rotation 51 Degrees      PROM   Overall PROM  Within functional limits for tasks performed    Overall PROM Comments pain at end range "pinch"      Strength   Overall Strength Deficits    Overall Strength Comments 4/5 throughout, pain limited with resistance      Flexibility   Soft Tissue Assessment /Muscle Length yes      Palpation   Palpation comment TTP of R UT, infra, deltoid      Special Tests    Special Tests Biceps/Labral Tests;Laxity/Instability Tests;Rotator Cuff Impingement    Rotator Cuff Impingment tests Leanord Asal test;Empty Can test;Painful Arc of Motion    Biceps/Labral tests Other      Neer Impingement test    Findings Positive    Side Right      Hawkins-Kennedy test   Findings Positive    Side Right      Empty Can test   Findings Positive    Side Right      Painful Arc of Motion   Findings Positive    Side Right      other   Findings Negative    Comment Biceps load II                      Objective measurements completed on examination: See above findings.       [facility] Adult PT Treatment/Exercise - 10/30/20 0001      Exercises   Exercises Neck;Shoulder      Shoulder Exercises: Supine   Protraction Strengthening;AROM    Protraction Limitations 3x10 with ER      Shoulder Exercises: Standing  Row Theraband;10  reps    Theraband Level (Shoulder Row) Level 3 (Green)    Row Limitations 3x10    Other Standing Exercises ER shoulder iso hold 3s 10x      Manual Therapy   Manual Therapy Joint mobilization;Soft tissue mobilization                  PT Education - 10/30/20 0841    Education Details MOI, diagnosis, prognosis, anatomy, joint protection strategies, acceptable level of pain, HEP    Person(s) Educated Patient    Methods Explanation;Verbal cues;Handout;Demonstration    Comprehension Verbalized understanding;Returned demonstration            PT Short Term Goals - 10/30/20 1213      PT SHORT TERM GOAL #1   Title Pt will demonstrate independence with HEP in order to demonstrate synthesis of PT and compliance education.    Time 2    Period Weeks    Status New      PT SHORT TERM GOAL #2   Title Pt will demonstrate ability to reach to end range without pain in order to demonstrate functional decreased in tissue sensitivity and functional improvement in ROM.    Time 4    Period Weeks    Status New             PT Long Term Goals - 10/30/20 1221      PT LONG TERM GOAL #1   Title Pt will demonstrate ability to perform lifting and carry >10 lbs overhead without pain in order to demonstrate functional improvement in R UE mobility and strength.    Time 6    Period Weeks    Status New                  Plan - 10/30/20 0623    Clinical Impression Statement Pt is a 75 y.o. female presenting to PT eval today with cc of chronic R shoulder pain. Pt presents with limited A/PROM, decreased R shoulder strength, and difficulty/pain with reaching tasks. Pt's s/s are consistent with history of R RTC internal derangement and associated weakness. In particular, pt is most limited in flexion and ER ROM and strength. Pt's symptoms are mildly irritable and sensitive with minimal lingering pain. Pt's current impairments limit her ability to fully perform ADL, household duties, and  recreation/physical fitness. Pt would benefit from continued skilled therapy in order reach goals and maximize functional R UE strenth and mobility for prevention of continued functional decline.    Personal Factors and Comorbidities Age    Examination-Activity Limitations Bathing;Dressing;Lift;Carry;Caring for Others    Examination-Participation Restrictions Other;Yard Work;Cleaning    Stability/Clinical Decision Making Stable/Uncomplicated    Clinical Decision Making Low    Rehab Potential Good    PT Frequency 2x / week    PT Duration 6 weeks    PT Treatment/Interventions ADLs/Self Care Home Management;Biofeedback;Cryotherapy;Electrical Stimulation;Iontophoresis 4mg /ml Dexamethasone;Moist Heat;Aquatic Therapy;Traction;Ultrasound;Functional mobility training;Therapeutic activities;Therapeutic exercise;Neuromuscular re-education;Cognitive remediation;Patient/family education;Manual techniques;Passive range of motion;Dry needling;Taping;Spinal Manipulations;Joint Manipulations    PT Next Visit Plan Review HEP, STM/joint mob, wall weight shift, ABD iso hold    Consulted and Agree with Plan of Care Patient           Patient will benefit from skilled therapeutic intervention in order to improve the following deficits and impairments:  Decreased endurance,Decreased mobility,Increased muscle spasms,Impaired flexibility,Decreased strength,Decreased range of motion,Pain  Visit Diagnosis: Pain in joint of right shoulder - Plan: PT plan of care cert/re-cert  Decreased  right shoulder range of motion - Plan: PT plan of care cert/re-cert  Muscle weakness (generalized) - Plan: PT plan of care cert/re-cert   HEP/Pt instructions Date: 10/30/2020 Prepared by: Zebedee Iba  Exercises Supine Scapular Protraction in Flexion with Dumbbells - 2 x daily - 7 x weekly - 3 sets - 10 reps Standing Shoulder Row with Anchored Resistance - 2 x daily - 7 x weekly - 3 sets - 10 reps Standing Isometric Shoulder  External Rotation with Doorway - 1 x daily - 7 x weekly - 1 sets - 10 reps - 3s hold      Problem List Patient Active Problem List   Diagnosis Date Noted  . Persistent atrial fibrillation (HCC) 09/27/2020  . Secondary hypercoagulable state (HCC) 09/27/2020  . Chronic right shoulder pain 06/12/2020  . Leg cramping 07/20/2018  . Frozen shoulder 06/01/2018  . Low back pain 02/11/2018  . Other bursal cyst of wrist, left 01/21/2017  . 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 Degenerative arthritis of cervical spine 12/04/2015  . Nonallopathic lesion of cervical region 12/04/2015  . Nonallopathic lesion of thoracic region 12/04/2015  . Nonallopathic lesion-rib cage 12/04/2015  . Hyperglycemia 06/16/2014  . Medicare annual wellness visit, subsequent 06/16/2014  . Visit for preventive health examination 10/13/2011  . COUGH 10/12/2009  . COLONIC POLYPS, HX OF 10/12/2009  . SYMPTOM, INSOMNIA NOS 06/30/2007  . Hyperlipidemia 05/31/2007  . Osteopenia 05/31/2007  . NEPHROLITHIASIS, HX OF 05/31/2007   Zebedee Iba PT, DPT 10/30/20 12:35 PM   [facility] [address] [place], [place], [address] Phone: [phone]   Fax:  [phone]  Name: Nichole Berg MRN: [id number] Date of Birth: 06/21/1946

## 2020-10-30 NOTE — Patient Instructions (Signed)
Date: 10/30/2020 Prepared by: Zebedee Iba  Exercises Supine Scapular Protraction in Flexion with Dumbbells - 2 x daily - 7 x weekly - 3 sets - 10 reps Standing Shoulder Row with Anchored Resistance - 2 x daily - 7 x weekly - 3 sets - 10 reps Standing Isometric Shoulder External Rotation with Doorway - 1 x daily - 7 x weekly - 1 sets - 10 reps - 3s hold

## 2020-11-01 DIAGNOSIS — M5411 Radiculopathy, occipito-atlanto-axial region: Secondary | ICD-10-CM | POA: Diagnosis not present

## 2020-11-01 DIAGNOSIS — M9901 Segmental and somatic dysfunction of cervical region: Secondary | ICD-10-CM | POA: Diagnosis not present

## 2020-11-02 ENCOUNTER — Other Ambulatory Visit: Payer: Self-pay

## 2020-11-02 ENCOUNTER — Ambulatory Visit: Payer: Medicare PPO | Admitting: Physical Therapy

## 2020-11-02 ENCOUNTER — Encounter: Payer: Self-pay | Admitting: Physical Therapy

## 2020-11-02 DIAGNOSIS — M25511 Pain in right shoulder: Secondary | ICD-10-CM

## 2020-11-02 DIAGNOSIS — M25611 Stiffness of right shoulder, not elsewhere classified: Secondary | ICD-10-CM | POA: Diagnosis not present

## 2020-11-02 DIAGNOSIS — M6281 Muscle weakness (generalized): Secondary | ICD-10-CM

## 2020-11-02 NOTE — Therapy (Signed)
[facility] [address] [place], [place], [address] Phone: [phone]   Fax:  [phone]  Physical Therapy Treatment  Patient Details  Name: Nichole Berg MRN: [id number] Date of Birth: Oct 06, 1945 Referring Provider (PT): Dr. Antoine Primas   Encounter Date: 11/02/2020   PT End of Session - 11/02/20 0945    Visit Number 2    Number of Visits 13    Date for PT Re-Evaluation 11/29/20    Authorization Type [employer]    PT Start Time [phone]    PT Stop Time 0930    PT Time Calculation (min) 43 min    Activity Tolerance Patient tolerated treatment well    Behavior During Therapy [facility] for tasks assessed/performed           Past Medical History:  Diagnosis Date  . Cataract 10/18/2011   Followed by opthalmology.   . COLONIC POLYPS, HX OF 10/12/2009  . COVID-19 03/2020  . HYPERLIPIDEMIA 05/31/2007  . NEPHROLITHIASIS, HX OF 05/31/2007    Past Surgical History:  Procedure Laterality Date  . CARDIOVERSION N/A 08/15/2020   Procedure: CARDIOVERSION;  Surgeon: Meriam Sprague, MD;  Location: [facility] ENDOSCOPY;  Service: Cardiovascular;  Laterality: N/A;  . CATARACT EXTRACTION, BILATERAL      There were no vitals filed for this visit.   Subjective Assessment - 11/02/20 0853    Subjective Pt states the shoulder is a little more sore today but she is is not sure if this is from the exercises or the weather. She states she feels that the soreness is around the shoulder. She states she does not have any trouble with HEP other than the shoulder ER isometric bc she feels she cannot go very far.    Limitations Lifting;House hold activities    Diagnostic tests MRI, [place]    Patient Stated Goals Pt states she would lke to strengthen upper body without pain.    Currently in Pain? Yes    Pain Score 2     Pain Location Shoulder    Pain Orientation Right    Pain Descriptors / Indicators Aching;Sore    Pain Onset More than a month ago    Pain Frequency  Intermittent                             [facility] Adult PT Treatment/Exercise - 11/02/20 0001      Exercises   Exercises Neck;Shoulder      Shoulder Exercises: Supine   Protraction Strengthening;AROM    Protraction Weight (lbs) 1 lb    Protraction Limitations 3x10 with ER      Shoulder Exercises: Standing   Row Theraband;10 reps    Theraband Level (Shoulder Row) Level 3 (Green)    Row Limitations 3x10    Shoulder Elevation AAROM    Shoulder Elevation Limitations wall wash/walk 2x10    Other Standing Exercises ER shoulder iso hold 3s 10x    Other Standing Exercises Wall ball CW an CCW 20x      Manual Therapy   Manual Therapy Joint mobilization;Soft tissue mobilization    Joint Mobilization LAD grade II with inf grade II mob    Soft tissue mobilization R infra, deltoid, supra, UT                  PT Education - 11/02/20 0944    Education Details anatomy, joint protection strategies, acceptable level of pain, HEP    Person(s)  Educated Patient    Methods Explanation;Demonstration;Handout;Tactile cues    Comprehension Verbalized understanding;Returned demonstration            PT Short Term Goals - 10/30/20 1213      PT SHORT TERM GOAL #1   Title Pt will demonstrate independence with HEP in order to demonstrate synthesis of PT and compliance education.    Time 2    Period Weeks    Status New      PT SHORT TERM GOAL #2   Title Pt will demonstrate ability to reach to end range without pain in order to demonstrate functional decreased in tissue sensitivity and functional improvement in ROM.    Time 4    Period Weeks    Status New             PT Long Term Goals - 10/30/20 1221      PT LONG TERM GOAL #1   Title Pt will demonstrate ability to perform lifting and carry >10 lbs overhead without pain in order to demonstrate functional improvement in R UE mobility and strength.    Time 6    Period Weeks    Status New                  Plan - 11/02/20 2505    Clinical Impression Statement Pt was demonstrated improved R shoulder ROM after STM and joint mobilization at beginning of today's session. Pt was then able to follow up with good understanding and technique and review of HEP with minor cuing with form and technique with supine protraction and standing rows. Rowing caused pinching but decreased when resistance was lowered. Pt was able to progress HEP to include more OKC exercise and AAROM to improve R UE mobility. Pt would benefit from continued skilled therapy in order reach goals and maximize functional R UE strenth and mobility for prevention of continued functional decline.    Personal Factors and Comorbidities Age    Examination-Activity Limitations Bathing;Dressing;Lift;Carry;Caring for Others    Examination-Participation Restrictions Other;Yard Work;Cleaning    Stability/Clinical Decision Making Stable/Uncomplicated    Rehab Potential Good    PT Frequency 2x / week    PT Duration 6 weeks    PT Treatment/Interventions ADLs/Self Care Home Management;Biofeedback;Cryotherapy;Electrical Stimulation;Iontophoresis 4mg /ml Dexamethasone;Moist Heat;Aquatic Therapy;Traction;Ultrasound;Functional mobility training;Therapeutic activities;Therapeutic exercise;Neuromuscular re-education;Cognitive remediation;Patient/family education;Manual techniques;Passive range of motion;Dry needling;Taping;Spinal Manipulations;Joint Manipulations    PT Next Visit Plan Review HEP, STM/joint mob, ABD iso, flexion iso    Consulted and Agree with Plan of Care Patient           Patient will benefit from skilled therapeutic intervention in order to improve the following deficits and impairments:  Decreased endurance,Decreased mobility,Increased muscle spasms,Impaired flexibility,Decreased strength,Decreased range of motion,Pain  Visit Diagnosis: Pain in joint of right shoulder  Decreased right shoulder range of motion  Muscle weakness  (generalized)     Problem List Patient Active Problem List   Diagnosis Date Noted  . Persistent atrial fibrillation (HCC) 09/27/2020  . Secondary hypercoagulable state (HCC) 09/27/2020  . Chronic right shoulder pain 06/12/2020  . Leg cramping 07/20/2018  . Frozen shoulder 06/01/2018  . Low back pain 02/11/2018  . Other bursal cyst of wrist, left 01/21/2017  . 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 Degenerative arthritis of cervical spine 12/04/2015  . Nonallopathic lesion of cervical region 12/04/2015  . Nonallopathic lesion of thoracic region 12/04/2015  . Nonallopathic lesion-rib cage 12/04/2015  . Hyperglycemia 06/16/2014  . Medicare annual wellness visit, subsequent 06/16/2014  .  Visit for preventive health examination 10/13/2011  . COUGH 10/12/2009  . COLONIC POLYPS, HX OF 10/12/2009  . SYMPTOM, INSOMNIA NOS 06/30/2007  . Hyperlipidemia 05/31/2007  . Osteopenia 05/31/2007  . NEPHROLITHIASIS, HX OF 05/31/2007    Zebedee Iba PT, DPT 11/02/20 9:52 AM   [facility] [address] [place], [place], [address] Phone: [phone]   Fax:  [phone]  Name: Nichole Berg MRN: [id number] Date of Birth: [birth year]

## 2020-11-02 NOTE — Patient Instructions (Signed)
Access Code: [email or web address] URL: [email or web address] Date: 11/02/2020 Prepared by: Zebedee Iba  Exercises Standing Shoulder Flexion Wall Walk - 2 x daily - 7 x weekly - 2 sets - 10 reps Standing Wall Ball Circles in Scaption with Mini Swiss Ball - 2 x daily - 7 x weekly - 2 sets - 10 reps

## 2020-11-05 ENCOUNTER — Other Ambulatory Visit: Payer: Self-pay

## 2020-11-05 ENCOUNTER — Encounter: Payer: Self-pay | Admitting: Physical Therapy

## 2020-11-05 ENCOUNTER — Ambulatory Visit: Payer: Medicare PPO | Admitting: Physical Therapy

## 2020-11-05 DIAGNOSIS — M25611 Stiffness of right shoulder, not elsewhere classified: Secondary | ICD-10-CM | POA: Diagnosis not present

## 2020-11-05 DIAGNOSIS — M25511 Pain in right shoulder: Secondary | ICD-10-CM | POA: Diagnosis not present

## 2020-11-05 DIAGNOSIS — M6281 Muscle weakness (generalized): Secondary | ICD-10-CM

## 2020-11-05 NOTE — Therapy (Signed)
[facility] [address] [place], [place], [address] Phone: [phone]   Fax:  [phone]  Physical Therapy Treatment  Patient Details  Name: Nichole Berg MRN: [id number] Date of Birth: [birth year] Referring Provider (PT): Dr. Antoine Primas   Encounter Date: 11/05/2020   PT End of Session - 11/05/20 0837    Visit Number 3    Number of Visits 13    Date for PT Re-Evaluation 11/29/20    Authorization Type [employer]    PT Start Time 0845    PT Stop Time 0930    PT Time Calculation (min) 45 min    Activity Tolerance Patient tolerated treatment well    Behavior During Therapy [facility] for tasks assessed/performed           Past Medical History:  Diagnosis Date  . Cataract 10/18/2011   Followed by opthalmology.   . COLONIC POLYPS, HX OF 10/12/2009  . COVID-19 03/2020  . HYPERLIPIDEMIA 05/31/2007  . NEPHROLITHIASIS, HX OF 05/31/2007    Past Surgical History:  Procedure Laterality Date  . CARDIOVERSION N/A 08/15/2020   Procedure: CARDIOVERSION;  Surgeon: Meriam Sprague, MD;  Location: [facility] ENDOSCOPY;  Service: Cardiovascular;  Laterality: N/A;  . CATARACT EXTRACTION, BILATERAL      There were no vitals filed for this visit.   Subjective Assessment - 11/05/20 0850    Subjective Pt states that the shoulder is not as sore as it was last session. She states she still feels a pinch when going into ER or lifting overhead.    Limitations Lifting;House hold activities    Diagnostic tests MRI, [place]    Patient Stated Goals Pt states she would lke to strengthen upper body without pain.    Currently in Pain? Yes    Pain Score 2     Pain Location Shoulder    Pain Orientation Right    Pain Descriptors / Indicators Aching;Sharp    Pain Type Chronic pain    Pain Onset More than a month ago                             [facility] Adult PT Treatment/Exercise - 11/05/20 0001      Exercises   Exercises Neck;Shoulder       Shoulder Exercises: Supine   Protraction Strengthening;AROM    Protraction Weight (lbs) --    Protraction Limitations --      Shoulder Exercises: Seated   Other Seated Exercises seated scapular depression 2s 10x      Shoulder Exercises: Standing   Row Theraband;10 reps    Theraband Level (Shoulder Row) Level 3 (Green)    Row Limitations 3x10    Shoulder Elevation AAROM    Shoulder Elevation Limitations wall wash/walk 2x10    Other Standing Exercises ER shoulder iso hold 3s 10x    Other Standing Exercises Wall ball CW an CCW 20x, wall push up 2x10      Shoulder Exercises: Stretch   Other Shoulder Stretches Doorway Pec at 60 deg 30s 3x      Manual Therapy   Manual Therapy Joint mobilization;Soft tissue mobilization    Joint Mobilization LAD grade II with inf grade II mob    Soft tissue mobilization R infra, deltoid, supra, UT      Neck Exercises: Stretches   Levator Stretch 3 reps;30 seconds  PT Education - 11/05/20 0836    Education Details joint protection strategies, acceptable level of pain, HEP, sleeping position    Person(s) Educated Patient    Methods Explanation;Demonstration;Tactile cues    Comprehension Verbalized understanding;Returned demonstration            PT Short Term Goals - 10/30/20 1213      PT SHORT TERM GOAL #1   Title Pt will demonstrate independence with HEP in order to demonstrate synthesis of PT and compliance education.    Time 2    Period Weeks    Status New      PT SHORT TERM GOAL #2   Title Pt will demonstrate ability to reach to end range without pain in order to demonstrate functional decreased in tissue sensitivity and functional improvement in ROM.    Time 4    Period Weeks    Status New             PT Long Term Goals - 10/30/20 1221      PT LONG TERM GOAL #1   Title Pt will demonstrate ability to perform lifting and carry >10 lbs overhead without pain in order to demonstrate functional improvement  in R UE mobility and strength.    Time 6    Period Weeks    Status New                 Plan - 11/05/20 1610    Clinical Impression Statement Pt presented with R periscapular muscle hypertonicity at beginning of today's session that was relieved with STM and joint mobilization. Pt demonstrated improved form and control of scapular movement as compared to previous session. Pt was able to incorportate CKC, scapular depression, as well as S/L ER without increased pain.  Pt would benefit from continued skilled therapy in order reach goals and maximize functional R UE strenth and mobility for prevention of continued functional decline.    Personal Factors and Comorbidities Age    Examination-Activity Limitations Bathing;Dressing;Lift;Carry;Caring for Others    Examination-Participation Restrictions Other;Yard Work;Cleaning    Stability/Clinical Decision Making Stable/Uncomplicated    Rehab Potential Good    PT Frequency 2x / week    PT Duration 6 weeks    PT Treatment/Interventions ADLs/Self Care Home Management;Biofeedback;Cryotherapy;Electrical Stimulation;Iontophoresis 4mg /ml Dexamethasone;Moist Heat;Aquatic Therapy;Traction;Ultrasound;Functional mobility training;Therapeutic activities;Therapeutic exercise;Neuromuscular re-education;Cognitive remediation;Patient/family education;Manual techniques;Passive range of motion;Dry needling;Taping;Spinal Manipulations;Joint Manipulations    PT Next Visit Plan Review HEP, STM/joint mob, prone extension, ABD, Y    Consulted and Agree with Plan of Care Patient           Patient will benefit from skilled therapeutic intervention in order to improve the following deficits and impairments:  Decreased endurance,Decreased mobility,Increased muscle spasms,Impaired flexibility,Decreased strength,Decreased range of motion,Pain  Visit Diagnosis: Pain in joint of right shoulder  Decreased right shoulder range of motion  Muscle weakness  (generalized)     Problem List Patient Active Problem List   Diagnosis Date Noted  . Persistent atrial fibrillation (HCC) 09/27/2020  . Secondary hypercoagulable state (HCC) 09/27/2020  . Chronic right shoulder pain 06/12/2020  . Leg cramping 07/20/2018  . Frozen shoulder 06/01/2018  . Low back pain 02/11/2018  . Other bursal cyst of wrist, left 01/21/2017  . 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 Degenerative arthritis of cervical spine 12/04/2015  . Nonallopathic lesion of cervical region 12/04/2015  . Nonallopathic lesion of thoracic region 12/04/2015  . Nonallopathic lesion-rib cage 12/04/2015  . Hyperglycemia 06/16/2014  . Medicare annual wellness visit,  subsequent 06/16/2014  . Visit for preventive health examination 10/13/2011  . COUGH 10/12/2009  . COLONIC POLYPS, HX OF 10/12/2009  . SYMPTOM, INSOMNIA NOS 06/30/2007  . Hyperlipidemia 05/31/2007  . Osteopenia 05/31/2007  . NEPHROLITHIASIS, HX OF 05/31/2007    Zebedee Iba PT, DPT 11/05/20 12:10 PM   [place] [facility] [address] [place], [place], [address] Phone: [phone]   Fax:  [phone]  Name: Thailyn Khalid MRN: [id number] Date of Birth: 16-Apr-1946

## 2020-11-05 NOTE — Patient Instructions (Signed)
Access Code: [email or web address] URL: [email or web address] Date: 11/05/2020 Prepared by: Zebedee Iba  Exercises Gentle Levator Scapulae Stretch - 2 x daily - 7 x weekly - 1 sets - 3 reps - 30 hold Doorway Pec Stretch at 60 Degrees Abduction with Arm Straight - 2 x daily - 7 x weekly - 1 sets - 3 reps - 30 hold

## 2020-11-06 ENCOUNTER — Ambulatory Visit: Payer: Medicare PPO | Admitting: Family Medicine

## 2020-11-12 ENCOUNTER — Encounter: Payer: Medicare PPO | Admitting: Physical Therapy

## 2020-11-13 ENCOUNTER — Ambulatory Visit (INDEPENDENT_AMBULATORY_CARE_PROVIDER_SITE_OTHER): Payer: Medicare PPO | Admitting: Physical Therapy

## 2020-11-13 ENCOUNTER — Other Ambulatory Visit: Payer: Self-pay

## 2020-11-13 ENCOUNTER — Encounter: Payer: Self-pay | Admitting: Physical Therapy

## 2020-11-13 DIAGNOSIS — M25511 Pain in right shoulder: Secondary | ICD-10-CM

## 2020-11-13 DIAGNOSIS — M25611 Stiffness of right shoulder, not elsewhere classified: Secondary | ICD-10-CM

## 2020-11-13 DIAGNOSIS — M6281 Muscle weakness (generalized): Secondary | ICD-10-CM | POA: Diagnosis not present

## 2020-11-13 NOTE — Therapy (Signed)
[facility] [address] [place], [place], [address] Phone: [phone]   Fax:  [phone]  Physical Therapy Treatment  Patient Details  Name: Nichole Berg MRN: [id number] Date of Birth: Oct 23, 1945 Referring Provider (PT): Dr. Antoine Primas   Encounter Date: 11/13/2020   PT End of Session - 11/13/20 0918    Visit Number 4    Number of Visits 13    Date for PT Re-Evaluation 11/29/20    Authorization Type [employer]    PT Start Time 0845    PT Stop Time 0930    PT Time Calculation (min) 45 min    Activity Tolerance Patient tolerated treatment well;Patient limited by pain    Behavior During Therapy [facility] for tasks assessed/performed           Past Medical History:  Diagnosis Date  . Cataract 10/18/2011   Followed by opthalmology.   . COLONIC POLYPS, HX OF 10/12/2009  . COVID-19 03/2020  . HYPERLIPIDEMIA 05/31/2007  . NEPHROLITHIASIS, HX OF 05/31/2007    Past Surgical History:  Procedure Laterality Date  . CARDIOVERSION N/A 08/15/2020   Procedure: CARDIOVERSION;  Surgeon: Meriam Sprague, MD;  Location: [facility] ENDOSCOPY;  Service: Cardiovascular;  Laterality: N/A;  . CATARACT EXTRACTION, BILATERAL      There were no vitals filed for this visit.   Subjective Assessment - 11/13/20 0850    Subjective Pt states the arm is sore from the weekend of prepping her camper. She states she still has pinching and pain but she is still able to perform her daily tasks.    Limitations Lifting;House hold activities    Diagnostic tests MRI, [place]    Patient Stated Goals Pt states she would lke to strengthen upper body without pain.    Currently in Pain? Yes    Pain Score 2     Pain Location Shoulder    Pain Orientation Right    Pain Descriptors / Indicators Aching;Sharp    Pain Onset More than a month ago                             [facility] Adult PT Treatment/Exercise - 11/13/20 0001      Exercises   Exercises  Neck;Shoulder      Neck Exercises: Standing   Other Standing Exercises horizontal ABD 2x10 yellow      Shoulder Exercises: Supine   Protraction Strengthening;AROM      Shoulder Exercises: Seated   Other Seated Exercises seated scapular depression 2s 10x      Shoulder Exercises: Standing   Row Theraband;10 reps    Theraband Level (Shoulder Row) Level 3 (Green)    Row Limitations 3x10    Shoulder Elevation AAROM    Shoulder Elevation Limitations wall wash/walk 2x10    Other Standing Exercises ER shoulder iso hold 3s 10x    Other Standing Exercises Wall ball CW an CCW 20x, wall push up 2x10      Shoulder Exercises: Stretch   Other Shoulder Stretches Doorway Pec at 60 deg 30s 3x    Other Shoulder Stretches cross body stretch 30s 3x      Manual Therapy   Manual Therapy Joint mobilization;Soft tissue mobilization    Joint Mobilization LAD grade II with inf grade II mob   155 flexion and ABD at start   Soft tissue mobilization R infra, deltoid, supra, UT      Neck Exercises: Stretches   Levator  Stretch 3 reps;30 seconds                  PT Education - 11/13/20 0917    Education Details joint protection strategies, acceptable level of pain, HEP frequency, thermotherapy    Person(s) Educated Patient    Methods Explanation;Demonstration;Tactile cues    Comprehension Verbalized understanding;Returned demonstration            PT Short Term Goals - 10/30/20 1213      PT SHORT TERM GOAL #1   Title Pt will demonstrate independence with HEP in order to demonstrate synthesis of PT and compliance education.    Time 2    Period Weeks    Status New      PT SHORT TERM GOAL #2   Title Pt will demonstrate ability to reach to end range without pain in order to demonstrate functional decreased in tissue sensitivity and functional improvement in ROM.    Time 4    Period Weeks    Status New             PT Long Term Goals - 10/30/20 1221      PT LONG TERM GOAL #1   Title  Pt will demonstrate ability to perform lifting and carry >10 lbs overhead without pain in order to demonstrate functional improvement in R UE mobility and strength.    Time 6    Period Weeks    Status New                 Plan - 11/13/20 0920    Clinical Impression Statement Pt presented with increased R shoulder muscle soreness and tension at today's session that was likely due to recent increase in physical activity over the weekend. Pt found relief with STM to anterior deltoid and mobilization with gentle movement. Pt required VC and TC for scapular positioning during exercise in order to reduce pain. Pt was able to reduce pinching and sharp pain once corrected for scapular tipping and proper shoulder setting prior to engagement. Pt's HEP was reduced to 3-4x week in order to promote better recovery between sessions and pt gave verbal understanding to thermotherapy as an adjunct to exercise. Pt likely able to progression to more open packed position strengthening at next session.  Pt would benefit from continued skilled therapy in order reach goals and maximize functional R UE strenth and mobility for prevention of continued functional decline.    Personal Factors and Comorbidities Age    Examination-Activity Limitations Bathing;Dressing;Lift;Carry;Caring for Others    Examination-Participation Restrictions Other;Yard Work;Cleaning    Stability/Clinical Decision Making Stable/Uncomplicated    Rehab Potential Good    PT Frequency 2x / week    PT Duration 6 weeks    PT Treatment/Interventions ADLs/Self Care Home Management;Biofeedback;Cryotherapy;Electrical Stimulation;Iontophoresis 4mg /ml Dexamethasone;Moist Heat;Aquatic Therapy;Traction;Ultrasound;Functional mobility training;Therapeutic activities;Therapeutic exercise;Neuromuscular re-education;Cognitive remediation;Patient/family education;Manual techniques;Passive range of motion;Dry needling;Taping;Spinal Manipulations;Joint Manipulations     PT Next Visit Plan Review HEP, STM/joint mob, prone extension, ABD, Y    Consulted and Agree with Plan of Care Patient           Patient will benefit from skilled therapeutic intervention in order to improve the following deficits and impairments:  Decreased endurance,Decreased mobility,Increased muscle spasms,Impaired flexibility,Decreased strength,Decreased range of motion,Pain  Visit Diagnosis: Pain in joint of right shoulder  Decreased right shoulder range of motion  Muscle weakness (generalized)     Problem List Patient Active Problem List   Diagnosis Date Noted  . Persistent atrial fibrillation (  HCC) 09/27/2020  . Secondary hypercoagulable state (HCC) 09/27/2020  . Chronic right shoulder pain 06/12/2020  . Leg cramping 07/20/2018  . Frozen shoulder 06/01/2018  . Low back pain 02/11/2018  . Other bursal cyst of wrist, left 01/21/2017  . 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 Degenerative arthritis of cervical spine 12/04/2015  . Nonallopathic lesion of cervical region 12/04/2015  . Nonallopathic lesion of thoracic region 12/04/2015  . Nonallopathic lesion-rib cage 12/04/2015  . Hyperglycemia 06/16/2014  . Medicare annual wellness visit, subsequent 06/16/2014  . Visit for preventive health examination 10/13/2011  . COUGH 10/12/2009  . COLONIC POLYPS, HX OF 10/12/2009  . SYMPTOM, INSOMNIA NOS 06/30/2007  . Hyperlipidemia 05/31/2007  . Osteopenia 05/31/2007  . NEPHROLITHIASIS, HX OF 05/31/2007   Zebedee Iba PT, DPT 11/13/20 10:06 AM   [place] [facility] [address] [place], [place], [address] Phone: [phone]   Fax:  [phone]  Name: Nichole Berg MRN: [id number] Date of Birth: [birth year]

## 2020-11-16 ENCOUNTER — Encounter: Payer: Medicare PPO | Admitting: Physical Therapy

## 2020-11-19 ENCOUNTER — Other Ambulatory Visit: Payer: Medicare PPO

## 2020-11-19 ENCOUNTER — Other Ambulatory Visit: Payer: Self-pay

## 2020-11-19 DIAGNOSIS — I10 Essential (primary) hypertension: Secondary | ICD-10-CM | POA: Diagnosis not present

## 2020-11-19 DIAGNOSIS — Z01812 Encounter for preprocedural laboratory examination: Secondary | ICD-10-CM

## 2020-11-19 DIAGNOSIS — I4819 Other persistent atrial fibrillation: Secondary | ICD-10-CM

## 2020-11-19 LAB — CBC WITH DIFFERENTIAL/PLATELET
Basophils Absolute: 0 10*3/uL (ref 0.0–0.2)
Basos: 1 %
EOS (ABSOLUTE): 0.1 10*3/uL (ref 0.0–0.4)
Eos: 1 %
Hematocrit: 37.6 % (ref 34.0–46.6)
Hemoglobin: 12.7 g/dL (ref 11.1–15.9)
Immature Grans (Abs): 0 10*3/uL (ref 0.0–0.1)
Immature Granulocytes: 0 %
Lymphocytes Absolute: 1 10*3/uL (ref 0.7–3.1)
Lymphs: 26 %
MCH: 31.4 pg (ref 26.6–33.0)
MCHC: 33.8 g/dL (ref 31.5–35.7)
MCV: 93 fL (ref 79–97)
Monocytes Absolute: 0.5 10*3/uL (ref 0.1–0.9)
Monocytes: 13 %
Neutrophils Absolute: 2.4 10*3/uL (ref 1.4–7.0)
Neutrophils: 59 %
Platelets: 220 10*3/uL (ref 150–450)
RBC: 4.04 x10E6/uL (ref 3.77–5.28)
RDW: 11.8 % (ref 11.7–15.4)
WBC: 4.1 10*3/uL (ref 3.4–10.8)

## 2020-11-19 LAB — BASIC METABOLIC PANEL
BUN/Creatinine Ratio: 20 (ref 12–28)
BUN: 15 mg/dL (ref 8–27)
CO2: 23 mmol/L (ref 20–29)
Calcium: 9.1 mg/dL (ref 8.7–10.3)
Chloride: 101 mmol/L (ref 96–106)
Creatinine, Ser: 0.76 mg/dL (ref 0.57–1.00)
Glucose: 86 mg/dL (ref 65–99)
Potassium: 4.8 mmol/L (ref 3.5–5.2)
Sodium: 138 mmol/L (ref 134–144)
eGFR: 81 mL/min/{1.73_m2} (ref >59)

## 2020-11-20 ENCOUNTER — Encounter: Payer: Medicare PPO | Admitting: Physical Therapy

## 2020-11-21 ENCOUNTER — Other Ambulatory Visit: Payer: Self-pay

## 2020-11-21 ENCOUNTER — Ambulatory Visit: Payer: Medicare PPO | Admitting: Physical Therapy

## 2020-11-21 ENCOUNTER — Encounter: Payer: Self-pay | Admitting: Physical Therapy

## 2020-11-21 DIAGNOSIS — M25611 Stiffness of right shoulder, not elsewhere classified: Secondary | ICD-10-CM

## 2020-11-21 DIAGNOSIS — M25511 Pain in right shoulder: Secondary | ICD-10-CM

## 2020-11-21 DIAGNOSIS — M6281 Muscle weakness (generalized): Secondary | ICD-10-CM

## 2020-11-21 NOTE — Therapy (Signed)
[facility] [address] [place], [place], [address] Phone: [phone]   Fax:  [phone]  Physical Therapy Treatment  Patient Details  Name: Nichole Berg MRN: [id number] Date of Birth: June 13, 1946 Referring Provider (PT): Dr. Antoine Primas   Encounter Date: 11/21/2020   PT End of Session - 11/21/20 2039    Visit Number 5    Number of Visits 13    Date for PT Re-Evaluation 11/29/20    Authorization Type [employer]    PT Start Time 1431    PT Stop Time 1515    PT Time Calculation (min) 44 min    Activity Tolerance Patient tolerated treatment well;Patient limited by pain    Behavior During Therapy [facility] for tasks assessed/performed           Past Medical History:  Diagnosis Date  . Cataract 10/18/2011   Followed by opthalmology.   . COLONIC POLYPS, HX OF 10/12/2009  . COVID-19 03/2020  . HYPERLIPIDEMIA 05/31/2007  . NEPHROLITHIASIS, HX OF 05/31/2007    Past Surgical History:  Procedure Laterality Date  . CARDIOVERSION N/A 08/15/2020   Procedure: CARDIOVERSION;  Surgeon: Meriam Sprague, MD;  Location: [facility] ENDOSCOPY;  Service: Cardiovascular;  Laterality: N/A;  . CATARACT EXTRACTION, BILATERAL      There were no vitals filed for this visit.   Subjective Assessment - 11/21/20 1439    Subjective Pt states the arm is doing better with decreased pain into the R shoulder. She states it will still catch with OH movements but she is able to keep "pushing through."    Limitations Lifting;House hold activities    Diagnostic tests MRI, [place]    Patient Stated Goals Pt states she would lke to strengthen upper body without pain.    Currently in Pain? No/denies    Pain Score 0-No pain    Pain Location Shoulder    Pain Orientation Right    Pain Descriptors / Indicators Aching;Sharp    Pain Type Chronic pain    Pain Onset More than a month ago    Aggravating Factors  reaching backwards, reaching OH, sleeping on it    Pain Relieving  Factors Voltaren gel, ice, rest                             [facility] Adult PT Treatment/Exercise - 11/21/20 0001      Exercises   Exercises Neck;Shoulder      Neck Exercises: Standing   Other Standing Exercises horizontal ABD 2x10 yellow      Shoulder Exercises: Supine   Protraction Strengthening;AROM      Shoulder Exercises: Seated   External Rotation 20 reps    Theraband Level (Shoulder External Rotation) Level 1 (Yellow)    External Rotation Limitations 2x10    Other Seated Exercises seated scapular depression 2s 10x                             Other Standing Exercises ER shoulder iso hold 3s 10x    Other Standing Exercises arnold press 2x10, shoulder ABD 2x10      Shoulder Exercises: Stretch   Other Shoulder Stretches Doorway Pec at 60 deg 30s 3x    Other Shoulder Stretches cross body stretch 30s 3x      Manual Therapy   Manual Therapy Joint mobilization;Soft tissue mobilization    Joint Mobilization LAD with inf grade III  mob   155 flexion and ABD at start   Soft tissue mobilization R infra   also taught self release     Neck Exercises: Stretches   Levator Stretch 3 reps;30 seconds                  PT Education - 11/21/20 2036    Education Details joint protection strategies, exercise progression, HEP frequency, POC    Person(s) Educated Patient    Methods Explanation;Demonstration;Tactile cues    Comprehension Verbalized understanding;Returned demonstration            PT Short Term Goals - 10/30/20 1213      PT SHORT TERM GOAL #1   Title Pt will demonstrate independence with HEP in order to demonstrate synthesis of PT and compliance education.    Time 2    Period Weeks    Status New      PT SHORT TERM GOAL #2   Title Pt will demonstrate ability to reach to end range without pain in order to demonstrate functional decreased in tissue sensitivity and functional improvement in ROM.    Time 4    Period Weeks    Status New              PT Long Term Goals - 10/30/20 1221      PT LONG TERM GOAL #1   Title Pt will demonstrate ability to perform lifting and carry >10 lbs overhead without pain in order to demonstrate functional improvement in R UE mobility and strength.    Time 6    Period Weeks    Status New                 Plan - 11/21/20 2040    Clinical Impression Statement Pt presents with increased soft tissue spasm/ triggerpoints in R infra. Responded well to [facility] and found ER was no longer painful after manual therapy. Pt was able to progress ER strengthening to increased resistance as well as AROM. Pt was also able to perform OH pressing and ABD movements without recreating pain. Plan to progress weights with OH at next session. Pt would benefit from continued skilled therapy in order reach goals and maximize functional R UE strenth and mobility for prevention of continued functional decline.    Personal Factors and Comorbidities Age    Examination-Activity Limitations Bathing;Dressing;Lift;Carry;Caring for Others    Examination-Participation Restrictions Other;Yard Work;Cleaning    Stability/Clinical Decision Making Stable/Uncomplicated    Rehab Potential Good    PT Frequency 2x / week    PT Duration 6 weeks    PT Treatment/Interventions ADLs/Self Care Home Management;Biofeedback;Cryotherapy;Electrical Stimulation;Iontophoresis 4mg /ml Dexamethasone;Moist Heat;Aquatic Therapy;Traction;Ultrasound;Functional mobility training;Therapeutic activities;Therapeutic exercise;Neuromuscular re-education;Cognitive remediation;Patient/family education;Manual techniques;Passive range of motion;Dry needling;Taping;Spinal Manipulations;Joint Manipulations    PT Next Visit Plan Review HEP, STM/joint mob, prone extension, prone Y, increase weight with OHP and ABD    Consulted and Agree with Plan of Care Patient           Patient will benefit from skilled therapeutic intervention in order to improve the following  deficits and impairments:  Decreased endurance,Decreased mobility,Increased muscle spasms,Impaired flexibility,Decreased strength,Decreased range of motion,Pain  Visit Diagnosis: Pain in joint of right shoulder  Decreased right shoulder range of motion  Muscle weakness (generalized)     Problem List Patient Active Problem List   Diagnosis Date Noted  . Persistent atrial fibrillation (HCC) 09/27/2020  . Secondary hypercoagulable state (HCC) 09/27/2020  . Chronic right shoulder pain 06/12/2020  . Leg  cramping 07/20/2018  . Frozen shoulder 06/01/2018  . Low back pain 02/11/2018  . Other bursal cyst of wrist, left 01/21/2017  . 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 Degenerative arthritis of cervical spine 12/04/2015  . Nonallopathic lesion of cervical region 12/04/2015  . Nonallopathic lesion of thoracic region 12/04/2015  . Nonallopathic lesion-rib cage 12/04/2015  . Hyperglycemia 06/16/2014  . Medicare annual wellness visit, subsequent 06/16/2014  . Visit for preventive health examination 10/13/2011  . COUGH 10/12/2009  . COLONIC POLYPS, HX OF 10/12/2009  . SYMPTOM, INSOMNIA NOS 06/30/2007  . Hyperlipidemia 05/31/2007  . Osteopenia 05/31/2007  . NEPHROLITHIASIS, HX OF 05/31/2007    Zebedee Iba 11/21/2020, 8:48 PM  [facility] [address] [place], [place], [address] Phone: [phone]   Fax:  [phone]  Name: Nichole Berg MRN: [id number] Date of Birth: 20-May-1946

## 2020-11-21 NOTE — Patient Instructions (Signed)
Access Code: [email or web address] URL: [email or web address] Date: 11/21/2020 Prepared by: Zebedee Iba  Exercises Shoulder Abduction with Dumbbells - Thumbs Up - 1 x daily - 3-4 x weekly - 3 sets - 10 reps Shoulder External Rotation and Scapular Retraction with Resistance - 1 x daily - 3-4 x weekly - 3 sets - 10 reps

## 2020-11-29 DIAGNOSIS — M5411 Radiculopathy, occipito-atlanto-axial region: Secondary | ICD-10-CM | POA: Diagnosis not present

## 2020-11-29 DIAGNOSIS — M9901 Segmental and somatic dysfunction of cervical region: Secondary | ICD-10-CM | POA: Diagnosis not present

## 2020-11-30 ENCOUNTER — Encounter: Payer: Self-pay | Admitting: Physical Therapy

## 2020-11-30 ENCOUNTER — Other Ambulatory Visit: Payer: Self-pay

## 2020-11-30 ENCOUNTER — Telehealth (HOSPITAL_COMMUNITY): Payer: Self-pay | Admitting: Emergency Medicine

## 2020-11-30 ENCOUNTER — Ambulatory Visit: Payer: Medicare PPO | Admitting: Physical Therapy

## 2020-11-30 DIAGNOSIS — M25511 Pain in right shoulder: Secondary | ICD-10-CM | POA: Diagnosis not present

## 2020-11-30 DIAGNOSIS — M6281 Muscle weakness (generalized): Secondary | ICD-10-CM

## 2020-11-30 DIAGNOSIS — M25611 Stiffness of right shoulder, not elsewhere classified: Secondary | ICD-10-CM | POA: Diagnosis not present

## 2020-11-30 NOTE — Therapy (Addendum)
[facility] [address] [place], [place], [address] Phone: [phone]   Fax:  [phone]  Physical Therapy Discharge  Patient Details  Name: Nichole Berg MRN: [id number] Date of Birth: 10/09/1945 Referring Provider (PT): Dr. Hulan Saas   Encounter Date: 11/30/2020   PT End of Session - 11/30/20 1206    Visit Number 6    Number of Visits 13    Date for PT Re-Evaluation 12/30/20    Authorization Type [employer]    PT Start Time 1100    PT Stop Time 1145    PT Time Calculation (min) 45 min    Activity Tolerance Patient tolerated treatment well;Patient limited by pain    Behavior During Therapy [facility] for tasks assessed/performed           Past Medical History:  Diagnosis Date  . Cataract 10/18/2011   Followed by opthalmology.   . COLONIC POLYPS, HX OF 10/12/2009  . COVID-19 03/2020  . HYPERLIPIDEMIA 05/31/2007  . NEPHROLITHIASIS, HX OF 05/31/2007    Past Surgical History:  Procedure Laterality Date  . CARDIOVERSION N/A 08/15/2020   Procedure: CARDIOVERSION;  Surgeon: Freada Bergeron, MD;  Location: [facility] ENDOSCOPY;  Service: Cardiovascular;  Laterality: N/A;  . CATARACT EXTRACTION, BILATERAL      There were no vitals filed for this visit.   Subjective Assessment - 11/30/20 1108    Subjective Pt states that the shoulder was very sore after last session and had to take an Aleve due to the pain. She states the muscle was sore for a few hours. She thinks it could have been preparing for her camping trip right after therapy. She has no complaints of pain with today's session. The pinching does occur with certain positions still but she thinks she is getting stronger and better than compared to initial session.    Limitations Lifting;House hold activities    Diagnostic tests MRI, [place]    Patient Stated Goals Pt states she would lke to strengthen upper body without pain.    Currently in Pain? No/denies    Pain Score 0-No pain     Pain Location Shoulder    Pain Orientation Right    Pain Descriptors / Indicators Aching;Sharp    Pain Type Chronic pain    Pain Onset More than a month ago    Pain Frequency Intermittent    Aggravating Factors  resisted movements OH    Pain Relieving Factors Voltaren gel, Aleve, ice, rest              [facility] PT Assessment - 11/30/20 0001      Assessment   Medical Diagnosis R RTC Pain    Referring Provider (PT) Dr. Hulan Saas    Hand Dominance Right    Prior Therapy Previous R shoulder      Precautions   Precautions None      Restrictions   Weight Bearing Restrictions No      Home Environment   Living Environment Private residence      Prior Function   Level of Independence Independent      Cognition   Overall Cognitive Status Within Functional Limits for tasks assessed      Observation/Other Assessments   Other Surveys  Quick Dash    Quick DASH  10% Impairment      Sensation   Light Touch Appears Intact      AROM   Overall AROM Comments no pain at end range    Right  Shoulder Flexion 163 Degrees    Right Shoulder ABduction 171 Degrees    Right Shoulder Internal Rotation 65 Degrees    Right Shoulder External Rotation 47 Degrees      PROM   Overall PROM  Within functional limits for tasks performed      Strength   Overall Strength Deficits    Overall Strength Comments 4+/5 flexion, ER, and ABD (p!),  5/5 IR      Flexibility   Soft Tissue Assessment /Muscle Length yes      Palpation   Palpation comment Hypertonicity and TTP of R infra                                     [facility] Adult PT Treatment/Exercise - 11/30/20 0001      Exercises   Exercises Neck;Shoulder      Neck Exercises: Standing   Other Standing Exercises horizontal ABD 2x10 yellow    Other Standing Exercises counter push ups 2x10      Shoulder Exercises: Seated   External Rotation 20 reps    Theraband Level (Shoulder External Rotation) Level 1 (Yellow)     External Rotation Limitations 2x10          Shoulder Exercises: Standing   Shoulder Elevation Limitations --   discussed for home   Other Standing Exercises bilat shoulder ER yellow band 2x10    Other Standing Exercises arnold press 2x10, shoulder ABD 2x10 1lb      Manual Therapy   Manual Therapy Joint mobilization;Soft tissue mobilization    Joint Mobilization LAD with inf grade III mob    Soft tissue mobilization R infra      Neck Exercises: Stretches   Upper Trapezius Stretch 3 reps;30 seconds    Levator Stretch 3 reps;30 seconds                  PT Education - 11/30/20 1206    Education Details DN PRN, exercise progression, HEP frequency, POC, rest/recovery    Person(s) Educated Patient    Methods Explanation;Demonstration;Tactile cues;Verbal cues    Comprehension Verbalized understanding;Returned demonstration            PT Short Term Goals - 11/30/20 1220      PT SHORT TERM GOAL #1   Title Pt will demonstrate independence with HEP in order to demonstrate synthesis of PT and compliance education.    Time 2    Period Weeks    Status Achieved      PT SHORT TERM GOAL #2   Title Pt will demonstrate ability to reach to end range without pain in order to demonstrate functional decreased in tissue sensitivity and functional improvement in ROM.    Time 4    Period Weeks    Status Achieved             PT Long Term Goals - 11/30/20 1220      PT LONG TERM GOAL #1   Title Pt will demonstrate ability to perform lifting and carry >10 lbs overhead without pain in order to demonstrate functional improvement in R UE mobility and strength.    Time 6    Period Weeks    Status On-going                 Plan - 11/30/20 1215    Clinical Impression Statement Pt demonstrates signficant improvement in R shoulder function as demonstrated  by objective measures and functional movements. Pt continues to have R UE weakness with flexion and ABD as well as pain with ABD  MMT, but is stronger with OH lifting, ER resistance, and pressing/rowing movements. Pt is able to perform CKC exercise and resisted isolation exercise without significantly increased pain. Pt continues to have deficits that limit her goals and daily function due to pain. Pt would benefit from continued skilled therapy in order reach goals and maximize functional R UE strenth and mobility for prevention of continued functional decline. Pt to have ablative procedure performed within the next two weeks and return to therapy following as able.    Personal Factors and Comorbidities Age    Examination-Activity Limitations Bathing;Dressing;Lift;Carry;Caring for Others    Examination-Participation Restrictions Other;Yard Work;Cleaning    Stability/Clinical Decision Making Stable/Uncomplicated    Rehab Potential Good    PT Frequency 2x / week    PT Duration 6 weeks    PT Treatment/Interventions ADLs/Self Care Home Management;Biofeedback;Cryotherapy;Electrical Stimulation;Iontophoresis 36m/ml Dexamethasone;Moist Heat;Aquatic Therapy;Traction;Ultrasound;Functional mobility training;Therapeutic activities;Therapeutic exercise;Neuromuscular re-education;Cognitive remediation;Patient/family education;Manual techniques;Passive range of motion;Dry needling;Taping;Spinal Manipulations;Joint Manipulations    PT Next Visit Plan Review HEP, prone extension, prone Y, increase weight with OHP    PT Home Exercise Plan [email or web address]   Consulted and Agree with Plan of Care Patient           Patient will benefit from skilled therapeutic intervention in order to improve the following deficits and impairments:  Decreased endurance,Decreased mobility,Increased muscle spasms,Impaired flexibility,Decreased strength,Decreased range of motion,Pain  Visit Diagnosis: Pain in joint of right shoulder  Decreased right shoulder range of motion  Muscle weakness (generalized)     Problem List Patient Active Problem List    Diagnosis Date Noted  . Persistent atrial fibrillation ([place] 09/27/2020  . Secondary hypercoagulable state ([place] 09/27/2020  . Chronic right shoulder pain 06/12/2020  . Leg cramping 07/20/2018  . Frozen shoulder 06/01/2018  . Low back pain 02/11/2018  . Other bursal cyst of wrist, left 01/21/2017  . 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 Degenerative arthritis of cervical spine 12/04/2015  . Nonallopathic lesion of cervical region 12/04/2015  . Nonallopathic lesion of thoracic region 12/04/2015  . Nonallopathic lesion-rib cage 12/04/2015  . Hyperglycemia 06/16/2014  . Medicare annual wellness visit, subsequent 06/16/2014  . Visit for preventive health examination 10/13/2011  . COUGH 10/12/2009  . COLONIC POLYPS, HX OF 10/12/2009  . SYMPTOM, INSOMNIA NOS 06/30/2007  . Hyperlipidemia 05/31/2007  . Osteopenia 05/31/2007  . NEPHROLITHIASIS, HX OF 05/31/2007   ADaleen BoPT, DPT 11/30/20 12:23 PM   [place] [address]Phone: [phone]  Fax:  [phone] Name: CCarlyn LemkeMRN: [id number] of Birth: [birth year]  PHYSICAL THERAPY DISCHARGE SUMMARY  Visits from Start of Care: 6  Plan: Patient agrees to discharge.  Patient goals were not met. Patient is being discharged due to not returning since the last visit.  ?????

## 2020-11-30 NOTE — Telephone Encounter (Signed)
Reaching out to patient to offer assistance regarding upcoming cardiac imaging study; pt verbalizes understanding of appt date/time, parking situation and where to check in, pre-test NPO status and medications ordered, and verified current allergies; name and call back number provided for further questions should they arise Rockwell Alexandria RN Navigator Cardiac Imaging [facility] [phone] office [phone] cell   Holding succinate and taking tartrate 2 hr prior to scan [place]

## 2020-12-03 ENCOUNTER — Ambulatory Visit (HOSPITAL_COMMUNITY)
Admission: RE | Admit: 2020-12-03 | Discharge: 2020-12-03 | Disposition: A | Discharge status: Home / Self Care | Payer: Medicare PPO | Source: Ambulatory Visit | Attending: Cardiology | Admitting: Cardiology

## 2020-12-03 ENCOUNTER — Other Ambulatory Visit: Payer: Self-pay

## 2020-12-03 DIAGNOSIS — I4819 Other persistent atrial fibrillation: Secondary | ICD-10-CM | POA: Insufficient documentation

## 2020-12-03 MED ORDER — IOHEXOL 350 MG/ML SOLN
80.0000 mL | Freq: Once | INTRAVENOUS | Status: AC | PRN
  Administered 2020-12-03: 80 mL via INTRAVENOUS

## 2020-12-06 NOTE — Telephone Encounter (Signed)
Hi Nichole Berg  Sorry you have to have another procedure   But  People usually do well.   There is a benefit to the booster  In  People with higher risk  ( age  77 and over etc)  But cannot tell you how much benefit .  I would advise getting the  Booster at least 6 months after last shot or infection  Which is in  your case   ( skeptical and not convinced about the second booster ..data is very squishy.Marland Kitchen and why give the same vaccine un adapted to variants?Marland Kitchen)   Good luck with the procedure  . You have a  good medical team..Marland Kitchen

## 2020-12-07 ENCOUNTER — Other Ambulatory Visit (HOSPITAL_COMMUNITY)
Admission: RE | Admit: 2020-12-07 | Discharge: 2020-12-07 | Disposition: A | Discharge status: Home / Self Care | Payer: Medicare PPO | Source: Ambulatory Visit | Attending: Cardiology | Admitting: Cardiology

## 2020-12-07 DIAGNOSIS — Z20822 Contact with and (suspected) exposure to covid-19: Secondary | ICD-10-CM | POA: Insufficient documentation

## 2020-12-07 DIAGNOSIS — Z01812 Encounter for preprocedural laboratory examination: Secondary | ICD-10-CM | POA: Diagnosis not present

## 2020-12-08 LAB — SARS CORONAVIRUS 2 (TAT 6-24 HRS): SARS Coronavirus 2: NEGATIVE

## 2020-12-10 ENCOUNTER — Encounter (HOSPITAL_COMMUNITY)
Admission: RE | Disposition: A | Discharge status: Home / Self Care | Payer: Medicare PPO | Source: Home / Self Care | Attending: Cardiology

## 2020-12-10 ENCOUNTER — Encounter (HOSPITAL_COMMUNITY): Payer: Self-pay | Admitting: Cardiology

## 2020-12-10 ENCOUNTER — Ambulatory Visit (HOSPITAL_COMMUNITY): Payer: Medicare PPO | Admitting: Anesthesiology

## 2020-12-10 ENCOUNTER — Other Ambulatory Visit: Payer: Self-pay

## 2020-12-10 ENCOUNTER — Ambulatory Visit (HOSPITAL_COMMUNITY)
Admission: RE | Admit: 2020-12-10 | Discharge: 2020-12-10 | Disposition: A | Discharge status: Home / Self Care | Payer: Medicare PPO | Attending: Cardiology | Admitting: Cardiology

## 2020-12-10 DIAGNOSIS — Z8249 Family history of ischemic heart disease and other diseases of the circulatory system: Secondary | ICD-10-CM | POA: Insufficient documentation

## 2020-12-10 DIAGNOSIS — I4819 Other persistent atrial fibrillation: Secondary | ICD-10-CM | POA: Diagnosis not present

## 2020-12-10 DIAGNOSIS — E785 Hyperlipidemia, unspecified: Secondary | ICD-10-CM | POA: Insufficient documentation

## 2020-12-10 DIAGNOSIS — Z87442 Personal history of urinary calculi: Secondary | ICD-10-CM | POA: Diagnosis not present

## 2020-12-10 DIAGNOSIS — I10 Essential (primary) hypertension: Secondary | ICD-10-CM | POA: Insufficient documentation

## 2020-12-10 DIAGNOSIS — Z7901 Long term (current) use of anticoagulants: Secondary | ICD-10-CM | POA: Diagnosis not present

## 2020-12-10 DIAGNOSIS — Z79899 Other long term (current) drug therapy: Secondary | ICD-10-CM | POA: Insufficient documentation

## 2020-12-10 DIAGNOSIS — Z87891 Personal history of nicotine dependence: Secondary | ICD-10-CM | POA: Insufficient documentation

## 2020-12-10 DIAGNOSIS — Z882 Allergy status to sulfonamides status: Secondary | ICD-10-CM | POA: Diagnosis not present

## 2020-12-10 DIAGNOSIS — R739 Hyperglycemia, unspecified: Secondary | ICD-10-CM | POA: Diagnosis not present

## 2020-12-10 DIAGNOSIS — M47812 Spondylosis without myelopathy or radiculopathy, cervical region: Secondary | ICD-10-CM | POA: Diagnosis not present

## 2020-12-10 DIAGNOSIS — Z8616 Personal history of COVID-19: Secondary | ICD-10-CM | POA: Insufficient documentation

## 2020-12-10 HISTORY — PX: ATRIAL FIBRILLATION ABLATION: EP1191

## 2020-12-10 LAB — POCT ACTIVATED CLOTTING TIME
Activated Clotting Time: 327 seconds
Activated Clotting Time: 333 seconds
Activated Clotting Time: 356 seconds

## 2020-12-10 SURGERY — ATRIAL FIBRILLATION ABLATION
Anesthesia: General

## 2020-12-10 MED ORDER — PROTAMINE SULFATE 10 MG/ML IV SOLN
INTRAVENOUS | Status: DC | PRN
  Administered 2020-12-10 (×3): 10 mg via INTRAVENOUS

## 2020-12-10 MED ORDER — PANTOPRAZOLE SODIUM 40 MG PO TBEC
40.0000 mg | DELAYED_RELEASE_TABLET | Freq: Every day | ORAL | Status: DC
  Administered 2020-12-10: 40 mg via ORAL
  Filled 2020-12-10: qty 1

## 2020-12-10 MED ORDER — SODIUM CHLORIDE 0.9% FLUSH: 3.0000 mL | INTRAVENOUS | Status: DC | PRN

## 2020-12-10 MED ORDER — ONDANSETRON HCL 4 MG/2ML IJ SOLN: 4.0000 mg | Freq: Four times a day (QID) | INTRAMUSCULAR | Status: DC | PRN

## 2020-12-10 MED ORDER — ONDANSETRON HCL 4 MG/2ML IJ SOLN
INTRAMUSCULAR | Status: DC | PRN
  Administered 2020-12-10: 4 mg via INTRAVENOUS

## 2020-12-10 MED ORDER — SODIUM CHLORIDE 0.9 % IV SOLN: 250.0000 mL | INTRAVENOUS | Status: DC | PRN

## 2020-12-10 MED ORDER — ISOPROTERENOL HCL 0.2 MG/ML IJ SOLN
INTRAMUSCULAR | Status: AC
  Filled 2020-12-10: qty 5

## 2020-12-10 MED ORDER — SODIUM CHLORIDE 0.9 % IV SOLN: INTRAVENOUS | Status: DC

## 2020-12-10 MED ORDER — HEPARIN SODIUM (PORCINE) 1000 UNIT/ML IJ SOLN
INTRAMUSCULAR | Status: DC | PRN
  Administered 2020-12-10: 11000 [IU] via INTRAVENOUS
  Administered 2020-12-10 (×2): 2000 [IU] via INTRAVENOUS

## 2020-12-10 MED ORDER — LIDOCAINE 2% (20 MG/ML) 5 ML SYRINGE
INTRAMUSCULAR | Status: DC | PRN
  Administered 2020-12-10: 60 mg via INTRAVENOUS

## 2020-12-10 MED ORDER — APIXABAN 5 MG PO TABS
5.0000 mg | ORAL_TABLET | Freq: Two times a day (BID) | ORAL | Status: DC
  Administered 2020-12-10: 5 mg via ORAL
  Filled 2020-12-10: qty 1

## 2020-12-10 MED ORDER — METOPROLOL SUCCINATE ER 50 MG PO TB24
50.0000 mg | ORAL_TABLET | Freq: Every day | ORAL | Status: DC
  Administered 2020-12-10: 50 mg via ORAL
  Filled 2020-12-10: qty 1

## 2020-12-10 MED ORDER — PANTOPRAZOLE SODIUM 40 MG PO TBEC: 40.0000 mg | DELAYED_RELEASE_TABLET | Freq: Every day | ORAL | 0 refills | Status: DC

## 2020-12-10 MED ORDER — ACETAMINOPHEN 325 MG PO TABS
650.0000 mg | ORAL_TABLET | ORAL | Status: DC | PRN
  Filled 2020-12-10: qty 2

## 2020-12-10 MED ORDER — HEPARIN (PORCINE) IN NACL 1000-0.9 UT/500ML-% IV SOLN
INTRAVENOUS | Status: DC | PRN
  Administered 2020-12-10 (×2): 500 mL

## 2020-12-10 MED ORDER — PROPOFOL 10 MG/ML IV BOLUS
INTRAVENOUS | Status: DC | PRN
  Administered 2020-12-10: 100 mg via INTRAVENOUS

## 2020-12-10 MED ORDER — HEPARIN (PORCINE) IN NACL 1000-0.9 UT/500ML-% IV SOLN
INTRAVENOUS | Status: AC
  Filled 2020-12-10: qty 1000

## 2020-12-10 MED ORDER — ROCURONIUM BROMIDE 10 MG/ML (PF) SYRINGE
PREFILLED_SYRINGE | INTRAVENOUS | Status: DC | PRN
  Administered 2020-12-10: 10 mg via INTRAVENOUS
  Administered 2020-12-10: 40 mg via INTRAVENOUS

## 2020-12-10 MED ORDER — HEPARIN SODIUM (PORCINE) 1000 UNIT/ML IJ SOLN
INTRAMUSCULAR | Status: AC
  Filled 2020-12-10: qty 1

## 2020-12-10 MED ORDER — PHENYLEPHRINE 40 MCG/ML (10ML) SYRINGE FOR IV PUSH (FOR BLOOD PRESSURE SUPPORT)
PREFILLED_SYRINGE | INTRAVENOUS | Status: DC | PRN
  Administered 2020-12-10 (×2): 80 ug via INTRAVENOUS
  Administered 2020-12-10: 120 ug via INTRAVENOUS
  Administered 2020-12-10: 40 ug via INTRAVENOUS
  Administered 2020-12-10: 80 ug via INTRAVENOUS

## 2020-12-10 MED ORDER — SODIUM CHLORIDE 0.9% FLUSH: 3.0000 mL | Freq: Two times a day (BID) | INTRAVENOUS | Status: DC

## 2020-12-10 MED ORDER — DEXAMETHASONE SODIUM PHOSPHATE 10 MG/ML IJ SOLN
INTRAMUSCULAR | Status: DC | PRN
  Administered 2020-12-10: 8 mg via INTRAVENOUS

## 2020-12-10 MED ORDER — SUGAMMADEX SODIUM 200 MG/2ML IV SOLN
INTRAVENOUS | Status: DC | PRN
  Administered 2020-12-10: 120 mg via INTRAVENOUS

## 2020-12-10 MED ORDER — ISOPROTERENOL HCL 0.2 MG/ML IJ SOLN
INTRAVENOUS | Status: DC | PRN
  Administered 2020-12-10: 2 ug/min via INTRAVENOUS

## 2020-12-10 MED ORDER — HEPARIN SODIUM (PORCINE) 1000 UNIT/ML IJ SOLN
INTRAMUSCULAR | Status: DC | PRN
  Administered 2020-12-10: 1000 [IU] via INTRAVENOUS

## 2020-12-10 MED ORDER — FENTANYL CITRATE (PF) 100 MCG/2ML IJ SOLN
INTRAMUSCULAR | Status: DC | PRN
  Administered 2020-12-10: 50 ug via INTRAVENOUS

## 2020-12-10 SURGICAL SUPPLY — 19 items
BLANKET WARM UNDERBOD FULL ACC (MISCELLANEOUS) ×2 IMPLANT
CATH 8FR REPROCESSED SOUNDSTAR (CATHETERS) ×2 IMPLANT
CATH MAPPNG PENTARAY F 2-6-2MM (CATHETERS) ×1 IMPLANT
CATH S CIRCA THERM PROBE 10F (CATHETERS) ×2 IMPLANT
CATH SMTCH THERMOCOOL SF DF (CATHETERS) ×2 IMPLANT
CATH WEB BI DIR CSDF CRV REPRO (CATHETERS) ×2 IMPLANT
CLOSURE PERCLOSE PROSTYLE (VASCULAR PRODUCTS) ×6 IMPLANT
COVER SWIFTLINK CONNECTOR (BAG) ×2 IMPLANT
PACK EP LATEX FREE (CUSTOM PROCEDURE TRAY) ×2
PACK EP LF (CUSTOM PROCEDURE TRAY) ×1 IMPLANT
PAD PRO RADIOLUCENT 2001M-C (PAD) ×2 IMPLANT
PATCH CARTO3 (PAD) ×2 IMPLANT
PENTARAY F 2-6-2MM (CATHETERS) ×2
SHEATH BAYLIS TRANSSEPTAL 98CM (NEEDLE) ×2 IMPLANT
SHEATH CARTO VIZIGO SM CVD (SHEATH) ×2 IMPLANT
SHEATH PINNACLE 8F 10CM (SHEATH) ×4 IMPLANT
SHEATH PINNACLE 9F 10CM (SHEATH) ×2 IMPLANT
SHEATH PROBE COVER 6X72 (BAG) ×2 IMPLANT
TUBING SMART ABLATE COOLFLOW (TUBING) ×2 IMPLANT

## 2020-12-10 NOTE — H&P (Signed)
Electrophysiology Office Note:    Date:  10/24/2020   ID:  Nichole Berg, DOB 1945-10-21, MRN [id number]  PCP:  Madelin Headings, MD        [facility] Cardiologist:  Donato Schultz, MD  [facility] Electrophysiologist:  Lanier Prude, MD   Referring MD: Danice Goltz, PA   Chief Complaint: Symptomatic persistent atrial fibrillation  History of Present Illness:    Nichole Berg is a 75 y.o. female who presents for an evaluation of symptomatic persistent atrial fibrillation at the request of Jorja Loa, PA-C. Their medical history includes hyperlipidemia.  She was diagnosed with atrial fibrillation in October 2021 after she presented to an urgent care with dizziness and palpitations.  She underwent a cardioversion on August 15, 2020 but she had early return to atrial fibrillation approximately 3 weeks later.  She is in rate controlled atrial fibrillation on her EKG today.  She tells me that she does feel palpitations but denies presyncope or syncope.  She does not describe any shortness of breath associated with her A. fib.  She is tolerating her Eliquis without any bleeding issues.  She is a retired Publishing rights manager.  She previously worked at [employer].  She is not interested in using medications to help control her arrhythmias and would like to discuss ablative therapy.        Past Medical History:  Diagnosis Date  . Cataract 10/18/2011   Followed by opthalmology.   . COLONIC POLYPS, HX OF 10/12/2009  . COVID-19 03/2020  . HYPERLIPIDEMIA 05/31/2007  . NEPHROLITHIASIS, HX OF 05/31/2007         Past Surgical History:  Procedure Laterality Date  . CARDIOVERSION N/A 08/15/2020   Procedure: CARDIOVERSION;  Surgeon: Meriam Sprague, MD;  Location: [facility] ENDOSCOPY;  Service: Cardiovascular;  Laterality: N/A;  . CATARACT EXTRACTION, BILATERAL      Current Medications: Active Medications      Current Meds  Medication Sig  . apixaban (ELIQUIS) 5 MG  TABS tablet Take 1 tablet (5 mg total) by mouth 2 (two) times daily.  . Artificial Tear Ointment (DRY EYES OP) Apply 1 drop to eye daily as needed (dry eyes).  . Ascorbic Acid (VITAMIN C) 1000 MG tablet Take 1,000 mg by mouth daily.  . Cholecalciferol (VITAMIN D) 2000 UNITS tablet Take 2,000 Units by mouth daily.  . diclofenac Sodium (VOLTAREN) 1 % GEL Apply 1 application topically 4 (four) times daily as needed (pain.).   Marland Kitchen gabapentin (NEURONTIN) 100 MG capsule Take 1 capsule (100 mg total) by mouth at bedtime.  . LUTEIN PO Take 1 tablet by mouth daily.  . melatonin 5 MG TABS Take 5 mg by mouth at bedtime as needed (sleep.).   Marland Kitchen metoprolol succinate (TOPROL-XL) 50 MG 24 hr tablet Take 1 tablet (50 mg total) by mouth daily. Take with or immediately following a meal.       Allergies:   Sulfonamide derivatives   Social History        Socioeconomic History  . Marital status: Single    Spouse name: Not on file  . Number of children: Not on file  . Years of education: Not on file  . Highest education level: Not on file  Occupational History  . Occupation: Retired  Tobacco Use  . Smoking status: Former Smoker    Years: 2.00    Types: Cigarettes    Quit date: 09/08/1964    Years since quitting: 56.1  . Smokeless tobacco: Never  Used  Vaping Use  . Vaping Use: Never used  Substance and Sexual Activity  . Alcohol use: Yes    Comment: occassional  . Drug use: No  . Sexual activity: Not on file  Other Topics Concern  . 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 Difficulty of Paying Living Expenses: Not hard at all  Food Insecurity: No Food Insecurity  . Worried About Programme researcher, broadcasting/film/video in the Last Year: Never true  . Ran Out of Food in the Last Year: Never true   Transportation Needs: No Transportation Needs  . Lack of Transportation (Medical): No  . Lack of Transportation (Non-Medical): No  Physical Activity: Sufficiently Active  . Days of Exercise per Week: 7 days  . Minutes of Exercise per Session: 90 min  Stress: No Stress Concern Present  . Feeling of Stress : Only a little  Social Connections: Moderately Isolated  . Frequency of Communication with Friends and Family: More than three times a week  . Frequency of Social Gatherings with Friends and Family: Once a week  . Attends Religious Services: Never  . Active Member of Clubs or Organizations: Yes  . Attends Banker Meetings: 1 to 4 times per year  . Marital Status: Never married     Family History: The patient's family history includes Atrial fibrillation in her mother.  ROS:   Please see the history of present illness.    All other systems reviewed and are negative.  EKGs/Labs/Other Studies Reviewed:    The following studies were reviewed today:  August 10, 2020 echo personally reviewed Left ventricular function normal, 60% Normal right ventricular function Moderately dilated left atrium Mild mitral regurgitation  EKG:  The ekg ordered today demonstrates atrial fibrillation with a ventricular rate of 85 bpm.  Her QTC is 447 ms and a QRS duration of 88 ms  Recent Labs: 07/19/2020: TSH 1.410 08/10/2020: BUN 13; Creatinine, Ser 0.79; Hemoglobin 12.1; Platelets 244; Potassium 4.2; Sodium 140  Recent Lipid Panel Labs (Brief)          Component Value Date/Time   CHOL 256 (H) 10/17/2019 1058   TRIG 86.0 10/17/2019 1058   HDL 71.20 10/17/2019 1058   CHOLHDL 4 10/17/2019 1058   VLDL 17.2 10/17/2019 1058   LDLCALC 167 (H) 10/17/2019 1058   LDLDIRECT 160.3 10/13/2011 1058      Physical Exam:    VS:  BP 100/68   Pulse 85   Ht 5\' 4"  (1.626 m)   Wt 137 lb 6.4 oz (62.3 kg)   SpO2 98%   BMI 23.58 kg/m        Wt Readings from Last 3  Encounters:  10/16/20 137 lb 6.4 oz (62.3 kg)  09/27/20 140 lb 12.8 oz (63.9 kg)  09/17/20 141 lb (64 kg)     GEN: Well nourished, well developed in no acute distress.  Appears younger than stated age. HEENT: Normal NECK: No JVD; No carotid bruits LYMPHATICS: No lymphadenopathy CARDIAC: Irregularly irregular, no murmurs, rubs, gallops RESPIRATORY:  Clear to auscultation without rales, wheezing or rhonchi  ABDOMEN: Soft, non-tender, non-distended MUSCULOSKELETAL:  No edema; No deformity  SKIN: Warm and dry NEUROLOGIC:  Alert and  oriented x 3 PSYCHIATRIC:  Normal affect   ASSESSMENT:    1. Persistent atrial fibrillation (HCC)   2. Essential hypertension   3. Pre-procedure lab exam    PLAN:    In order of problems listed above:  1. Symptomatic persistent atrial fibrillation Patient has relatively recent onset persistent symptomatic atrial fibrillation.  She is tolerating anticoagulation with eliquis.  I discussed the treatment options for her atrial arrhythmia including continued rate control and stroke prophylaxis, rhythm control using an antiarrhythmic and rhythm control using ablation therapy.  She would like to avoid long-term medication use at all possible and would like to pursue ablation to manage her atrial fibrillation.  I discussed the risks, expected efficacy of ablation and expected recovery time of the procedure.  She would like to proceed.  She will continue her eliquis uninterrupted until the time of ablation.   Risk, benefits, and alternatives to EP study and radiofrequency ablation for afib were also discussed in detail today. These risks include but are not limited to stroke, bleeding, vascular damage, tamponade, perforation, damage to the esophagus, lungs, and other structures, pulmonary vein stenosis, worsening renal function, and death. The patient understands these risk and wishes to proceed.  We will therefore proceed with catheter ablation at the next  available time.  Carto, ICE, anesthesia are requested for the procedure.  Will also obtain CT PV protocol prior to the procedure to exclude LAA thrombus and further evaluate atrial anatomy.    -------------------------------------------------------------------------- I have seen, examined the patient, and reviewed the above assessment and plan.    Plan for PVI.  Lanier Prude, MD 12/10/2020 8:22 AM

## 2020-12-10 NOTE — Addendum Note (Signed)
Addendum  created 12/10/20 1249 by Lovie Chol, CRNA   Clinical Note Signed

## 2020-12-10 NOTE — Discharge Instructions (Signed)
Cardiac Ablation, Care After  This sheet gives you information about how to care for yourself after your procedure. Your health care provider may also give you more specific instructions. If you have problems or questions, contact your health care provider. What can I expect after the procedure? After the procedure, it is common to have:  Bruising around your puncture site.  Tenderness around your puncture site.  Skipped heartbeats.  Tiredness (fatigue).  Follow these instructions at home: Puncture site care   Follow instructions from your health care provider about how to take care of your puncture site. Make sure you: ? If present, leave stitches (sutures), skin glue, or adhesive strips in place. These skin closures may need to stay in place for up to 2 weeks. If adhesive strip edges start to loosen and curl up, you may trim the loose edges. Do not remove adhesive strips completely unless your health care provider tells you to do that. ? If a square bandage is present, this may be removed in 24 hours.   Check your puncture site every day for signs of infection. Check for: ? Redness, swelling, or pain. ? Fluid or blood. If your puncture site starts to bleed, lie down on your back, apply firm pressure to the area, and contact your health care provider. ? Warmth. ? Pus or a bad smell. Driving  Do not drive for at least 4 days after your procedure or however long your health care provider recommends. (Do not resume driving if you have previously been instructed not to drive for other health reasons.)  Do not drive or use heavy machinery while taking prescription pain medicine. Activity  Avoid activities that take a lot of effort for at least 7 days after your procedure.  Do not lift anything that is heavier than 5 lb (4.5 kg) for one week.   No sexual activity for 1 week.   Return to your normal activities as told by your health care provider. Ask your health care provider what  activities are safe for you. General instructions  Take over-the-counter and prescription medicines only as told by your health care provider.  Do not use any products that contain nicotine or tobacco, such as cigarettes and e-cigarettes. If you need help quitting, ask your health care provider.  You may shower after 24 hours, but Do not take baths, swim, or use a hot tub for 1 week.   Do not drink alcohol for 24 hours after your procedure.  Keep all follow-up visits as told by your health care provider. This is important. Contact a health care provider if:  You have redness, mild swelling, or pain around your puncture site.  You have fluid or blood coming from your puncture site that stops after applying firm pressure to the area.  Your puncture site feels warm to the touch.  You have pus or a bad smell coming from your puncture site.  You have a fever.  You have chest pain or discomfort that spreads to your neck, jaw, or arm.  You are sweating a lot.  You feel nauseous.  You have a fast or irregular heartbeat.  You have shortness of breath.  You are dizzy or light-headed and feel the need to lie down.  You have pain or numbness in the arm or leg closest to your puncture site. Get help right away if:  Your puncture site suddenly swells.  Your puncture site is bleeding and the bleeding does not stop after applying firm   pressure to the area. These symptoms may represent a serious problem that is an emergency. Do not wait to see if the symptoms will go away. Get medical help right away. Call your local emergency services (911 in the [place] Do not drive yourself to the hospital. Summary  After the procedure, it is normal to have bruising and tenderness at the puncture site in your groin, neck, or forearm.  Check your puncture site every day for signs of infection.  Get help right away if your puncture site is bleeding and the bleeding does not stop after applying firm  pressure to the area. This is a medical emergency. This information is not intended to replace advice given to you by your health care provider. Make sure you discuss any questions you have with your health care provider.    

## 2020-12-10 NOTE — Transfer of Care (Signed)
Immediate Anesthesia Transfer of Care Note  Patient: Nichole Berg  Procedure(s) Performed: ATRIAL FIBRILLATION ABLATION (N/A )  Patient Location: Cath Lab  Anesthesia Type:General  Level of Consciousness: oriented and patient cooperative  Airway & Oxygen Therapy: Patient Spontanous Breathing and Patient connected to nasal cannula oxygen  Post-op Assessment: Report given to RN and Post -op Vital signs reviewed and stable  Post vital signs: Reviewed  Last Vitals:  Vitals Value Taken Time  BP 120/60 12/10/20 1121  Temp    Pulse 62 12/10/20 1122  Resp 0 12/10/20 1122  SpO2 100 % 12/10/20 1122  Vitals shown include unvalidated device data.  Last Pain:  Vitals:   12/10/20 0816  TempSrc:   PainSc: 0-No pain      Patients Stated Pain Goal: 5 (12/10/20 0816)  Complications: No complications documented.

## 2020-12-10 NOTE — Anesthesia Postprocedure Evaluation (Signed)
Anesthesia Post Note  Patient: Nichole Berg  Procedure(s) Performed: ATRIAL FIBRILLATION ABLATION (N/A )     Patient location during evaluation: PACU Anesthesia Type: General Level of consciousness: awake and alert and oriented Pain management: pain level controlled Vital Signs Assessment: post-procedure vital signs reviewed and stable Respiratory status: spontaneous breathing, nonlabored ventilation and respiratory function stable Cardiovascular status: blood pressure returned to baseline Postop Assessment: no apparent nausea or vomiting Anesthetic complications: no   No complications documented.            Kaylyn Layer

## 2020-12-10 NOTE — Anesthesia Procedure Notes (Signed)
Procedure Name: Intubation Date/Time: 12/10/2020 8:58 AM Performed by: Lovie Chol, CRNA Pre-anesthesia Checklist: Patient identified, Emergency Drugs available, Suction available and Patient being monitored Patient Re-evaluated:Patient Re-evaluated prior to induction Oxygen Delivery Method: Circle System Utilized Preoxygenation: Pre-oxygenation with 100% oxygen Induction Type: IV induction Ventilation: Mask ventilation without difficulty Laryngoscope Size: Miller and 2 Grade View: Grade I Tube type: Oral Tube size: 7.0 mm Number of attempts: 1 Airway Equipment and Method: Stylet Placement Confirmation: ETT inserted through vocal cords under direct vision,  positive ETCO2 and breath sounds checked- equal and bilateral Secured at: 21 cm Tube secured with: Tape Dental Injury: Teeth and Oropharynx as per pre-operative assessment

## 2020-12-10 NOTE — Anesthesia Preprocedure Evaluation (Addendum)
Anesthesia Evaluation  Patient identified by MRN, date of birth, ID band Patient awake    Reviewed: Allergy & Precautions, NPO status , Patient's Chart, lab work & pertinent test results  History of Anesthesia Complications Negative for: history of anesthetic complications  Airway Mallampati: I  TM Distance: >3 FB Neck ROM: Full    Dental no notable dental hx. (+) Dental Advisory Given, Teeth Intact   Pulmonary former smoker,    Pulmonary exam normal        Cardiovascular Normal cardiovascular exam+ dysrhythmias (on Eliquis) Atrial Fibrillation   TTE 12/21: EF 60-65%, moderate LAE, mild RAE, mild MR, mild to moderate TR   Neuro/Psych negative neurological ROS  negative psych ROS   GI/Hepatic negative GI ROS, Neg liver ROS,   Endo/Other  negative endocrine ROS  Renal/GU negative Renal ROS  negative genitourinary   Musculoskeletal  (+) Arthritis ,   Abdominal   Peds  Hematology negative hematology ROS (+)   Anesthesia Other Findings Day of surgery medications reviewed with patient.  Reproductive/Obstetrics negative OB ROS                           Anesthesia Physical Anesthesia Plan  ASA: II  Anesthesia Plan: General   Post-op Pain Management:    Induction: Intravenous  PONV Risk Score and Plan: 3 and Treatment may vary due to age or medical condition, Ondansetron and Dexamethasone  Airway Management Planned: Oral ETT  Additional Equipment: None  Intra-op Plan:   Post-operative Plan: Extubation in OR  Informed Consent: I have reviewed the patients History and Physical, chart, labs and discussed the procedure including the risks, benefits and alternatives for the proposed anesthesia with the patient or authorized representative who has indicated his/her understanding and acceptance.     Dental advisory given  Plan Discussed with: CRNA  Anesthesia Plan Comments:         Anesthesia Quick Evaluation

## 2020-12-11 ENCOUNTER — Encounter (HOSPITAL_COMMUNITY): Payer: Self-pay | Admitting: Cardiology

## 2020-12-17 DIAGNOSIS — M9901 Segmental and somatic dysfunction of cervical region: Secondary | ICD-10-CM | POA: Diagnosis not present

## 2020-12-17 DIAGNOSIS — M5411 Radiculopathy, occipito-atlanto-axial region: Secondary | ICD-10-CM | POA: Diagnosis not present

## 2020-12-24 DIAGNOSIS — M9901 Segmental and somatic dysfunction of cervical region: Secondary | ICD-10-CM | POA: Diagnosis not present

## 2020-12-24 DIAGNOSIS — M5411 Radiculopathy, occipito-atlanto-axial region: Secondary | ICD-10-CM | POA: Diagnosis not present

## 2021-01-07 ENCOUNTER — Other Ambulatory Visit: Payer: Self-pay

## 2021-01-07 ENCOUNTER — Encounter (HOSPITAL_COMMUNITY): Payer: Self-pay | Admitting: Physician Assistant

## 2021-01-07 ENCOUNTER — Ambulatory Visit (HOSPITAL_COMMUNITY)
Admission: RE | Admit: 2021-01-07 | Discharge: 2021-01-07 | Disposition: A | Discharge status: Home / Self Care | Payer: Medicare PPO | Source: Ambulatory Visit | Attending: Physician Assistant | Admitting: Physician Assistant

## 2021-01-07 VITALS — BP 122/76 | HR 67 | Ht 64.0 in | Wt 135.2 lb

## 2021-01-07 DIAGNOSIS — Z8249 Family history of ischemic heart disease and other diseases of the circulatory system: Secondary | ICD-10-CM | POA: Diagnosis not present

## 2021-01-07 DIAGNOSIS — Z87891 Personal history of nicotine dependence: Secondary | ICD-10-CM | POA: Insufficient documentation

## 2021-01-07 DIAGNOSIS — Z79899 Other long term (current) drug therapy: Secondary | ICD-10-CM | POA: Diagnosis not present

## 2021-01-07 DIAGNOSIS — I4819 Other persistent atrial fibrillation: Secondary | ICD-10-CM

## 2021-01-07 DIAGNOSIS — Z7901 Long term (current) use of anticoagulants: Secondary | ICD-10-CM | POA: Insufficient documentation

## 2021-01-07 DIAGNOSIS — I10 Essential (primary) hypertension: Secondary | ICD-10-CM | POA: Insufficient documentation

## 2021-01-07 DIAGNOSIS — D6869 Other thrombophilia: Secondary | ICD-10-CM

## 2021-01-07 DIAGNOSIS — R519 Headache, unspecified: Secondary | ICD-10-CM | POA: Diagnosis not present

## 2021-01-07 NOTE — Progress Notes (Signed)
Primary Care Physician: Madelin Headings, MD Primary Cardiologist: Dr Anne Fu Primary Electrophysiologist: Dr Lalla Brothers Referring Physician: Dr Rinaldo Cloud is a 75 y.o. female with a history of HTN, HLD, and atrial fibrillation who presents for follow up in the [facility]. The patient was initially diagnosed with atrial fibrillation 06/25/20 after presenting to urgent care with symptoms of dizziness and palpitations. Patient is on Eliquis for a CHADS2VASC score of 4. She underwent DCCV on 08/15/20. Unfortunately, she felt she was back in afib with palpitations about 3 weeks later.   On follow up today, patient is s/p afib ablation with Dr Lalla Brothers on 12/10/20. She reports that she has had occasional "flutters" but these were rare and brief. She denies CP, swallowing pain, or groin issues. She did have a headache after starting Protonix which resolved when she stopped the medication.   Today, she denies symptoms of chest pain, shortness of breath, orthopnea, PND, lower extremity edema, dizziness, presyncope, syncope, snoring, daytime somnolence, bleeding, or neurologic sequela. The patient is tolerating medications without difficulties and is otherwise without complaint today.    Atrial Fibrillation Risk Factors:  she does not have symptoms or diagnosis of sleep apnea. she does not have a history of rheumatic fever. she does not have a history of alcohol use. The patient does have a history of early familial atrial fibrillation or other arrhythmias. Son has afib.  she has a BMI of Body mass index is 23.21 kg/m.Marland Kitchen Filed Weights   01/07/21 1011  Weight: 61.3 kg    Family History  Problem Relation Age of Onset  . Atrial fibrillation Mother        Has pacer doing very well age 66 passed suddnely age 7 poss CV     Atrial Fibrillation Management history:  Previous antiarrhythmic drugs: none Previous cardioversions: 08/15/20 Previous ablations:  12/10/20 CHADS2VASC score: 4 Anticoagulation history: Eliquis   Past Medical History:  Diagnosis Date  . Cataract 10/18/2011   Followed by opthalmology.   . COLONIC POLYPS, HX OF 10/12/2009  . COVID-19 03/2020  . HYPERLIPIDEMIA 05/31/2007  . NEPHROLITHIASIS, HX OF 05/31/2007   Past Surgical History:  Procedure Laterality Date  . ATRIAL FIBRILLATION ABLATION N/A 12/10/2020   Procedure: ATRIAL FIBRILLATION ABLATION;  Surgeon: Lanier Prude, MD;  Location: [facility] INVASIVE CV LAB;  Service: Cardiovascular;  Laterality: N/A;  . CARDIOVERSION N/A 08/15/2020   Procedure: CARDIOVERSION;  Surgeon: Meriam Sprague, MD;  Location: [facility] ENDOSCOPY;  Service: Cardiovascular;  Laterality: N/A;  . CATARACT EXTRACTION, BILATERAL      Current Outpatient Medications  Medication Sig Dispense Refill  . apixaban (ELIQUIS) 5 MG TABS tablet Take 1 tablet (5 mg total) by mouth 2 (two) times daily. 180 tablet 6  . Ascorbic Acid (VITAMIN C) 1000 MG tablet Take 1,000 mg by mouth daily.    . Cholecalciferol (VITAMIN D) 2000 UNITS tablet Take 2,000 Units by mouth daily.    Marland Kitchen gabapentin (NEURONTIN) 100 MG capsule Take 1 capsule (100 mg total) by mouth at bedtime. (Patient taking differently: Take 100 mg by mouth at bedtime as needed (pain).) 180 capsule 0  . LUTEIN PO Take 1 tablet by mouth daily.    . melatonin 5 MG TABS Take 5 mg by mouth at bedtime as needed (sleep.).     Marland Kitchen metoprolol succinate (TOPROL-XL) 50 MG 24 hr tablet Take 1 tablet (50 mg total) by mouth daily. Take with or immediately following a meal. 90 tablet  3  . Turmeric 500 MG CAPS Take 500 mg by mouth in the morning and at bedtime.    . Artificial Tear Ointment (DRY EYES OP) Apply 1 drop to eye daily as needed (dry eyes).    Marland Kitchen diclofenac Sodium (VOLTAREN) 1 % GEL Apply 1 application topically 4 (four) times daily as needed (pain.).      No current facility-administered medications for this encounter.    Allergies  Allergen Reactions  .  Sulfonamide Derivatives Itching and Rash    Social History   Socioeconomic History  . Marital status: Single    Spouse name: Not on file  . Number of children: Not on file  . Years of education: Not on file  . Highest education level: Not on file  Occupational History  . Occupation: Retired  Tobacco Use  . Smoking status: Former Smoker    Years: 2.00    Types: Cigarettes    Quit date: 09/08/1964    Years since quitting: 56.3  . Smokeless tobacco: Never Used  Vaping Use  . Vaping Use: Never used  Substance and Sexual Activity  . Alcohol use: Yes    Comment: occassional  . Drug use: No  . Sexual activity: Not on file  Other Topics Concern  . 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 Difficulty of Paying Living Expenses: Not hard at all  Food Insecurity: No Food Insecurity  . Worried About Programme researcher, broadcasting/film/video in the Last Year: Never true  . Ran Out of Food in the Last Year: Never true  Transportation Needs: No Transportation Needs  . Lack of Transportation (Medical): No  . Lack of Transportation (Non-Medical): No  Physical Activity: Sufficiently Active  . Days of Exercise per Week: 7 days  . Minutes of Exercise per Session: 90 min  Stress: No Stress Concern Present  . Feeling of Stress : Only a little  Social Connections: Moderately Isolated  . Frequency of Communication with Friends and Family: More than three times a week  . Frequency of Social Gatherings with Friends and Family: Once a week  . Attends Religious Services: Never  . Active Member of Clubs or Organizations: Yes  . Attends Banker Meetings: 1 to 4 times per year  . Marital Status: Never married  Intimate Partner Violence: Not At Risk  . Fear of Current or Ex-Partner: No  . Emotionally Abused: No  .  Physically Abused: No  . Sexually Abused: No     ROS- All systems are reviewed and negative except as per the HPI above.  Physical Exam: Vitals:   01/07/21 1011  BP: 122/76  Pulse: 67  Weight: 61.3 kg  Height: 5\' 4"  (1.626 m)    GEN- The patient is a well appearing elderly female, alert and oriented x 3 today.   HEENT-head normocephalic, atraumatic, sclera clear, conjunctiva pink, hearing intact, trachea midline. Lungs- Clear to ausculation bilaterally, normal work of breathing Heart- Regular rate and rhythm, no murmurs, rubs or gallops  GI- soft, NT, ND, + BS Extremities- no clubbing, cyanosis, or edema MS- no significant deformity or atrophy Skin- no rash or lesion Psych- euthymic mood, full affect Neuro- strength and sensation are intact  Wt Readings from Last 3 Encounters:  01/07/21 61.3 kg  12/10/20 60.8 kg  10/16/20 62.3 kg    EKG today demonstrates  SR, 1st degree AV block, inc RBBB Vent. rate 67 BPM PR interval 210 ms QRS duration 80 ms QT/QTcB 408/431 ms  Echo 08/10/20 demonstrated  1. Left ventricular ejection fraction, by estimation, is 60 to 65%. The  left ventricle has normal function. The left ventricle has no regional  wall motion abnormalities. Left ventricular diastolic parameters are  indeterminate.  2. Right ventricular systolic function is normal. The right ventricular  size is normal. There is normal pulmonary artery systolic pressure.  3. Left atrial size was moderately dilated.  4. Right atrial size was mildly dilated.  5. The mitral valve is degenerative. Mild mitral valve regurgitation. No  evidence of mitral stenosis. Moderate mitral annular calcification.  6. Tricuspid valve regurgitation is mild to moderate.  7. The aortic valve is tricuspid. Aortic valve regurgitation is not  visualized. Mild to moderate aortic valve sclerosis/calcification is  present, without any evidence of aortic stenosis.  8. The inferior vena cava is  normal in size with greater than 50%  respiratory variability, suggesting right atrial pressure of 3 mmHg.  Epic records are reviewed at length today  CHA2DS2-VASc Score = 4  The patient's score is based upon: CHF History: No HTN History: Yes Diabetes History: No Stroke History: No Vascular Disease History: No Age Score: 2 Gender Score: 1      ASSESSMENT AND PLAN: 1. Persistent Atrial Fibrillation (ICD10:  I48.19) The patient's CHA2DS2-VASc score is 4, indicating a 4.8% annual risk of stroke. S/p afib ablation 12/10/20 Patient appears to be maintaining SR. Continue Eliquis 5 mg BID with no missed doses for at least 3 months post ablation.  Continue Toprol 50 mg daily  2. Secondary Hypercoagulable State (ICD10:  D68.69) The patient is at significant risk for stroke/thromboembolism based upon her CHA2DS2-VASc Score of 4.  Continue Apixaban (Eliquis).   3. HTN Stable, no changes today.   Follow up with Dr Lalla Brothers as scheduled.    Jorja Loa PA-C Afib [facility] [address] [place], [place] [address] [phone] 01/07/2021 10:16 AM

## 2021-01-21 DIAGNOSIS — M5411 Radiculopathy, occipito-atlanto-axial region: Secondary | ICD-10-CM | POA: Diagnosis not present

## 2021-01-21 DIAGNOSIS — M9901 Segmental and somatic dysfunction of cervical region: Secondary | ICD-10-CM | POA: Diagnosis not present

## 2021-02-12 DIAGNOSIS — L570 Actinic keratosis: Secondary | ICD-10-CM | POA: Diagnosis not present

## 2021-02-12 DIAGNOSIS — L814 Other melanin hyperpigmentation: Secondary | ICD-10-CM | POA: Diagnosis not present

## 2021-03-22 ENCOUNTER — Ambulatory Visit: Payer: Medicare PPO | Admitting: Cardiology

## 2021-04-12 ENCOUNTER — Other Ambulatory Visit: Payer: Self-pay

## 2021-04-12 ENCOUNTER — Ambulatory Visit: Payer: Medicare PPO | Admitting: Cardiology

## 2021-04-12 ENCOUNTER — Encounter: Payer: Self-pay | Admitting: Cardiology

## 2021-04-12 VITALS — BP 126/74 | HR 64 | Ht 64.0 in | Wt 131.0 lb

## 2021-04-12 DIAGNOSIS — I10 Essential (primary) hypertension: Secondary | ICD-10-CM

## 2021-04-12 DIAGNOSIS — I4819 Other persistent atrial fibrillation: Secondary | ICD-10-CM | POA: Diagnosis not present

## 2021-04-12 NOTE — Patient Instructions (Addendum)
Medication Instructions:  Your physician recommends that you continue on your current medications as directed. Please refer to the Current Medication list given to you today. *If you need a refill on your cardiac medications before your next appointment, please call your pharmacy*  Lab Work: None ordered. If you have labs (blood work) drawn today and your tests are completely normal, you will receive your results only by: MyChart Message (if you have MyChart) OR A paper copy in the mail If you have any lab test that is abnormal or we need to change your treatment, we will call you to review the results.  Testing/Procedures: None ordered.  Follow-Up: At [facility], you and your health needs are our priority.  As part of our continuing mission to provide you with exceptional heart care, we have created designated Provider Care Teams.  These Care Teams include your primary Cardiologist (physician) and Advanced Practice Providers (APPs -  Physician Assistants and Nurse Practitioners) who all work together to provide you with the care you need, when you need it.  Your next appointment:   Your physician wants you to follow-up in: 9 months with Nichole Prude, MD.  Nichole Berg will receive a reminder letter in the mail two months in advance. If you don't receive a letter, please call our office to schedule the follow-up appointment.

## 2021-04-12 NOTE — Progress Notes (Signed)
Electrophysiology Office Follow up Visit Note:    Date:  04/12/2021   ID:  Nichole Berg, DOB 08-23-1946, MRN [id number]  PCP:  Madelin Headings, MD  [facility] Cardiologist:  Donato Schultz, MD  [facility] Electrophysiologist:  Lanier Prude, MD    Interval History:    Nichole Berg is a 75 y.o. female who presents for a follow up visit after her atrial fibrillation ablation on December 10, 2020.  During the ablation I isolated the veins and posterior wall.  She saw Jorja Loa on Jan 07, 2021.  At that appointment she reported no recurrence of arrhythmias.  She is on Eliquis for stroke prophylaxis.  She is done very well since her ablation procedure without recurrence of arrhythmia.  She has occasional PVCs that she can appreciate but these are too big of a nuisance to her.  She still takes Eliquis for stroke prophylaxis without bleeding issues.    Past Medical History:  Diagnosis Date   Cataract 10/18/2011   Followed by opthalmology.    COLONIC POLYPS, HX OF 10/12/2009   COVID-19 03/2020   HYPERLIPIDEMIA 05/31/2007   NEPHROLITHIASIS, HX OF 05/31/2007    Past Surgical History:  Procedure Laterality Date   ATRIAL FIBRILLATION ABLATION N/A 12/10/2020   Procedure: ATRIAL FIBRILLATION ABLATION;  Surgeon: Lanier Prude, MD;  Location: [facility] INVASIVE CV LAB;  Service: Cardiovascular;  Laterality: N/A;   CARDIOVERSION N/A 08/15/2020   Procedure: CARDIOVERSION;  Surgeon: Meriam Sprague, MD;  Location: [facility] ENDOSCOPY;  Service: Cardiovascular;  Laterality: N/A;   CATARACT EXTRACTION, BILATERAL      Current Medications: Current Meds  Medication Sig   apixaban (ELIQUIS) 5 MG TABS tablet Take 1 tablet (5 mg total) by mouth 2 (two) times daily.   Artificial Tear Ointment (DRY EYES OP) Apply 1 drop to eye daily as needed (dry eyes).   Cholecalciferol (VITAMIN D) 2000 UNITS tablet Take 2,000 Units by mouth daily.   diclofenac Sodium (VOLTAREN) 1 % GEL Apply 1 application topically 4 (four)  times daily as needed (pain.).    gabapentin (NEURONTIN) 100 MG capsule Take 1 capsule (100 mg total) by mouth at bedtime. (Patient taking differently: Take 100 mg by mouth at bedtime as needed (pain).)   LUTEIN PO Take 1 tablet by mouth daily.   metoprolol succinate (TOPROL-XL) 50 MG 24 hr tablet Take 1 tablet (50 mg total) by mouth daily. Take with or immediately following a meal.     Allergies:   Sulfonamide derivatives   Social History   Socioeconomic History   Marital status: Single    Spouse name: Not on file   Number of children: Not on file   Years of education: Not on file   Highest education level: Not on file  Occupational History   Occupation: Retired  Tobacco Use   Smoking status: Former    Years: 2.00    Types: Cigarettes    Quit date: 09/08/1964    Years since quitting: 56.6   Smokeless tobacco: Never  Vaping Use   Vaping Use: Never used  Substance and Sexual Activity   Alcohol use: Yes    Comment: occassional   Drug use: No   Sexual activity: Not on file  Other Topics Concern   Not on file  Social History Narrative   Retired Publishing rights manager    Non smoker   Works as Engineer, 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    [employer] 2 1 cat neg tob  Social Determinants of Health   Financial Resource Strain: Low Risk    Difficulty of Paying Living Expenses: Not hard at all  Food Insecurity: No Food Insecurity   Worried About Programme researcher, broadcasting/film/video in the Last Year: Never true   Ran Out of Food in the Last Year: Never true  Transportation Needs: No Transportation Needs   Lack of Transportation (Medical): No   Lack of Transportation (Non-Medical): No  Physical Activity: Sufficiently Active   Days of Exercise per Week: 7 days   Minutes of Exercise per Session: 90 min  Stress: No Stress Concern Present   Feeling of Stress : Only a little  Social Connections: Moderately Isolated   Frequency of Communication with Friends and Family: More than three times a week   Frequency  of Social Gatherings with Friends and Family: Once a week   Attends Religious Services: Never   Database administrator or Organizations: Yes   Attends Banker Meetings: 1 to 4 times per year   Marital Status: Never married     Family History: The patient's family history includes Atrial fibrillation in her mother.  ROS:   Please see the history of present illness.    All other systems reviewed and are negative.  EKGs/Labs/Other Studies Reviewed:    The following studies were reviewed today: Ablation record  EKG:  The ekg ordered today demonstrates sinus rhythm  Recent Labs: 07/19/2020: TSH 1.410 11/19/2020: BUN 15; Creatinine, Ser 0.76; Hemoglobin 12.7; Platelets 220; Potassium 4.8; Sodium 138  Recent Lipid Panel    Component Value Date/Time   CHOL 256 (H) 10/17/2019 1058   TRIG 86.0 10/17/2019 1058   HDL 71.20 10/17/2019 1058   CHOLHDL 4 10/17/2019 1058   VLDL 17.2 10/17/2019 1058   LDLCALC 167 (H) 10/17/2019 1058   LDLDIRECT 160.3 10/13/2011 1058    Physical Exam:    VS:  BP 126/74   Pulse 64   Ht 5\' 4"  (1.626 m)   Wt 131 lb (59.4 kg)   BMI 22.49 kg/m     Wt Readings from Last 3 Encounters:  04/12/21 131 lb (59.4 kg)  01/07/21 135 lb 3.2 oz (61.3 kg)  12/10/20 134 lb (60.8 kg)     GEN:  Well nourished, well developed in no acute distress HEENT: Normal NECK: No JVD; No carotid bruits LYMPHATICS: No lymphadenopathy CARDIAC: RRR, no murmurs, rubs, gallops RESPIRATORY:  Clear to auscultation without rales, wheezing or rhonchi  ABDOMEN: Soft, non-tender, non-distended MUSCULOSKELETAL:  No edema; No deformity  SKIN: Warm and dry NEUROLOGIC:  Alert and oriented x 3 PSYCHIATRIC:  Normal affect   ASSESSMENT:    1. Persistent atrial fibrillation (HCC)   2. Essential hypertension    PLAN:    In order of problems listed above:    1. Persistent atrial fibrillation [facility]) Doing well after her ablation.  Maintaining sinus rhythm.  Continue  Eliquis for stroke prophylaxis.  I will plan to see her back in 9 months or sooner as needed.  2. Essential hypertension Controlled.  Continue current regimen.      Medication Adjustments/Labs and Tests Ordered: Current medicines are reviewed at length with the patient today.  Concerns regarding medicines are outlined above.  Orders Placed This Encounter  Procedures   EKG 12-Lead   No orders of the defined types were placed in this encounter.    Signed, [facility], MD, [facility], [facility] 04/12/2021 11:00 AM    Electrophysiology [facility]

## 2021-04-16 DIAGNOSIS — M9901 Segmental and somatic dysfunction of cervical region: Secondary | ICD-10-CM | POA: Diagnosis not present

## 2021-04-16 DIAGNOSIS — M5411 Radiculopathy, occipito-atlanto-axial region: Secondary | ICD-10-CM | POA: Diagnosis not present

## 2021-05-06 ENCOUNTER — Ambulatory Visit (INDEPENDENT_AMBULATORY_CARE_PROVIDER_SITE_OTHER): Payer: Medicare PPO

## 2021-05-06 ENCOUNTER — Other Ambulatory Visit: Payer: Self-pay

## 2021-05-06 VITALS — BP 110/64 | HR 66 | Temp 98.3°F | Ht 64.0 in | Wt 133.0 lb

## 2021-05-06 DIAGNOSIS — Z0000 Encounter for general adult medical examination without abnormal findings: Secondary | ICD-10-CM

## 2021-05-06 NOTE — Patient Instructions (Signed)
Nichole Berg , Thank you for taking time to come for your Medicare Wellness Visit. I appreciate your ongoing commitment to your health goals. Please review the following plan we discussed and let me know if I can assist you in the future.   Screening recommendations/referrals: Colonoscopy: no longer required  Mammogram: 09/12/2020 Bone Density: 09/07/2019 Recommended yearly ophthalmology/optometry visit for glaucoma screening and checkup Recommended yearly dental visit for hygiene and checkup  Vaccinations: Influenza vaccine: due in fall 2022  Pneumococcal vaccine: completed series  Tdap vaccine: due upon injury  Shingles vaccine: completed series     Advanced directives: will provide copies   Conditions/risks identified: none   Next appointment: none    Preventive Care 65 Years and Older, Female Preventive care refers to lifestyle choices and visits with your health care provider that can promote health and wellness. What does preventive care include? A yearly physical exam. This is also called an annual well check. Dental exams once or twice a year. Routine eye exams. Ask your health care provider how often you should have your eyes checked. Personal lifestyle choices, including: Daily care of your teeth and gums. Regular physical activity. Eating a healthy diet. Avoiding tobacco and drug use. Limiting alcohol use. Practicing safe sex. Taking low-dose aspirin every day. Taking vitamin and mineral supplements as recommended by your health care provider. What happens during an annual well check? The services and screenings done by your health care provider during your annual well check will depend on your age, overall health, lifestyle risk factors, and family history of disease. Counseling  Your health care provider may ask you questions about your: Alcohol use. Tobacco use. Drug use. Emotional well-being. Home and relationship well-being. Sexual activity. Eating  habits. History of falls. Memory and ability to understand (cognition). Work and work Astronomer. Reproductive health. Screening  You may have the following tests or measurements: Height, weight, and BMI. Blood pressure. Lipid and cholesterol levels. These may be checked every 5 years, or more frequently if you are over 35 years old. Skin check. Lung cancer screening. You may have this screening every year starting at age 76 if you have a 30-pack-year history of smoking and currently smoke or have quit within the past 15 years. Fecal occult blood test (FOBT) of the stool. You may have this test every year starting at age 44. Flexible sigmoidoscopy or colonoscopy. You may have a sigmoidoscopy every 5 years or a colonoscopy every 10 years starting at age 19. Hepatitis C blood test. Hepatitis B blood test. Sexually transmitted disease (STD) testing. Diabetes screening. This is done by checking your blood sugar (glucose) after you have not eaten for a while (fasting). You may have this done every 1-3 years. Bone density scan. This is done to screen for osteoporosis. You may have this done starting at age 66. Mammogram. This may be done every 1-2 years. Talk to your health care provider about how often you should have regular mammograms. Talk with your health care provider about your test results, treatment options, and if necessary, the need for more tests. Vaccines  Your health care provider may recommend certain vaccines, such as: Influenza vaccine. This is recommended every year. Tetanus, diphtheria, and acellular pertussis (Tdap, Td) vaccine. You may need a Td booster every 10 years. Zoster vaccine. You may need this after age 28. Pneumococcal 13-valent conjugate (PCV13) vaccine. One dose is recommended after age 10. Pneumococcal polysaccharide (PPSV23) vaccine. One dose is recommended after age 92. Talk to your health  care provider about which screenings and vaccines you need and how  often you need them. This information is not intended to replace advice given to you by your health care provider. Make sure you discuss any questions you have with your health care provider. Document Released: 09/21/2015 Document Revised: 05/14/2016 Document Reviewed: 06/26/2015 Elsevier Interactive Patient Education  2017 [place] Prevention in the Home Falls can cause injuries. They can happen to people of all ages. There are many things you can do to make your home safe and to help prevent falls. What can I do on the outside of my home? Regularly fix the edges of walkways and driveways and fix any cracks. Remove anything that might make you trip as you walk through a door, such as a raised step or threshold. Trim any bushes or trees on the path to your home. Use bright outdoor lighting. Clear any walking paths of anything that might make someone trip, such as rocks or tools. Regularly check to see if handrails are loose or broken. Make sure that both sides of any steps have handrails. Any raised decks and porches should have guardrails on the edges. Have any leaves, snow, or ice cleared regularly. Use sand or salt on walking paths during winter. Clean up any spills in your garage right away. This includes oil or grease spills. What can I do in the bathroom? Use night lights. Install grab bars by the toilet and in the tub and shower. Do not use towel bars as grab bars. Use non-skid mats or decals in the tub or shower. If you need to sit down in the shower, use a plastic, non-slip stool. Keep the floor dry. Clean up any water that spills on the floor as soon as it happens. Remove soap buildup in the tub or shower regularly. Attach bath mats securely with double-sided non-slip rug tape. Do not have throw rugs and other things on the floor that can make you trip. What can I do in the bedroom? Use night lights. Make sure that you have a light by your bed that is easy to  reach. Do not use any sheets or blankets that are too big for your bed. They should not hang down onto the floor. Have a firm chair that has side arms. You can use this for support while you get dressed. Do not have throw rugs and other things on the floor that can make you trip. What can I do in the kitchen? Clean up any spills right away. Avoid walking on wet floors. Keep items that you use a lot in easy-to-reach places. If you need to reach something above you, use a strong step stool that has a grab bar. Keep electrical cords out of the way. Do not use floor polish or wax that makes floors slippery. If you must use wax, use non-skid floor wax. Do not have throw rugs and other things on the floor that can make you trip. What can I do with my stairs? Do not leave any items on the stairs. Make sure that there are handrails on both sides of the stairs and use them. Fix handrails that are broken or loose. Make sure that handrails are as long as the stairways. Check any carpeting to make sure that it is firmly attached to the stairs. Fix any carpet that is loose or worn. Avoid having throw rugs at the top or bottom of the stairs. If you do have throw rugs, attach them to  the floor with carpet tape. Make sure that you have a light switch at the top of the stairs and the bottom of the stairs. If you do not have them, ask someone to add them for you. What else can I do to help prevent falls? Wear shoes that: Do not have high heels. Have rubber bottoms. Are comfortable and fit you well. Are closed at the toe. Do not wear sandals. If you use a stepladder: Make sure that it is fully opened. Do not climb a closed stepladder. Make sure that both sides of the stepladder are locked into place. Ask someone to hold it for you, if possible. Clearly mark and make sure that you can see: Any grab bars or handrails. First and last steps. Where the edge of each step is. Use tools that help you move  around (mobility aids) if they are needed. These include: Canes. Walkers. Scooters. Crutches. Turn on the lights when you go into a dark area. Replace any light bulbs as soon as they burn out. Set up your furniture so you have a clear path. Avoid moving your furniture around. If any of your floors are uneven, fix them. If there are any pets around you, be aware of where they are. Review your medicines with your doctor. Some medicines can make you feel dizzy. This can increase your chance of falling. Ask your doctor what other things that you can do to help prevent falls. This information is not intended to replace advice given to you by your health care provider. Make sure you discuss any questions you have with your health care provider. Document Released: 06/21/2009 Document Revised: 01/31/2016 Document Reviewed: 09/29/2014 Elsevier Interactive Patient Education  2017 [employer].

## 2021-05-06 NOTE — Progress Notes (Addendum)
Subjective:   Nichole Berg is a 75 y.o. female who presents for Medicare Annual (Subsequent) preventive examination.  Review of Systems    N/a       Objective:    Today's Vitals   05/06/21 1439  BP: 110/64  Pulse: 66  Temp: 98.3 F (36.8 C)  SpO2: 96%  Weight: 133 lb (60.3 kg)  Height: 5\' 4"  (1.626 m)   Body mass index is 22.83 kg/m.  Advanced Directives 05/06/2021 12/10/2020 10/30/2020 08/15/2020 05/23/2020 06/19/2019 06/19/2019  Does Patient Have a Medical Advance Directive? Yes Yes Yes Yes Yes No No  Type of 08/19/2019 of [place];Living will Healthcare Power of [place];Living will Healthcare Power of [place];Living will Healthcare Power of [place];Living will Healthcare Power of [place];Living will - -  Does patient want to make changes to medical advance directive? - No - Patient declined No - Patient declined - - No - Patient declined -  Copy of Healthcare Power of Attorney in Chart? No - copy requested No - copy requested No - copy requested No - copy requested No - copy requested - -  Would patient like information on creating a medical advance directive? - - - - - No - Patient declined No - Patient declined    Current Medications (verified) Outpatient Encounter Medications as of 05/06/2021  Medication Sig   apixaban (ELIQUIS) 5 MG TABS tablet Take 1 tablet (5 mg total) by mouth 2 (two) times daily.   Artificial Tear Ointment (DRY EYES OP) Apply 1 drop to eye daily as needed (dry eyes).   Cholecalciferol (VITAMIN D) 2000 UNITS tablet Take 2,000 Units by mouth daily.   diclofenac Sodium (VOLTAREN) 1 % GEL Apply 1 application topically 4 (four) times daily as needed (pain.).    gabapentin (NEURONTIN) 100 MG capsule Take 1 capsule (100 mg total) by mouth at bedtime. (Patient taking differently: Take 100 mg by mouth at bedtime as needed (pain).)   LUTEIN PO Take 1 tablet by mouth daily.   metoprolol succinate (TOPROL-XL) 50 MG 24 hr tablet Take 1  tablet (50 mg total) by mouth daily. Take with or immediately following a meal.   No facility-administered encounter medications on file as of 05/06/2021.    Allergies (verified) Sulfonamide derivatives   History: Past Medical History:  Diagnosis Date   Cataract 10/18/2011   Followed by opthalmology.    COLONIC POLYPS, HX OF 10/12/2009   COVID-19 03/2020   HYPERLIPIDEMIA 05/31/2007   NEPHROLITHIASIS, HX OF 05/31/2007   Past Surgical History:  Procedure Laterality Date   ATRIAL FIBRILLATION ABLATION N/A 12/10/2020   Procedure: ATRIAL FIBRILLATION ABLATION;  Surgeon: 02/09/2021, MD;  Location: [facility] INVASIVE CV LAB;  Service: Cardiovascular;  Laterality: N/A;   CARDIOVERSION N/A 08/15/2020   Procedure: CARDIOVERSION;  Surgeon: 14/04/2020, MD;  Location[facility] ENDOSCOPY;  Service: Cardiovascular;  Laterality: N/A;   CATARACT EXTRACTION, BILATERAL     Family History  Problem Relation Age of Onset   Atrial fibrillation Mother        Has pacer doing very well age 71 passed suddnely age 34 poss CV   Social History   Socioeconomic History   Marital status: Single    Spouse name: Not on file   Number of children: Not on file   Years of education: Not on file   Highest education level: Not on file  Occupational History   Occupation: Retired  Tobacco Use   Smoking status: Former    Years:  2.00    Types: Cigarettes    Quit date: 09/08/1964    Years since quitting: 56.7   Smokeless tobacco: Never  Vaping Use   Vaping Use: Never used  Substance and Sexual Activity   Alcohol use: Yes    Comment: occassional   Drug use: No   Sexual activity: Not on file  Other Topics Concern   Not on file  Social History Narrative   Retired Publishing rights manager    Non smoker   Works as Engineer, 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    [employer] hof 2 1 cat neg tob      Social Determinants of Corporate investment banker Strain: Low Risk    Difficulty of Paying Living Expenses: Not hard at all  Food  Insecurity: No Food Insecurity   Worried About Programme researcher, broadcasting/film/video in the Last Year: Never true   Barista in the Last Year: Never true  Transportation Needs: No Transportation Needs   Lack of Transportation (Medical): No   Lack of Transportation (Non-Medical): No  Physical Activity: Insufficiently Active   Days of Exercise per Week: 7 days   Minutes of Exercise per Session: 20 min  Stress: No Stress Concern Present   Feeling of Stress : Not at all  Social Connections: Moderately Integrated   Frequency of Communication with Friends and Family: Three times a week   Frequency of Social Gatherings with Friends and Family: Three times a week   Attends Religious Services: Never   Active Member of Clubs or Organizations: Yes   Attends Engineer, structural: More than 4 times per year   Marital Status: Living with partner    Tobacco Counseling Counseling given: Not Answered   Clinical Intake:  Pre-visit preparation completed: Yes  Pain : No/denies pain     Nutritional Risks: None Diabetes: No  How often do you need to have someone help you when you read instructions, pamphlets, or other written materials from your doctor or pharmacy?: 1 - Never What is the last grade level you completed in school?: NP  Diabetic?no  Interpreter Needed?: No  Information entered by :: L.Zairah Arista, LPN   Activities of Daily Living In your present state of health, do you have any difficulty performing the following activities: 05/23/2020  Hearing? N  Vision? N  Difficulty concentrating or making decisions? N  Walking or climbing stairs? N  Dressing or bathing? N  Doing errands, shopping? N  Preparing Food and eating ? N  Using the Toilet? N  In the past six months, have you accidently leaked urine? Y  Comment urgency at times and liners at times  Do you have problems with loss of bowel control? N  Managing your Medications? N  Managing your Finances? N  Housekeeping or  managing your Housekeeping? N  Some recent data might be hidden    Patient Care Team: Panosh, [name], MD as PCP - General Jake Bathe, MD as PCP - Cardiology (Cardiology) Lanier Prude, MD as PCP - Electrophysiology (Cardiology) Sharrell Ku, MD as Consulting Physician (Gastroenterology) Mckinley Jewel, MD as Consulting Physician (Ophthalmology)  Indicate any recent Medical Services you may have received from other than [facility] providers in the past year (date may be approximate).     Assessment:   This is a routine wellness examination for Nichole Berg.  Hearing/Vision screen Vision Screening - Comments:: Annual eye exams   Dietary issues and exercise activities discussed:     Goals  Addressed   None    Depression Screen PHQ 2/9 Scores 05/06/2021 05/23/2020 08/10/2019 07/28/2018 05/25/2017 02/06/2016 06/16/2014  PHQ - 2 Score 0 0 0 0 0 0 0    Fall Risk Fall Risk  05/06/2021 05/23/2020 08/10/2019 07/28/2018 02/06/2016  Falls in the past year? 0 0 0 0 No  Number falls in past yr: 0 0 0 - -  Injury with Fall? 0 0 0 - -  Risk for fall due to : No Fall Risks No Fall Risks - - -  Follow up Falls evaluation completed Falls prevention discussed Falls evaluation completed - -    FALL RISK PREVENTION PERTAINING TO THE HOME:  Any stairs in or around the home? Yes  If so, are there any without handrails? No  Home free of loose throw rugs in walkways, pet beds, electrical cords, etc? Yes  Adequate lighting in your home to reduce risk of falls? Yes   ASSISTIVE DEVICES UTILIZED TO PREVENT FALLS:  Life alert? No  Use of a cane, walker or w/c? No  Grab bars in the bathroom? Yes  Shower chair or bench in shower? No  Elevated toilet seat or a handicapped toilet? Yes   TIMED UP AND GO:  Was the test performed? Yes .  Length of time to ambulate 10 feet: 6 sec.   Gait steady and fast without use of assistive device  Cognitive Function:    Normal cognitive status assessed by  direct observation by this Nurse Health Advisor. No abnormalities found.   6CIT Screen 05/23/2020  Count back from 20 0 points  Months in reverse 0 points  Repeat phrase 2 points    Immunizations Immunization History  Administered Date(s) Administered   Influenza Whole 08/14/2010   Influenza, High Dose Seasonal PF 07/23/2016, 07/12/2019, 06/10/2020   Influenza,inj,Quad PF,6+ Mos 06/16/2014   Influenza-Unspecified 06/23/2017   PFIZER(Purple Top)SARS-COV-2 Vaccination 09/21/2019, 10/12/2019   Pneumococcal Conjugate-13 06/16/2014   Pneumococcal Polysaccharide-23 10/12/2009   Td 09/09/2003, 10/12/2009   Zoster Recombinat (Shingrix) 10/20/2016, 06/23/2017, 10/20/2017    TDAP status: Due, Education has been provided regarding the importance of this vaccine. Advised may receive this vaccine at local pharmacy or Health Dept. Aware to provide a copy of the vaccination record if obtained from local pharmacy or Health Dept. Verbalized acceptance and understanding.  Flu Vaccine status: Up to date  Pneumococcal vaccine status: Up to date  Covid-19 vaccine status: Completed vaccines  Qualifies for Shingles Vaccine? Yes   Zostavax completed Yes   Shingrix Completed?: Yes  Screening Tests Health Maintenance  Topic Date Due   TETANUS/TDAP  10/13/2019   COVID-19 Vaccine (3 - Pfizer risk series) 11/09/2019   INFLUENZA VACCINE  04/08/2021   DEXA SCAN  Completed   Hepatitis C Screening  Completed   PNA vac Low Risk Adult  Completed   Zoster Vaccines- Shingrix  Completed   HPV VACCINES  Aged Out    Health Maintenance  Health Maintenance Due  Topic Date Due   TETANUS/TDAP  10/13/2019   COVID-19 Vaccine (3 - Pfizer risk series) 11/09/2019   INFLUENZA VACCINE  04/08/2021    Colorectal cancer screening: No longer required.   Mammogram status: Completed 09/09/2020. Repeat every year  Bone Density status: Completed 09/07/2019. Results reflect: Bone density results: OSTEOPENIA. Repeat  every 5 years.  Lung Cancer Screening: (Low Dose CT Chest recommended if Age 31-80 years, 30 pack-year currently smoking OR have quit w/in 15years.) does not qualify.   Lung Cancer Screening Referral: n/a  Additional Screening:  Hepatitis C Screening: does not qualify; Completed 05/25/2017  Vision Screening: Recommended annual ophthalmology exams for early detection of glaucoma and other disorders of the eye. Is the patient up to date with their annual eye exam?  Yes  Who is the provider or what is the name of the office in which the patient attends annual eye exams? Dr.Lyles  If pt is not established with a provider, would they like to be referred to a provider to establish care? No .   Dental Screening: Recommended annual dental exams for proper oral hygiene  Community Resource Referral / Chronic Care Management: CRR required this visit?  No   CCM required this visit?  No      Plan:     I have personally reviewed and noted the following in the patient's chart:   Medical and social history Use of alcohol, tobacco or illicit drugs  Current medications and supplements including opioid prescriptions.  Functional ability and status Nutritional status Physical activity Advanced directives List of other physicians Hospitalizations, surgeries, and ER visits in previous 12 months Vitals Screenings to include cognitive, depression, and falls Referrals and appointments  In addition, I have reviewed and discussed with patient certain preventive protocols, quality metrics, and best practice recommendations. A written personalized care plan for preventive services as well as general preventive health recommendations were provided to patient.     March RummageLaura Abhinav Mayorquin, LPN   1/61/09608/31/2022   Nurse Notes: none

## 2021-05-15 ENCOUNTER — Other Ambulatory Visit: Payer: Self-pay | Admitting: Cardiology

## 2021-06-03 DIAGNOSIS — H04123 Dry eye syndrome of bilateral lacrimal glands: Secondary | ICD-10-CM | POA: Diagnosis not present

## 2021-06-03 DIAGNOSIS — Z961 Presence of intraocular lens: Secondary | ICD-10-CM | POA: Diagnosis not present

## 2021-06-03 DIAGNOSIS — H5212 Myopia, left eye: Secondary | ICD-10-CM | POA: Diagnosis not present

## 2021-06-17 DIAGNOSIS — M9901 Segmental and somatic dysfunction of cervical region: Secondary | ICD-10-CM | POA: Diagnosis not present

## 2021-06-17 DIAGNOSIS — M5411 Radiculopathy, occipito-atlanto-axial region: Secondary | ICD-10-CM | POA: Diagnosis not present

## 2021-08-12 ENCOUNTER — Encounter: Payer: Medicare PPO | Admitting: Internal Medicine

## 2021-08-14 DIAGNOSIS — M5411 Radiculopathy, occipito-atlanto-axial region: Secondary | ICD-10-CM | POA: Diagnosis not present

## 2021-08-14 DIAGNOSIS — M9901 Segmental and somatic dysfunction of cervical region: Secondary | ICD-10-CM | POA: Diagnosis not present

## 2021-08-26 ENCOUNTER — Ambulatory Visit (INDEPENDENT_AMBULATORY_CARE_PROVIDER_SITE_OTHER): Payer: Medicare PPO | Admitting: Internal Medicine

## 2021-08-26 ENCOUNTER — Encounter: Payer: Self-pay | Admitting: Internal Medicine

## 2021-08-26 VITALS — BP 120/76 | HR 54 | Temp 97.7°F | Ht 64.0 in | Wt 132.8 lb

## 2021-08-26 DIAGNOSIS — R739 Hyperglycemia, unspecified: Secondary | ICD-10-CM | POA: Diagnosis not present

## 2021-08-26 DIAGNOSIS — I700 Atherosclerosis of aorta: Secondary | ICD-10-CM | POA: Diagnosis not present

## 2021-08-26 DIAGNOSIS — Z0000 Encounter for general adult medical examination without abnormal findings: Secondary | ICD-10-CM | POA: Diagnosis not present

## 2021-08-26 DIAGNOSIS — Z79899 Other long term (current) drug therapy: Secondary | ICD-10-CM

## 2021-08-26 DIAGNOSIS — Z1211 Encounter for screening for malignant neoplasm of colon: Secondary | ICD-10-CM

## 2021-08-26 DIAGNOSIS — E785 Hyperlipidemia, unspecified: Secondary | ICD-10-CM

## 2021-08-26 DIAGNOSIS — Z7901 Long term (current) use of anticoagulants: Secondary | ICD-10-CM | POA: Diagnosis not present

## 2021-08-26 DIAGNOSIS — I4891 Unspecified atrial fibrillation: Secondary | ICD-10-CM | POA: Diagnosis not present

## 2021-08-26 DIAGNOSIS — R931 Abnormal findings on diagnostic imaging of heart and coronary circulation: Secondary | ICD-10-CM

## 2021-08-26 MED ORDER — ROSUVASTATIN CALCIUM 5 MG PO TABS: 5.0000 mg | ORAL_TABLET | Freq: Every day | ORAL | 2 refills | Status: DC

## 2021-08-26 NOTE — Progress Notes (Signed)
Chief Complaint  Patient presents with   Annual Exam    HPI: Patient  Nichole Berg  75 y.o. comes in today for Preventive Health Care visit  Had ablation for atrial fib and is doing quite well since that time Continuing on  elequis.  On metoprolol.  No exercise limitations. Has had elevated cholesterol for a while is willing to revisit considering low-dose statin. Would like to do a Cologuard colon cancer screening her last colonoscopy was clean about 6 or 7 years ago has a remote history of a polyp but no family history.  Health Maintenance  Topic Date Due   COVID-19 Vaccine (4 - Booster for Pfizer series) 10/02/2021   TETANUS/TDAP  06/05/2031   Pneumonia Vaccine 84+ Years old  Completed   INFLUENZA VACCINE  Completed   DEXA SCAN  Completed   Hepatitis C Screening  Completed   Zoster Vaccines- Shingrix  Completed   HPV VACCINES  Aged Out   COLONOSCOPY (Pts 45-62yrs Insurance coverage will need to be confirmed)  Discontinued   Health Maintenance Review LIFESTYLE:  Exercise:   pretty good  bar classes ans walks a lot 5 miles  Tobacco/ETS: n Alcohol:  n Sugar beverages: no Sleep:   Drug use: no HH of    2  1  dog   peggy    Covid July  21 and 22 .  Like a cold   and no se of initial vaccine.    ROS:  REST of 12 system review negative except as per HPI   Past Medical History:  Diagnosis Date   Cataract 10/18/2011   Followed by opthalmology.    COLONIC POLYPS, HX OF 10/12/2009   COVID-19 03/2020   HYPERLIPIDEMIA 05/31/2007   NEPHROLITHIASIS, HX OF 05/31/2007    Past Surgical History:  Procedure Laterality Date   ATRIAL FIBRILLATION ABLATION N/A 12/10/2020   Procedure: ATRIAL FIBRILLATION ABLATION;  Surgeon: Lanier Prude, MD;  Location: [facility];  Service: Cardiovascular;  Laterality: N/A;   CARDIOVERSION N/A 08/15/2020   Procedure: CARDIOVERSION;  Surgeon: Meriam Sprague, MD;  Location: [facility] ENDOSCOPY;  Service: Cardiovascular;  Laterality: N/A;    CATARACT EXTRACTION, BILATERAL      Family History  Problem Relation Age of Onset   Atrial fibrillation Mother        Has pacer doing very well age 35 passed suddnely age 89 poss CV    Social History   Socioeconomic History   Marital status: Single    Spouse name: Not on file   Number of children: Not on file   Years of education: Not on file   Highest education level: Not on file  Occupational History   Occupation: Retired  Tobacco Use   Smoking status: Former    Years: 2.00    Types: Cigarettes    Quit date: 09/08/1964    Years since quitting: 57.0   Smokeless tobacco: Never  Vaping Use   Vaping Use: Never used  Substance and Sexual Activity   Alcohol use: Yes    Comment: occassional   Drug use: No   Sexual activity: Not on file  Other Topics Concern   Not on file  Social History Narrative   Retired Publishing rights manager    Non smoker   Works as Engineer, 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    H hof 2 1 cat neg tob      Social Determinants of Health   Financial Resource Strain: Low Risk  Difficulty of Paying Living Expenses: Not hard at all  Food Insecurity: No Food Insecurity   Worried About [place] in the Last Year: Never true   Ran Out of Food in the Last Year: Never true  Transportation Needs: No Transportation Needs   Lack of Transportation (Medical): No   Lack of Transportation (Non-Medical): No  Physical Activity: Insufficiently Active   Days of Exercise per Week: 7 days   Minutes of Exercise per Session: 20 min  Stress: No Stress Concern Present   Feeling of Stress : Not at all  Social Connections: Moderately Integrated   Frequency of Communication with Friends and Family: Three times a week   Frequency of Social Gatherings with Friends and Family: Three times a week   Attends Religious Services: Never   Active Member of Clubs or Organizations: Yes   Attends Music therapist: More than 4 times per year   Marital Status: Living with  partner    Outpatient Medications Prior to Visit  Medication Sig Dispense Refill   apixaban (ELIQUIS) 5 MG TABS tablet Take 1 tablet (5 mg total) by mouth 2 (two) times daily. 180 tablet 6   Artificial Tear Ointment (DRY EYES OP) Apply 1 drop to eye daily as needed (dry eyes).     Cholecalciferol (VITAMIN D) 2000 UNITS tablet Take 2,000 Units by mouth daily.     diclofenac Sodium (VOLTAREN) 1 % GEL Apply 1 application topically 4 (four) times daily as needed (pain.).      metoprolol succinate (TOPROL-XL) 50 MG 24 hr tablet TAKE 1 TABLET (50 MG TOTAL) BY MOUTH DAILY. TAKE WITH OR IMMEDIATELY FOLLOWING A MEAL. 90 tablet 0   gabapentin (NEURONTIN) 100 MG capsule Take 1 capsule (100 mg total) by mouth at bedtime. (Patient taking differently: Take 100 mg by mouth at bedtime as needed (pain).) 180 capsule 0   LUTEIN PO Take 1 tablet by mouth daily.     No facility-administered medications prior to visit.     EXAM:  BP 120/76 (BP Location: Left Arm, Patient Position: Sitting, Cuff Size: Normal)    Pulse (!) 54    Temp 97.7 F (36.5 C)    Ht 5\' 4"  (1.626 m)    Wt 132 lb 12.8 oz (60.2 kg)    SpO2 96%    BMI 22.80 kg/m   Body mass index is 22.8 kg/m. Wt Readings from Last 3 Encounters:  08/26/21 132 lb 12.8 oz (60.2 kg)  05/06/21 133 lb (60.3 kg)  04/12/21 131 lb (59.4 kg)    Physical Exam: Vital signs reviewed [id number] is a well-developed well-nourished alert cooperative    who appearsr stated age in no acute distress.  HEENT: normocephalic atraumatic , Eyes: PERRL EOM's full, conjunctiva clear, Nares: paten,t no deformity discharge or tenderness., Ears: no deformity EAC's clear TMs with normal landmarks. Mouth: masked  NECK: supple without masses, thyromegaly or bruits. CHEST/PULM:  Clear to auscultation and percussion breath sounds equal no wheeze , rales or rhonchi. No chest wall deformities or tenderness. Breast: normal by inspection . No dimpling, discharge, masses, tenderness or  discharge . CV: PMI is nondisplaced, S1 S2 no gallops, murmurs, rubs. Peripheral pulses are full without delay.No JVD .  ABDOMEN: Bowel sounds normal nontender  No guard or rebound, no hepato splenomegal no CVA tenderness.   Extremtities:  No clubbing cyanosis or edema, no acute joint swelling or redness no focal atrophy NEURO:  Oriented x3, cranial nerves 3-12 appear to be  intact, no obvious focal weakness,gait within normal limits no abnormal reflexes or asymmetrical SKIN: No acute rashes normal turgor, color, no bruising or petechiae. PSYCH: Oriented, good eye contact, no obvious depression anxiety, cognition and judgment appear normal. LN: no cervical axillary inguinal adenopathy  Lab Results  Component Value Date   WBC 4.1 11/19/2020   HGB 12.7 11/19/2020   HCT 37.6 11/19/2020   PLT 220 11/19/2020   GLUCOSE 86 11/19/2020   CHOL 256 (H) 10/17/2019   TRIG 86.0 10/17/2019   HDL 71.20 10/17/2019   LDLDIRECT 160.3 10/13/2011   LDLCALC 167 (H) 10/17/2019   ALT 20 07/28/2018   AST 30 07/28/2018   NA 138 11/19/2020   K 4.8 11/19/2020   CL 101 11/19/2020   CREATININE 0.76 11/19/2020   BUN 15 11/19/2020   CO2 23 11/19/2020   TSH 1.410 07/19/2020   HGBA1C 5.4 08/10/2020   HGBA1C 5.4 08/10/2020    BP Readings from Last 3 Encounters:  08/26/21 120/76  05/06/21 110/64  04/12/21 126/74    Lab plan with patient   ASSESSMENT AND PLAN:  Discussed the following assessment and plan:    ICD-10-CM   1. Visit for preventive health examination  Z00.00     2. Hyperlipidemia, unspecified hyperlipidemia type  99991111 Basic metabolic panel    CBC with Differential/Platelet    Hepatic function panel    Lipid panel    TSH    3. Hyperglycemia  123456 Basic metabolic panel    CBC with Differential/Platelet    Hepatic function panel    Lipid panel    TSH    4. Atrial fibrillation, unspecified type (HCC)  123456 Basic metabolic panel    CBC with Differential/Platelet    Hepatic  function panel    Lipid panel    TSH   s/-p ablation on anticoagulation    5. Medication management  123456 Basic metabolic panel    CBC with Differential/Platelet    Hepatic function panel    Lipid panel    TSH    6. Chronic anticoagulation  123456 Basic metabolic panel    CBC with Differential/Platelet    Hepatic function panel    Lipid panel    TSH    7. Colon cancer screening  Z12.11 Cologuard    8. Elevated coronary artery calcium score  A999333 Basic metabolic panel    CBC with Differential/Platelet    Hepatic function panel    Lipid panel    TSH   75 percentile    9. Atherosclerosis of aorta (HCC)  I70.0     Elevated coronary score reasonable to try 5 mg of rosuvastatin 3 days a week up to daily as tolerated. Plan fasting lab work in 3 to 4 months to include lipids. Blood pressure has been in good range Atrial fibs appears to be resolved after ablation (she feels that COVID triggered the atrial fib. Son also had age 6)  Return in about 1 year (around 08/26/2022) for fasting lab 3-4 months  and yearly depending on results .  Patient Care Team: Keigan Tafoya, Standley Brooking, MD as PCP - General Marlou Porch Thana Farr, MD as PCP - Cardiology (Cardiology) Vickie Epley, MD as PCP - Electrophysiology (Cardiology) Richmond Campbell, MD as Consulting Physician (Gastroenterology) Shon Hough, MD as Consulting Physician (Ophthalmology) Patient Instructions  Trial of crestor 5 mg per day ( can try 3 x per week and increase as tolerated)  Plan fasting lab in 3-4 months.  Will order  cologuard  with caveats  noted.   Good to see you today   Glad  you are doing well.   Plan fu depending on results and how doing  or yearly      Giordan Fordham K. Mliss Wedin M.D.

## 2021-08-26 NOTE — Patient Instructions (Addendum)
Trial of crestor 5 mg per day ( can try 3 x per week and increase as tolerated)  Plan fasting lab in 3-4 months.  Will order  cologuard with caveats  noted.   Good to see you today   Glad  you are doing well.   Plan fu depending on results and how doing  or yearly

## 2021-09-03 DIAGNOSIS — Z1211 Encounter for screening for malignant neoplasm of colon: Secondary | ICD-10-CM | POA: Diagnosis not present

## 2021-09-13 LAB — COLOGUARD: COLOGUARD: NEGATIVE

## 2021-09-13 NOTE — Progress Notes (Signed)
Cologuard is  negative  can chose to repeat in 3 years.

## 2021-09-18 DIAGNOSIS — M5411 Radiculopathy, occipito-atlanto-axial region: Secondary | ICD-10-CM | POA: Diagnosis not present

## 2021-09-18 DIAGNOSIS — Z1231 Encounter for screening mammogram for malignant neoplasm of breast: Secondary | ICD-10-CM | POA: Diagnosis not present

## 2021-09-18 DIAGNOSIS — M9901 Segmental and somatic dysfunction of cervical region: Secondary | ICD-10-CM | POA: Diagnosis not present

## 2021-09-18 LAB — HM MAMMOGRAPHY

## 2021-09-23 ENCOUNTER — Encounter: Payer: Self-pay | Admitting: Internal Medicine

## 2021-10-04 ENCOUNTER — Other Ambulatory Visit: Payer: Self-pay | Admitting: Internal Medicine

## 2021-10-05 IMAGING — DX DG FINGER RING 2+V*R*
3 series · 3 of 3 positions shown · non-contrast
Comparison: None.

CLINICAL DATA: Tuft fracture.

EXAM:
RIGHT RING FINGER 2+V

[x finger pa right]
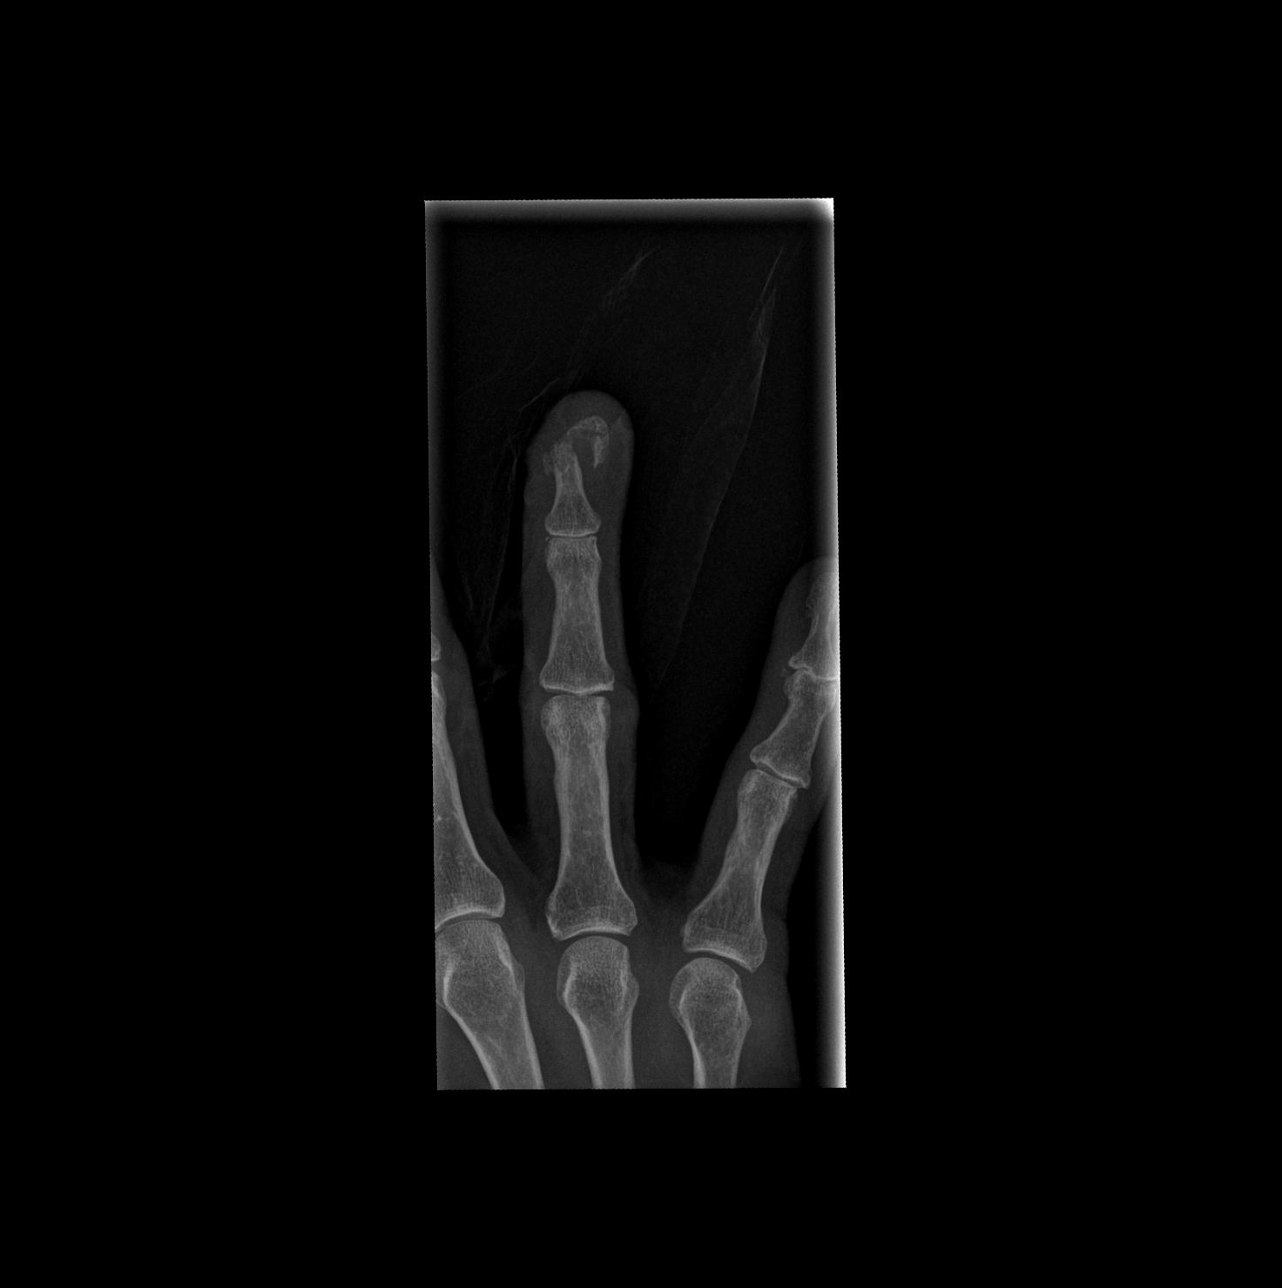

[x finger obl right]
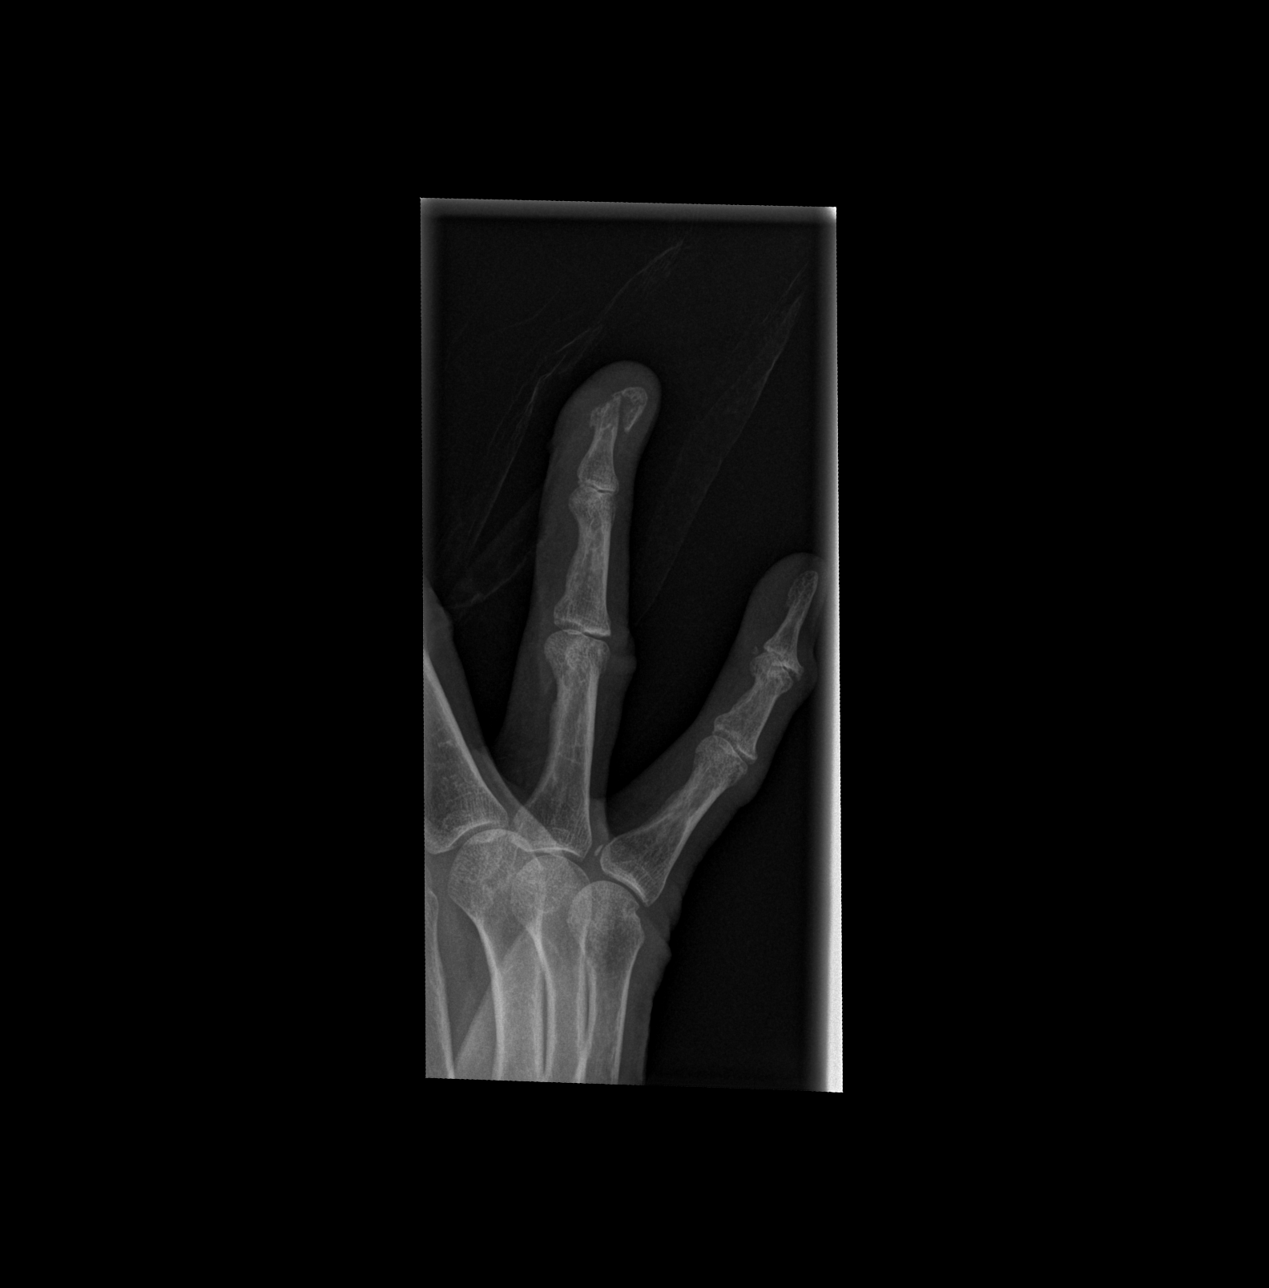

[x finger lat right]
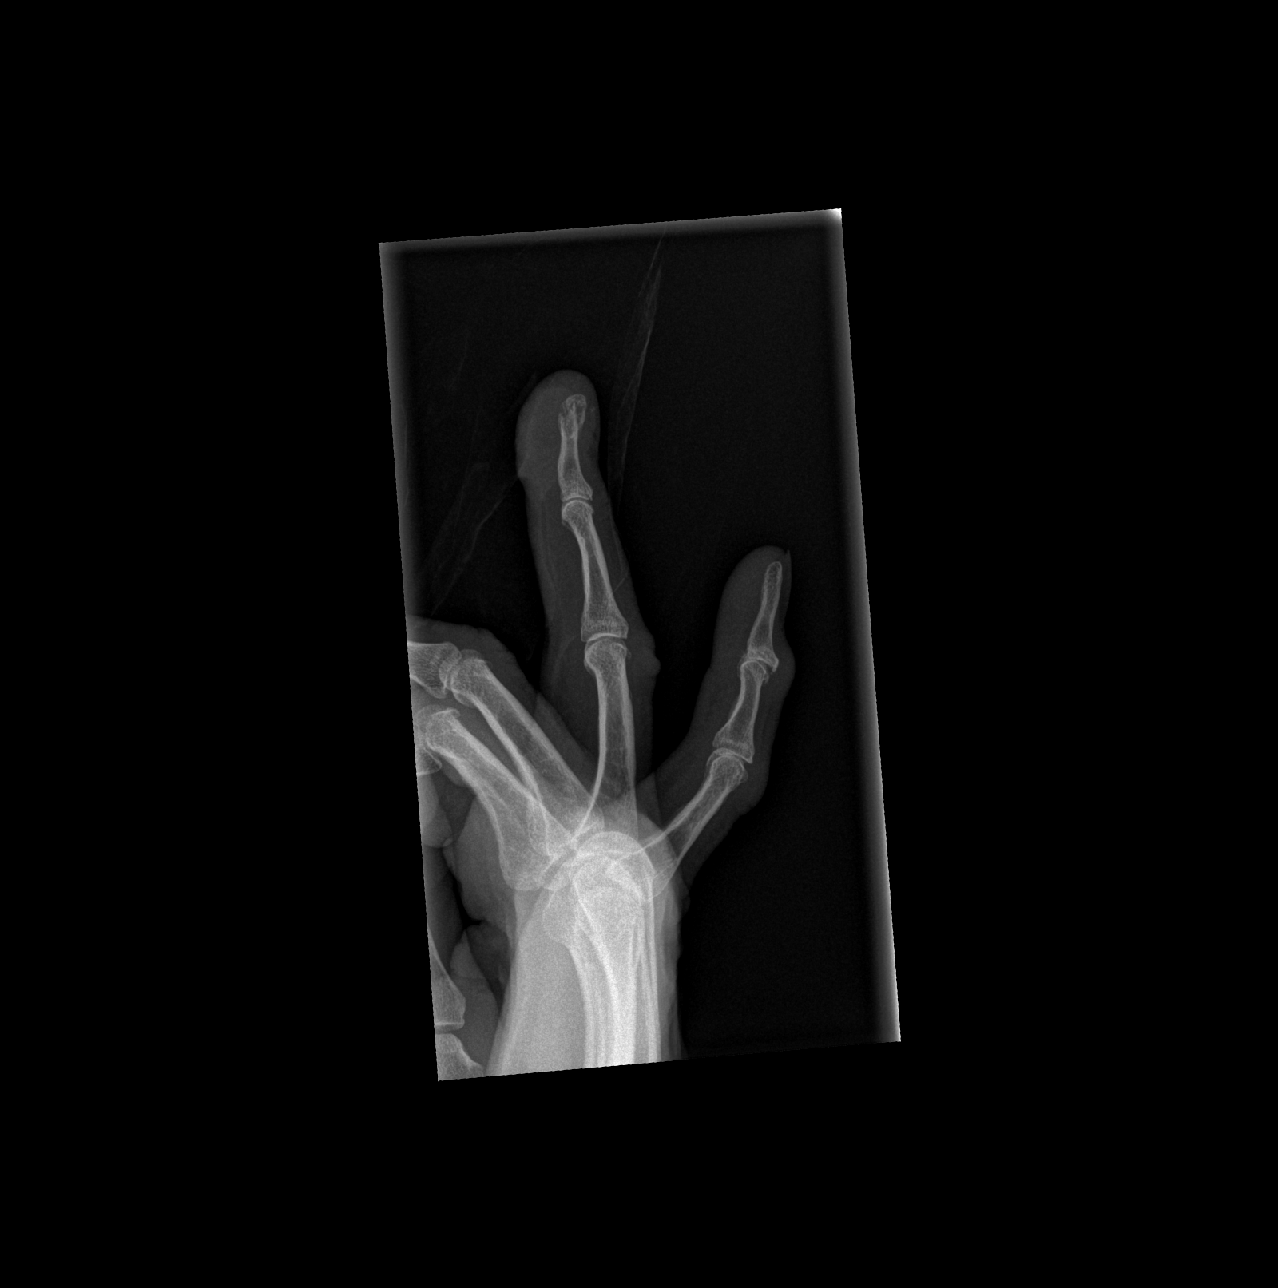

[3 of 3 positions shown; findings below may reference images not displayed]

FINDINGS: Displaced, mildly comminuted fracture of the tuft of the fourth
digit. Soft tissue swelling. Equivocal 2 mm radiopaque foreign
object about the dorsal aspect of the distal phalanx, likely about
the nailbed.
IMPRESSION: Displaced, comminuted tuft fracture as detailed above.

## 2021-10-11 ENCOUNTER — Encounter: Payer: Self-pay | Admitting: Internal Medicine

## 2021-10-14 NOTE — Telephone Encounter (Signed)
Hi Nichole Berg, Sorry you had a problem taking low dose crestor.5 mg per day .  We can try atorvastin 10 mg 3 d per week  and increase if tolerated  Or pravastatin 20 mg per day  Also could try  crestor 3 d per week   .    make sure you are getting enough VITD  which may prevent statin sx problem in some people.    What  direction are you interested .?  You could also speak with cardiology about next best path  to help with lipids and elevated CAC scoring.  WP

## 2021-10-16 DIAGNOSIS — M5411 Radiculopathy, occipito-atlanto-axial region: Secondary | ICD-10-CM | POA: Diagnosis not present

## 2021-10-16 DIAGNOSIS — M9901 Segmental and somatic dysfunction of cervical region: Secondary | ICD-10-CM | POA: Diagnosis not present

## 2021-10-29 ENCOUNTER — Other Ambulatory Visit: Payer: Self-pay | Admitting: Cardiology

## 2021-10-29 NOTE — Telephone Encounter (Signed)
Pt last saw Dr Quentin Ore 04/12/21, last labs 11/19/20 Creat 0.76, age 76, weight 60.2, based on specified criteria pt is on appropriate dosage of Eliquis 5mg  BID for afib.  Will refill rx.

## 2021-11-19 ENCOUNTER — Other Ambulatory Visit: Payer: Self-pay

## 2021-11-19 ENCOUNTER — Ambulatory Visit: Payer: Medicare PPO | Admitting: Cardiology

## 2021-11-19 ENCOUNTER — Encounter: Payer: Self-pay | Admitting: Cardiology

## 2021-11-19 VITALS — BP 118/72 | HR 64 | Ht 64.0 in | Wt 133.6 lb

## 2021-11-19 DIAGNOSIS — I2584 Coronary atherosclerosis due to calcified coronary lesion: Secondary | ICD-10-CM

## 2021-11-19 DIAGNOSIS — I4819 Other persistent atrial fibrillation: Secondary | ICD-10-CM

## 2021-11-19 DIAGNOSIS — I2510 Atherosclerotic heart disease of native coronary artery without angina pectoris: Secondary | ICD-10-CM | POA: Diagnosis not present

## 2021-11-19 DIAGNOSIS — D6869 Other thrombophilia: Secondary | ICD-10-CM | POA: Diagnosis not present

## 2021-11-19 DIAGNOSIS — E785 Hyperlipidemia, unspecified: Secondary | ICD-10-CM

## 2021-11-19 DIAGNOSIS — Z79899 Other long term (current) drug therapy: Secondary | ICD-10-CM

## 2021-11-19 DIAGNOSIS — Z789 Other specified health status: Secondary | ICD-10-CM

## 2021-11-19 NOTE — Patient Instructions (Signed)
Medication Instructions:  ? ?Your physician recommends that you continue on your current medications as directed. Please refer to the Current Medication list given to you today. ? ?*If you need a refill on your cardiac medications before your next appointment, please call your pharmacy* ? ? ?You have been referred to OUR LIPID CLINIC HERE IN THE OFFICE TO SEE OUR PHARMACIST ? ? ?Follow-Up: ?At [facility], you and your health needs are our priority.  As part of our continuing mission to provide you with exceptional heart care, we have created designated Provider Care Teams.  These Care Teams include your primary Cardiologist (physician) and Advanced Practice Providers (APPs -  Physician Assistants and Nurse Practitioners) who all work together to provide you with the care you need, when you need it. ? ?We recommend signing up for the patient portal called "MyChart".  Sign up information is provided on this After Visit Summary.  MyChart is used to connect with patients for Virtual Visits (Telemedicine).  Patients are able to view lab/test results, encounter notes, upcoming appointments, etc.  Non-urgent messages can be sent to your provider as well.   ?To learn more about what you can do with MyChart, go to [email or web address].   ? ?Your next appointment:   ?1 year(s) ? ?The format for your next appointment:   ?In Person ? ?Provider:   ?Donato Schultz, MD { ? ? ?

## 2021-11-19 NOTE — Assessment & Plan Note (Signed)
Currently on Eliquis.  She did ask about if she would be able to come off of this medication.  I relayed to her that atrial fibrillation return could happen unexpectedly.  She will discuss further with Dr. Quentin Ore.  We also discussed Watchman device.  I showed her pictures of the left atrial appendage on the CT scan. ?

## 2021-11-19 NOTE — Progress Notes (Signed)
?Cardiology Office Note:   ? ?Date:  11/19/2021  ? ?ID:  Nichole Berg, DOB 01/07/1946, MRN [id number] ? ?PCP:  Burnis Medin, MD ?  ?[place] [facility] Providers ?Cardiologist:  Candee Furbish, MD ?Electrophysiologist:  Vickie Epley, MD    ? ?Referring MD: Burnis Medin, MD  ? ? ?History of Present Illness:   ? ?Nichole Berg is a 76 y.o. female here for follow-up of paroxysmal atrial fibrillation status post ablation by Dr. Quentin Ore on 12/10/2020.  Vein isolation and posterior wall were isolated.  She will occasionally have a PVC that can she feel but has explained to Dr. Quentin Ore in the past these are not too big of a nuisance.  She has continued Eliquis for stroke prophylaxis. ? ?She is a retired Designer, jewellery. ?Had [place] in July 2021, previously vaccinated ? ?Back in October 2021 she was diagnosed with atrial fibrillation after feeling palpitations and dizziness off and on.  Her BP cuff registered over 160 heart rate.  No chest pain ? ?Very rare alcohol use.  She used to have palpitations from her coffee in her 30s. ? ?Her mother had atrial fibrillation in her 80s. ?Her son was diagnosed with A-fib as well ?Her father-CHF ? ?She tried Crestor 5 mg twice a week but then developed headaches and muscle fatigue.  She stopped and after 2 weeks the symptoms went away.  This was utilized because of coronary calcium elevated as below. ? ?Past Medical History:  ?Diagnosis Date  ? Cataract 10/18/2011  ? Followed by opthalmology.   ? COLONIC POLYPS, HX OF 10/12/2009  ? COVID-19 03/2020  ? HYPERLIPIDEMIA 05/31/2007  ? NEPHROLITHIASIS, HX OF 05/31/2007  ? ? ?Past Surgical History:  ?Procedure Laterality Date  ? ATRIAL FIBRILLATION ABLATION N/A 12/10/2020  ? Procedure: ATRIAL FIBRILLATION ABLATION;  Surgeon: Vickie Epley, MD;  Location: [place] CV LAB;  Service: Cardiovascular;  Laterality: N/A;  ? CARDIOVERSION N/A 08/15/2020  ? Procedure: CARDIOVERSION;  Surgeon: Freada Bergeron, MD;  Location: [facility] ENDOSCOPY;  Service:  Cardiovascular;  Laterality: N/A;  ? CATARACT EXTRACTION, BILATERAL    ? ? ?Current Medications: ?Current Meds  ?Medication Sig  ? apixaban (ELIQUIS) 5 MG TABS tablet TAKE 1 TABLET TWICE DAILY  ? Artificial Tear Ointment (DRY EYES OP) Apply 1 drop to eye daily as needed (dry eyes).  ? Cholecalciferol (VITAMIN D) 2000 UNITS tablet Take 2,000 Units by mouth daily.  ? gabapentin (NEURONTIN) 100 MG capsule Take 1 capsule (100 mg total) by mouth at bedtime. (Patient taking differently: Take 100 mg by mouth at bedtime as needed (pain).)  ? LUTEIN PO Take 1 tablet by mouth daily.  ? metoprolol succinate (TOPROL-XL) 50 MG 24 hr tablet TAKE 1 TABLET (50 MG TOTAL) BY MOUTH DAILY. TAKE WITH OR IMMEDIATELY FOLLOWING A MEAL.  ?  ? ?Allergies:   Sulfonamide derivatives  ? ?Social History  ? ?Socioeconomic History  ? Marital status: Single  ?  Spouse name: Not on file  ? Number of children: Not on file  ? Years of education: Not on file  ? Highest education level: Not on file  ?Occupational History  ? Occupation: Retired  ?Tobacco Use  ? Smoking status: Former  ?  Years: 2.00  ?  Types: Cigarettes  ?  Quit date: 09/08/1964  ?  Years since quitting: 57.2  ? Smokeless tobacco: Never  ?Vaping Use  ? Vaping Use: Never used  ?Substance and Sexual Activity  ? Alcohol use: Yes  ?  Comment: occassional  ? Drug use: No  ? Sexual activity: Not on file  ?Other Topics Concern  ? Not on file  ?Social History Narrative  ? Retired Designer, jewellery   ? Non smoker  ? 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 H hof 2 1 cat neg tob  ?   ? ?Social Determinants of Health  ? ?Financial Resource Strain: Low Risk   ? Difficulty of Paying Living Expenses: Not hard at all  ?Food Insecurity: No Food Insecurity  ? Worried About [place] fundraiser in the Last Year: Never true  ? Ran Out of Food in the Last Year: Never true  ?Transportation Needs: No Transportation Needs  ? Lack of Transportation (Medical): No  ? Lack of Transportation (Non-Medical): No   ?Physical Activity: Insufficiently Active  ? Days of Exercise per Week: 7 days  ? Minutes of Exercise per Session: 20 min  ?Stress: No Stress Concern Present  ? Feeling of Stress : Not at all  ?Social Connections: Moderately Integrated  ? Frequency of Communication with Friends and Family: Three times a week  ? Frequency of Social Gatherings with Friends and Family: Three times a week  ? Attends Religious Services: Never  ? Active Member of Clubs or Organizations: Yes  ? Attends Archivist Meetings: More than 4 times per year  ? Marital Status: Living with partner  ?  ? ?Family History: ?The patient's family history includes Atrial fibrillation in her mother. ? ?ROS:   ?Please see the history of present illness.    ? All other systems reviewed and are negative. ? ?EKGs/Labs/Other Studies Reviewed:   ? ?The following studies were reviewed today: ?ECHO 08/10/20: ? ? 1. Left ventricular ejection fraction, by estimation, is 60 to 65%. The  ?left ventricle has normal function. The left ventricle has no regional  ?wall motion abnormalities. Left ventricular diastolic parameters are  ?indeterminate.  ? 2. Right ventricular systolic function is normal. The right ventricular  ?size is normal. There is normal pulmonary artery systolic pressure.  ? 3. Left atrial size was moderately dilated.  ? 4. Right atrial size was mildly dilated.  ? 5. The mitral valve is degenerative. Mild mitral valve regurgitation. No  ?evidence of mitral stenosis. Moderate mitral annular calcification.  ? 6. Tricuspid valve regurgitation is mild to moderate.  ? 7. The aortic valve is tricuspid. Aortic valve regurgitation is not  ?visualized. Mild to moderate aortic valve sclerosis/calcification is  ?present, without any evidence of aortic stenosis.  ? 8. The inferior vena cava is normal in size with greater than 50%  ?respiratory variability, suggesting right atrial pressure of 3 mmHg. ? ?12/03/2020-PV study ? ?Coronary artery calcium score  is 286, which is 75 percentile for ?age and sex matched peers. ? ?EKG:  EKG is  ordered today.  The ekg ordered today demonstrates sinus bradycardia 51 ? ?Recent Labs: ?11/19/2020: BUN 15; Creatinine, Ser 0.76; Hemoglobin 12.7; Platelets 220; Potassium 4.8; Sodium 138  ?Recent Lipid Panel ?   ?Component Value Date/Time  ? CHOL 256 (H) 10/17/2019 1058  ? TRIG 86.0 10/17/2019 1058  ? HDL 71.20 10/17/2019 1058  ? CHOLHDL 4 10/17/2019 1058  ? VLDL 17.2 10/17/2019 1058  ? LDLCALC 167 (H) 10/17/2019 1058  ? LDLDIRECT 160.3 10/13/2011 1058  ? ? ? ?   ? ?Physical Exam:   ? ?VS:  BP 118/72 (BP Location: Left Arm)   Pulse 64   Ht 5\' 4"  (  1.626 m)   Wt 133 lb 9.6 oz (60.6 kg)   SpO2 98%   BMI 22.93 kg/m?    ? ?Wt Readings from Last 3 Encounters:  ?11/19/21 133 lb 9.6 oz (60.6 kg)  ?08/26/21 132 lb 12.8 oz (60.2 kg)  ?05/06/21 133 lb (60.3 kg)  ?  ? ?GEN:  Well nourished, well developed in no acute distress ?HEENT: Normal ?NECK: No JVD; No carotid bruits ?LYMPHATICS: No lymphadenopathy ?CARDIAC: Bradycardic regular, no murmurs, no rubs, gallops ?RESPIRATORY:  Clear to auscultation without rales, wheezing or rhonchi  ?ABDOMEN: Soft, non-tender, non-distended ?MUSCULOSKELETAL:  No edema; No deformity  ?SKIN: Warm and dry ?NEUROLOGIC:  Alert and oriented x 3 ?PSYCHIATRIC:  Normal affect  ? ?ASSESSMENT:   ? ?1. Statin intolerance   ?2. Coronary artery calcification   ?3. Medication management   ?4. Hyperlipidemia, unspecified hyperlipidemia type   ?5. Secondary hypercoagulable state ([place])   ?6. Persistent atrial fibrillation ([place])   ? ?PLAN:   ? ?In order of problems listed above: ? ?Coronary artery calcification ?Calcium score is 286, 75th percentile.  Prior LDL 167.  Patient states that she stopped her Crestor 5 mg 1 month ago.  She tried 2 days a week but she ended up having a headache and felt muscle weakness.  After 2 weeks the symptoms went away.  I will have her set up with our lipid clinic to discuss alternatives such as  Repatha etc.  I discussed with her her incremental risk.  Showed her the pictures, LAD calcification.  If she has any anginal type symptoms, she will let us know and we will further investigate. ? ?Secondary

## 2021-11-19 NOTE — Assessment & Plan Note (Signed)
Currently doing well in sinus bradycardia.  We discussed pulling back the metoprolol from 50-25.  She is okay staying on the 50.  Heart rate 51 bpm.  No symptoms. ?

## 2021-11-19 NOTE — Assessment & Plan Note (Addendum)
Calcium score is 286, 75th percentile.  Prior LDL 167.  Patient states that she stopped her Crestor 5 mg 1 month ago.  She tried 2 days a week but she ended up having a headache and felt muscle weakness.  After 2 weeks the symptoms went away.  I will have her set up with our lipid clinic to discuss alternatives such as Repatha etc.  I discussed with her her incremental risk.  Showed her the pictures, LAD calcification.  If she has any anginal type symptoms, she will let us know and we will further investigate. ?

## 2021-11-20 ENCOUNTER — Encounter: Payer: Self-pay | Admitting: Internal Medicine

## 2021-11-20 ENCOUNTER — Encounter: Payer: Self-pay | Admitting: Cardiology

## 2021-11-21 ENCOUNTER — Other Ambulatory Visit: Payer: Self-pay | Admitting: *Deleted

## 2021-11-21 MED ORDER — METOPROLOL SUCCINATE ER 25 MG PO TB24: 25.0000 mg | ORAL_TABLET | Freq: Every day | ORAL | 1 refills | Status: DC

## 2021-11-21 NOTE — Progress Notes (Signed)
Sounds good to me ?Donato Schultz, MD ?  ? ? ?[name]  [facility] Triage (supporting Jake Bathe, MD) Yesterday (9:22 AM)  ? ?Good morning Dr. Anne Fu, ?After we discussed lowering the metropolol  ER succinate, I think I would like to try one month of the 25 mgm. If all is well I can then get the 90 day supply from the [facility].  Other wise I will continue with the 50 Mgm.If you agree please call the 30 day 25mg m Metropolol Succinate to the .  ?Thanks so much.  Danean ? ?RX sent into pharmacy of choice as requested.    ? ?Metoprolol succinate 50 mg tablet will be discontinued once pt reports how she is doing with the 25 mg dose. ? ?

## 2021-11-24 NOTE — Telephone Encounter (Unsigned)
Hi  Nichole Berg  ? ?the future labs  orders are in our system ; you just have to make a lab appt   lab orders  include a   LIPID panel .  ?Thanks WP ? ? ? ?

## 2021-12-03 ENCOUNTER — Other Ambulatory Visit (INDEPENDENT_AMBULATORY_CARE_PROVIDER_SITE_OTHER): Payer: Medicare PPO

## 2021-12-03 DIAGNOSIS — Z7901 Long term (current) use of anticoagulants: Secondary | ICD-10-CM | POA: Diagnosis not present

## 2021-12-03 DIAGNOSIS — I4891 Unspecified atrial fibrillation: Secondary | ICD-10-CM

## 2021-12-03 DIAGNOSIS — R739 Hyperglycemia, unspecified: Secondary | ICD-10-CM

## 2021-12-03 DIAGNOSIS — Z79899 Other long term (current) drug therapy: Secondary | ICD-10-CM

## 2021-12-03 DIAGNOSIS — E785 Hyperlipidemia, unspecified: Secondary | ICD-10-CM

## 2021-12-03 DIAGNOSIS — R931 Abnormal findings on diagnostic imaging of heart and coronary circulation: Secondary | ICD-10-CM | POA: Diagnosis not present

## 2021-12-03 LAB — CBC WITH DIFFERENTIAL/PLATELET
Basophils Absolute: 0 10*3/uL (ref 0.0–0.1)
Basophils Relative: 0.5 % (ref 0.0–3.0)
Eosinophils Absolute: 0 10*3/uL (ref 0.0–0.7)
Eosinophils Relative: 0.7 % (ref 0.0–5.0)
HCT: 36.5 % (ref 36.0–46.0)
Hemoglobin: 12 g/dL (ref 12.0–15.0)
Lymphocytes Relative: 27.5 % (ref 12.0–46.0)
Lymphs Abs: 1.4 10*3/uL (ref 0.7–4.0)
MCHC: 32.9 g/dL (ref 30.0–36.0)
MCV: 93.6 fl (ref 78.0–100.0)
Monocytes Absolute: 0.6 10*3/uL (ref 0.1–1.0)
Monocytes Relative: 12.2 % — ABNORMAL HIGH (ref 3.0–12.0)
Neutro Abs: 3 10*3/uL (ref 1.4–7.7)
Neutrophils Relative %: 59.1 % (ref 43.0–77.0)
Platelets: 231 10*3/uL (ref 150.0–400.0)
RBC: 3.9 Mil/uL (ref 3.87–5.11)
RDW: 12.8 % (ref 11.5–15.5)
WBC: 5 10*3/uL (ref 4.0–10.5)

## 2021-12-03 LAB — LIPID PANEL
Cholesterol: 237 mg/dL — ABNORMAL HIGH (ref 0–200)
HDL: 70.4 mg/dL (ref >39.00)
LDL Cholesterol: 153 mg/dL — ABNORMAL HIGH (ref 0–99)
NonHDL: 166.39
Total CHOL/HDL Ratio: 3
Triglycerides: 66 mg/dL (ref 0.0–149.0)
VLDL: 13.2 mg/dL (ref 0.0–40.0)

## 2021-12-03 LAB — HEPATIC FUNCTION PANEL
ALT: 18 U/L (ref 0–35)
AST: 25 U/L (ref 0–37)
Albumin: 4.5 g/dL (ref 3.5–5.2)
Alkaline Phosphatase: 88 U/L (ref 39–117)
Bilirubin, Direct: 0.2 mg/dL (ref 0.0–0.3)
Total Bilirubin: 0.9 mg/dL (ref 0.2–1.2)
Total Protein: 7.2 g/dL (ref 6.0–8.3)

## 2021-12-03 LAB — BASIC METABOLIC PANEL
BUN: 15 mg/dL (ref 6–23)
CO2: 27 mEq/L (ref 19–32)
Calcium: 9.4 mg/dL (ref 8.4–10.5)
Chloride: 102 mEq/L (ref 96–112)
Creatinine, Ser: 0.67 mg/dL (ref 0.40–1.20)
GFR: 83.92 mL/min (ref >60.00)
Glucose, Bld: 88 mg/dL (ref 70–99)
Potassium: 4.2 mEq/L (ref 3.5–5.1)
Sodium: 137 mEq/L (ref 135–145)

## 2021-12-03 LAB — TSH: TSH: 1.54 u[IU]/mL (ref 0.35–5.50)

## 2021-12-05 NOTE — Progress Notes (Signed)
Some improvement  lipids  forwarding results to cardiology Dr Anne Fu

## 2021-12-06 NOTE — Progress Notes (Signed)
Noted  

## 2021-12-10 ENCOUNTER — Ambulatory Visit: Payer: Medicare PPO | Admitting: Pharmacist

## 2021-12-10 DIAGNOSIS — E785 Hyperlipidemia, unspecified: Secondary | ICD-10-CM | POA: Diagnosis not present

## 2021-12-10 DIAGNOSIS — I2584 Coronary atherosclerosis due to calcified coronary lesion: Secondary | ICD-10-CM | POA: Diagnosis not present

## 2021-12-10 DIAGNOSIS — I2510 Atherosclerotic heart disease of native coronary artery without angina pectoris: Secondary | ICD-10-CM

## 2021-12-10 MED ORDER — PRAVASTATIN SODIUM 20 MG PO TABS: ORAL_TABLET | ORAL | 5 refills | Status: DC

## 2021-12-10 MED ORDER — METOPROLOL SUCCINATE ER 25 MG PO TB24: 25.0000 mg | ORAL_TABLET | Freq: Every day | ORAL | 3 refills | Status: DC

## 2021-12-10 NOTE — Patient Instructions (Addendum)
It was nice to meet you today! ? ?Your LDL cholesterol is 153 and your goal is < 70 ? ?Start pravastatin 1/2 tablet daily and increase to 1 tablet daily if tolerated well ? ?Call Aundra Millet, PharmD with any tolerability issues or updates #[phone] ? ?

## 2021-12-10 NOTE — Progress Notes (Signed)
Patient ID: Nichole Berg                 DOB: 22-Dec-1945                    MRN: [id number] ? ? ? ? ?HPI: ?Nichole Berg is a 76 y.o. female patient referred to lipid clinic by Dr Marlou Porch. PMH is significant for PAF diagnosed in Oct 2021 after COVID infection, now s/p ablation 12/2020. Echo 08/2020 showed normal EF 60-65%. Coronary artery calcium score was elevated at 286 12/03/20 which was 75th percentile for age and sex matched controls. She is previously intolerant to to rosuvastatin 5mg  daily and twice weekly and was referred to lipid clinic to discuss other options. ? ?Pt previously tried Crestor 5 mg 3x week but then developed headaches and muscle fatigue within 2 weeks. Took for a month then stopped and sx resolved within 2 weeks. She has not tried other lipid lowering therapy. Her partner started Repatha a month ago and has been tolerating it well. Hesitant about statin-related side effects. Feeling well since decreasing her Toprol from 50mg  to 25mg  daily. ? ?Current Medications: none ?Intolerances: rosuvastatin 5mg  3x weekly and daily - headaches and myalgias ?Risk Factors: elevated CAC ?LDL goal: 70mg /dL ? ?Family History: Afib in her mother, father had CHF, son has afib. ? ?Social History: Retired NP. Prior tobacco use, quit in 1966, occasional alcohol use, no drug use. ? ?Labs: ?12/03/21: TC 237, TG 66, HDL 70.4, LDL 153 (no LLT) ? ?Past Medical History:  ?Diagnosis Date  ? Cataract 10/18/2011  ? Followed by opthalmology.   ? COLONIC POLYPS, HX OF 10/12/2009  ? COVID-19 03/2020  ? HYPERLIPIDEMIA 05/31/2007  ? NEPHROLITHIASIS, HX OF 05/31/2007  ? ? ?Current Outpatient Medications on File Prior to Visit  ?Medication Sig Dispense Refill  ? apixaban (ELIQUIS) 5 MG TABS tablet TAKE 1 TABLET TWICE DAILY 180 tablet 1  ? Artificial Tear Ointment (DRY EYES OP) Apply 1 drop to eye daily as needed (dry eyes).    ? Cholecalciferol (VITAMIN D) 2000 UNITS tablet Take 2,000 Units by mouth daily.    ? diclofenac Sodium (VOLTAREN) 1 %  GEL Apply 1 application topically 4 (four) times daily as needed (pain.).     ? gabapentin (NEURONTIN) 100 MG capsule Take 1 capsule (100 mg total) by mouth at bedtime. (Patient taking differently: Take 100 mg by mouth at bedtime as needed (pain).) 180 capsule 0  ? LUTEIN PO Take 1 tablet by mouth daily.    ? metoprolol succinate (TOPROL-XL) 25 MG 24 hr tablet Take 1 tablet (25 mg total) by mouth daily. 30 tablet 1  ? metoprolol succinate (TOPROL-XL) 50 MG 24 hr tablet TAKE 1 TABLET (50 MG TOTAL) BY MOUTH DAILY. TAKE WITH OR IMMEDIATELY FOLLOWING A MEAL. 90 tablet 0  ? ?No current facility-administered medications on file prior to visit.  ? ? ?Allergies  ?Allergen Reactions  ? Sulfonamide Derivatives Itching and Rash  ? ? ?Assessment/Plan: ? ?1. Hyperlipidemia - Baseline LDL 153 above goal < 70 due to elevated calcium score. She is intolerant to low dose/frequency rosuvastatin. Discussed statin rechallenge, ezetimibe, Nexletol and PCSK9i options today. Her insurance does not cover PCSK9i without trial of at least 2 statins. Will rechallenge with low dose pravastatin 20mg  daily - pt aware to cut tablets in half for the first week or two to see how she tolerates it first. If she tolerates well, will increase to max tolerated statin and can add  on ezetimibe as needed to bring LDL to goal. If she experiences side effects on low dose pravastatin, will submit prior authorization request for Repatha. Plan to recheck lipids 8 weeks after she's on established lipid lowering therapy. ? ?Nichole Berg E. Purnell Daigle, PharmD, BCACP, CPP ?[id number] [place],  [address] ?Phone: [phone]; Fax: [phone] ?12/10/2021 4:09 PM ? ? ? ?

## 2022-01-07 ENCOUNTER — Telehealth: Payer: Self-pay | Admitting: Pharmacist

## 2022-01-07 DIAGNOSIS — M5411 Radiculopathy, occipito-atlanto-axial region: Secondary | ICD-10-CM | POA: Diagnosis not present

## 2022-01-07 DIAGNOSIS — M9901 Segmental and somatic dysfunction of cervical region: Secondary | ICD-10-CM | POA: Diagnosis not present

## 2022-01-07 NOTE — Telephone Encounter (Signed)
Left message for pt to follow up with pravastatin tolerability. ? ?Med plan noted in 4/4 office visit. ?

## 2022-01-08 NOTE — Telephone Encounter (Signed)
Pt returned call to clinic. She took 1/2 tablet for 10 days, then increased to the full tablet of 20mg  daily. She reports tolerating med well but does not wish to titrate her dose yet. Discussed adding on Zetia, she does not wish to add on new medication either. Eventually agreeable to try increasing her dose to 40mg  in the next few weeks. States the pharmacy filled a 90 day supply even though rx was specifically sent in for 30 day supply so we could reassess tolerability. She will call with an update a few weeks after increasing her dose, can recheck labs at that time or discuss adding on Zetia again. ?

## 2022-01-28 ENCOUNTER — Telehealth: Payer: Self-pay | Admitting: Pharmacist

## 2022-01-28 DIAGNOSIS — I2510 Atherosclerotic heart disease of native coronary artery without angina pectoris: Secondary | ICD-10-CM

## 2022-01-28 MED ORDER — PRAVASTATIN SODIUM 20 MG PO TABS: 20.0000 mg | ORAL_TABLET | Freq: Every day | ORAL | 5 refills | Status: DC

## 2022-01-28 NOTE — Telephone Encounter (Signed)
Pt called clinic stating she did not tolerate pravastatin 40mg  daily. Reports it gave her a bad headache and "didn't feel well." Then stopped her pravastatin altogether. Advised her to resume at 20mg  daily which she has tolerated well. She wishes to recheck her cholesterol before making any other med changes. Will recheck lipids in 4-6 weeks and again discuss adding on Zetia at that time.

## 2022-02-10 DIAGNOSIS — M5411 Radiculopathy, occipito-atlanto-axial region: Secondary | ICD-10-CM | POA: Diagnosis not present

## 2022-02-10 DIAGNOSIS — M9901 Segmental and somatic dysfunction of cervical region: Secondary | ICD-10-CM | POA: Diagnosis not present

## 2022-02-17 NOTE — Telephone Encounter (Signed)
Patient restarted pravastatin but has now discontinued again due to headaches. Does not want to be on statins anymore.  Called to discuss PCSK9i which she is also hesitant about.  Advised Repatha/Praluent is typically well tolerated and shows good lipid lowering results.  Patient remains hesitant.

## 2022-03-03 ENCOUNTER — Encounter: Payer: Self-pay | Admitting: Cardiology

## 2022-03-06 ENCOUNTER — Other Ambulatory Visit: Payer: Medicare PPO

## 2022-03-12 DIAGNOSIS — M9901 Segmental and somatic dysfunction of cervical region: Secondary | ICD-10-CM | POA: Diagnosis not present

## 2022-03-12 DIAGNOSIS — M5411 Radiculopathy, occipito-atlanto-axial region: Secondary | ICD-10-CM | POA: Diagnosis not present

## 2022-03-17 ENCOUNTER — Encounter: Payer: Self-pay | Admitting: Cardiology

## 2022-03-18 ENCOUNTER — Other Ambulatory Visit (HOSPITAL_COMMUNITY): Payer: Self-pay | Admitting: Physician Assistant

## 2022-03-18 ENCOUNTER — Ambulatory Visit (HOSPITAL_COMMUNITY)
Admission: RE | Admit: 2022-03-18 | Discharge: 2022-03-18 | Disposition: A | Discharge status: Home / Self Care | Payer: Medicare PPO | Source: Ambulatory Visit | Attending: Physician Assistant | Admitting: Physician Assistant

## 2022-03-18 ENCOUNTER — Encounter (HOSPITAL_COMMUNITY): Payer: Self-pay | Admitting: Physician Assistant

## 2022-03-18 VITALS — BP 104/80 | HR 123 | Ht 64.0 in | Wt 130.0 lb

## 2022-03-18 DIAGNOSIS — R5383 Other fatigue: Secondary | ICD-10-CM | POA: Diagnosis not present

## 2022-03-18 DIAGNOSIS — I484 Atypical atrial flutter: Secondary | ICD-10-CM | POA: Insufficient documentation

## 2022-03-18 DIAGNOSIS — E785 Hyperlipidemia, unspecified: Secondary | ICD-10-CM | POA: Insufficient documentation

## 2022-03-18 DIAGNOSIS — D6869 Other thrombophilia: Secondary | ICD-10-CM | POA: Diagnosis not present

## 2022-03-18 DIAGNOSIS — Z79899 Other long term (current) drug therapy: Secondary | ICD-10-CM | POA: Diagnosis not present

## 2022-03-18 DIAGNOSIS — I10 Essential (primary) hypertension: Secondary | ICD-10-CM | POA: Insufficient documentation

## 2022-03-18 DIAGNOSIS — Z7901 Long term (current) use of anticoagulants: Secondary | ICD-10-CM | POA: Diagnosis not present

## 2022-03-18 DIAGNOSIS — I2510 Atherosclerotic heart disease of native coronary artery without angina pectoris: Secondary | ICD-10-CM | POA: Insufficient documentation

## 2022-03-18 DIAGNOSIS — I4819 Other persistent atrial fibrillation: Secondary | ICD-10-CM | POA: Diagnosis not present

## 2022-03-18 MED ORDER — METOPROLOL SUCCINATE ER 25 MG PO TB24: ORAL_TABLET | ORAL | Status: DC

## 2022-03-18 NOTE — Patient Instructions (Addendum)
Increase Metoprolol 50- Taking two tablets by mouth once daily Cardioversion scheduled for August 3rd, 2023  - Arrive at the [facility] and go to admitting at 10:00am  - Do not eat or drink anything after midnight the night prior to your procedure.  - Take all your morning medication (except diabetic medications) with a sip of water prior to arrival.  - You will not be able to drive home after your procedure.  - Do NOT miss any doses of your blood thinner - if you should miss a dose please notify our office immediately.  - If you feel as if you go back into normal rhythm prior to scheduled cardioversion, please notify our office immediately. If your procedure is canceled in the cardioversion suite you will be charged a cancellation fee. Increase Metoprolol to 50mg  - Taking two tablets by mouth once daily

## 2022-03-18 NOTE — H&P (View-Only) (Signed)
Primary Care Physician: Madelin Headings, MD Primary Cardiologist: Dr Anne Fu Primary Electrophysiologist: Dr Lalla Brothers Referring Physician: Dr Rinaldo Cloud is a 76 y.o. female with a history of HTN, HLD, CAD, and atrial fibrillation who presents for follow up in the [facility]. The patient was initially diagnosed with atrial fibrillation 06/25/20 after presenting to urgent care with symptoms of dizziness and palpitations. Patient is on Eliquis for a CHADS2VASC score of 5. She underwent DCCV on 08/15/20. Unfortunately, she felt she was back in afib with palpitations about 3 weeks later.   Patient is s/p afib ablation with Dr Lalla Brothers on 12/10/20.   On follow up today, patient reports that on 03/16/22 she noted an elevated heart rate with tachypalpitations. She is in atrial flutter today. There were no specific triggers that she could identify. She does have some fatigue. She has increased her BB dose with minimal effect on her heart rate.   Today, she denies symptoms of chest pain, shortness of breath, orthopnea, PND, lower extremity edema, dizziness, presyncope, syncope, snoring, daytime somnolence, bleeding, or neurologic sequela. The patient is tolerating medications without difficulties and is otherwise without complaint today.    Atrial Fibrillation Risk Factors:  she does not have symptoms or diagnosis of sleep apnea. she does not have a history of rheumatic fever. she does not have a history of alcohol use. The patient does have a history of early familial atrial fibrillation or other arrhythmias. Son has afib.  she has a BMI of Body mass index is 22.31 kg/m.Marland Kitchen Filed Weights   03/18/22 0849  Weight: 59 kg     Family History  Problem Relation Age of Onset   Atrial fibrillation Mother        Has pacer doing very well age 11 passed suddnely age 31 poss CV     Atrial Fibrillation Management history:  Previous antiarrhythmic drugs: none Previous  cardioversions: 08/15/20 Previous ablations: 12/10/20 CHADS2VASC score: 5 Anticoagulation history: Eliquis   Past Medical History:  Diagnosis Date   Cataract 10/18/2011   Followed by opthalmology.    COLONIC POLYPS, HX OF 10/12/2009   COVID-19 03/2020   HYPERLIPIDEMIA 05/31/2007   NEPHROLITHIASIS, HX OF 05/31/2007   Past Surgical History:  Procedure Laterality Date   ATRIAL FIBRILLATION ABLATION N/A 12/10/2020   Procedure: ATRIAL FIBRILLATION ABLATION;  Surgeon: Lanier Prude, MD;  Location: [facility] INVASIVE CV LAB;  Service: Cardiovascular;  Laterality: N/A;   CARDIOVERSION N/A 08/15/2020   Procedure: CARDIOVERSION;  Surgeon: Meriam Sprague, MD;  Location: [facility] ENDOSCOPY;  Service: Cardiovascular;  Laterality: N/A;   CATARACT EXTRACTION, BILATERAL      Current Outpatient Medications  Medication Sig Dispense Refill   apixaban (ELIQUIS) 5 MG TABS tablet TAKE 1 TABLET TWICE DAILY 180 tablet 1   Artificial Tear Ointment (DRY EYES OP) Apply 1 drop to eye daily as needed (dry eyes).     Cholecalciferol (VITAMIN D) 2000 UNITS tablet Take 2,000 Units by mouth daily.     Fluorouracil 5 % SOLN SMARTSIG:sparingly Topical Twice Daily     gabapentin (NEURONTIN) 100 MG capsule Take 1 capsule (100 mg total) by mouth at bedtime. (Patient taking differently: Take 100 mg by mouth at bedtime as needed (pain).) 180 capsule 0   LUTEIN PO Take 1 tablet by mouth daily.     metoprolol succinate (TOPROL-XL) 25 MG 24 hr tablet Take 1 tablet (25 mg total) by mouth daily. 90 tablet 3   No current  facility-administered medications for this encounter.    Allergies  Allergen Reactions   Pravastatin     Headache on 40mg  daily dosing. Tolerates 10 and 20mg    Rosuvastatin     rosuvastatin 5mg  3x weekly and daily - headaches and myalgias   Sulfonamide Derivatives Itching and Rash    Social History   Socioeconomic History   Marital status: Single    Spouse name: Not on file   Number of children: Not on file    Years of education: Not on file   Highest education level: Not on file  Occupational History   Occupation: Retired  Tobacco Use   Smoking status: Former    Years: 2.00    Types: Cigarettes    Quit date: 09/08/1964    Years since quitting: 57.5   Smokeless tobacco: Never   Tobacco comments:    Former smoker 03/18/22  Vaping Use   Vaping Use: Never used  Substance and Sexual Activity   Alcohol use: Yes    Comment: occassional   Drug use: No   Sexual activity: Not on file  Other Topics Concern   Not on file  Social History Narrative   Retired    Non smoker   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    05/19/22 2 1 cat neg tob      Social Determinants of Health   Financial Resource Strain: Low Risk  (05/06/2021)   Overall Financial Resource Strain (CARDIA)    Difficulty of Paying Living Expenses: Not hard at all  Food Insecurity: No Food Insecurity (05/06/2021)   Hunger Vital Sign    Worried About Running Out of Food in the Last Year: Never true    Ran Out of Food in the Last Year: Never true  Transportation Needs: No Transportation Needs (05/06/2021)   PRAPARE - 05/08/2021 (Medical): No    Lack of Transportation (Non-Medical): No  Physical Activity: Insufficiently Active (05/06/2021)   Exercise Vital Sign    Days of Exercise per Week: 7 days    Minutes of Exercise per Session: 20 min  Stress: No Stress Concern Present (05/06/2021)   Administrator, [employer] of Occupational Health - Occupational Stress Questionnaire    Feeling of Stress : Not at all  Social Connections: Moderately Integrated (05/06/2021)   Social Connection and Isolation Panel [NHANES]    Frequency of Communication with Friends and Family: Three times a week    Frequency of Social Gatherings with Friends and Family: Three times a week    Attends Religious Services: Never    Active Member of Clubs or Organizations: Yes    Attends 05/08/2021 Meetings: More  than 4 times per year    Marital Status: Living with partner  Intimate Partner Violence: Not At Risk (05/06/2021)   Humiliation, Afraid, Rape, and Kick questionnaire    Fear of Current or Ex-Partner: No    Emotionally Abused: No    Physically Abused: No    Sexually Abused: No     ROS- All systems are reviewed and negative except as per the HPI above.  Physical Exam: Vitals:   03/18/22 0849  BP: 104/80  Pulse: (!) 123  Weight: 59 kg  Height: 5\' 4"  (1.626 m)     GEN- The patient is a well appearing elderly female, alert and oriented x 3 today.   HEENT-head normocephalic, atraumatic, sclera clear, conjunctiva pink, hearing intact, trachea midline. Lungs- Clear to ausculation bilaterally,  normal work of breathing Heart- Regular rate and rhythm, tachycardia, no murmurs, rubs or gallops  GI- soft, NT, ND, + BS Extremities- no clubbing, cyanosis, or edema MS- no significant deformity or atrophy Skin- no rash or lesion Psych- euthymic mood, full affect Neuro- strength and sensation are intact   Wt Readings from Last 3 Encounters:  03/18/22 59 kg  11/19/21 60.6 kg  08/26/21 60.2 kg    EKG today demonstrates  Atypical atrial flutter with 2:1 block Vent. rate 123 BPM PR interval * ms QRS duration 88 ms QT/QTcB 314/449 ms  Echo 08/10/20 demonstrated  1. Left ventricular ejection fraction, by estimation, is 60 to 65%. The  left ventricle has normal function. The left ventricle has no regional  wall motion abnormalities. Left ventricular diastolic parameters are  indeterminate.   2. Right ventricular systolic function is normal. The right ventricular  size is normal. There is normal pulmonary artery systolic pressure.   3. Left atrial size was moderately dilated.   4. Right atrial size was mildly dilated.   5. The mitral valve is degenerative. Mild mitral valve regurgitation. No  evidence of mitral stenosis. Moderate mitral annular calcification.   6. Tricuspid valve  regurgitation is mild to moderate.   7. The aortic valve is tricuspid. Aortic valve regurgitation is not  visualized. Mild to moderate aortic valve sclerosis/calcification is  present, without any evidence of aortic stenosis.   8. The inferior vena cava is normal in size with greater than 50%  respiratory variability, suggesting right atrial pressure of 3 mmHg.  Epic records are reviewed at length today  CHA2DS2-VASc Score = 5  The patient's score is based upon: CHF History: 0 HTN History: 1 Diabetes History: 0 Stroke History: 0 Vascular Disease History: 1 Age Score: 2 Gender Score: 1        ASSESSMENT AND PLAN: 1. Persistent Atrial Fibrillation/atrial flutter The patient's CHA2DS2-VASc score is 5, indicating a 7.2% annual risk of stroke. S/p afib ablation 12/10/20 Patient in rapid atrial flutter today. We discussed rhythm control options. Will plan for DCCV. If patient has worsening symptoms between now and DCCV, may need urgent DCCV in ED, instructed patient to use [place] [facility] if needed rather than free standing ED. Continue Eliquis 5 mg BID  Continue Toprol 50 mg daily for now.  2. Secondary Hypercoagulable State (ICD10:  D68.69) The patient is at significant risk for stroke/thromboembolism based upon her CHA2DS2-VASc Score of 5.  Continue Apixaban (Eliquis).   3. HTN Stable, no changes today.  4. CAD CAC score 286 Intolerant of statins, seen in lipid clinic. Followed by Dr Anne Fu No anginal symptoms.   Follow up with Dr Lalla Brothers as scheduled.    Jorja Loa PA-C [facility] [address] [place], [place] [address] [phone] 03/18/2022 9:13 AM

## 2022-03-18 NOTE — Progress Notes (Signed)
  Primary Care Physician: Panosh, Wanda K, MD Primary Cardiologist: Dr Skains Primary Electrophysiologist: Dr Lambert Referring Physician: Dr Skains   Nichole Berg is a 76 y.o. female with a history of HTN, HLD, CAD, and atrial fibrillation who presents for follow up in the [facility]. The patient was initially diagnosed with atrial fibrillation 06/25/20 after presenting to urgent care with symptoms of dizziness and palpitations. Patient is on Eliquis for a CHADS2VASC score of 5. She underwent DCCV on 08/15/20. Unfortunately, she felt she was back in afib with palpitations about 3 weeks later.   Patient is s/p afib ablation with Dr Lambert on 12/10/20.   On follow up today, patient reports that on 03/16/22 she noted an elevated heart rate with tachypalpitations. She is in atrial flutter today. There were no specific triggers that she could identify. She does have some fatigue. She has increased her BB dose with minimal effect on her heart rate.   Today, she denies symptoms of chest pain, shortness of breath, orthopnea, PND, lower extremity edema, dizziness, presyncope, syncope, snoring, daytime somnolence, bleeding, or neurologic sequela. The patient is tolerating medications without difficulties and is otherwise without complaint today.    Atrial Fibrillation Risk Factors:  she does not have symptoms or diagnosis of sleep apnea. she does not have a history of rheumatic fever. she does not have a history of alcohol use. The patient does have a history of early familial atrial fibrillation or other arrhythmias. Son has afib.  she has a BMI of Body mass index is 22.31 kg/m.. Filed Weights   03/18/22 0849  Weight: 59 kg     Family History  Problem Relation Age of Onset   Atrial fibrillation Mother        Has pacer doing very well age 95 passed suddnely age 96 poss CV     Atrial Fibrillation Management history:  Previous antiarrhythmic drugs: none Previous  cardioversions: 08/15/20 Previous ablations: 12/10/20 CHADS2VASC score: 5 Anticoagulation history: Eliquis   Past Medical History:  Diagnosis Date   Cataract 10/18/2011   Followed by opthalmology.    COLONIC POLYPS, HX OF 10/12/2009   COVID-19 03/2020   HYPERLIPIDEMIA 05/31/2007   NEPHROLITHIASIS, HX OF 05/31/2007   Past Surgical History:  Procedure Laterality Date   ATRIAL FIBRILLATION ABLATION N/A 12/10/2020   Procedure: ATRIAL FIBRILLATION ABLATION;  Surgeon: Lambert, Cameron T, MD;  [facility];  Service: Cardiovascular;  Laterality: N/A;   CARDIOVERSION N/A 08/15/2020   Procedure: CARDIOVERSION;  Surgeon: Pemberton, Heather E, MD;  Location: [facility] ENDOSCOPY;  Service: Cardiovascular;  Laterality: N/A;   CATARACT EXTRACTION, BILATERAL      Current Outpatient Medications  Medication Sig Dispense Refill   apixaban (ELIQUIS) 5 MG TABS tablet TAKE 1 TABLET TWICE DAILY 180 tablet 1   Artificial Tear Ointment (DRY EYES OP) Apply 1 drop to eye daily as needed (dry eyes).     Cholecalciferol (VITAMIN D) 2000 UNITS tablet Take 2,000 Units by mouth daily.     Fluorouracil 5 % SOLN SMARTSIG:sparingly Topical Twice Daily     gabapentin (NEURONTIN) 100 MG capsule Take 1 capsule (100 mg total) by mouth at bedtime. (Patient taking differently: Take 100 mg by mouth at bedtime as needed (pain).) 180 capsule 0   LUTEIN PO Take 1 tablet by mouth daily.     metoprolol succinate (TOPROL-XL) 25 MG 24 hr tablet Take 1 tablet (25 mg total) by mouth daily. 90 tablet 3   No current   facility-administered medications for this encounter.    Allergies  Allergen Reactions   Pravastatin     Headache on 40mg  daily dosing. Tolerates 10 and 20mg    Rosuvastatin     rosuvastatin 5mg  3x weekly and daily - headaches and myalgias   Sulfonamide Derivatives Itching and Rash    Social History   Socioeconomic History   Marital status: Single    Spouse name: Not on file   Number of children: Not on file    Years of education: Not on file   Highest education level: Not on file  Occupational History   Occupation: Retired  Tobacco Use   Smoking status: Former    Years: 2.00    Types: Cigarettes    Quit date: 09/08/1964    Years since quitting: 57.5   Smokeless tobacco: Never   Tobacco comments:    Former smoker 03/18/22  Vaping Use   Vaping Use: Never used  Substance and Sexual Activity   Alcohol use: Yes    Comment: occassional   Drug use: No   Sexual activity: Not on file  Other Topics Concern   Not on file  Social History Narrative   Retired    Non smoker   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    05/19/22 2 1 cat neg tob      Social Determinants of Health   Financial Resource Strain: Low Risk  (05/06/2021)   Overall Financial Resource Strain (CARDIA)    Difficulty of Paying Living Expenses: Not hard at all  Food Insecurity: No Food Insecurity (05/06/2021)   Hunger Vital Sign    Worried About Running Out of Food in the Last Year: Never true    Ran Out of Food in the Last Year: Never true  Transportation Needs: No Transportation Needs (05/06/2021)   PRAPARE - 05/08/2021 (Medical): No    Lack of Transportation (Non-Medical): No  Physical Activity: Insufficiently Active (05/06/2021)   Exercise Vital Sign    Days of Exercise per Week: 7 days    Minutes of Exercise per Session: 20 min  Stress: No Stress Concern Present (05/06/2021)   Administrator, Civil Service of Occupational Health - Occupational Stress Questionnaire    Feeling of Stress : Not at all  Social Connections: Moderately Integrated (05/06/2021)   Social Connection and Isolation Panel [NHANES]    Frequency of Communication with Friends and Family: Three times a week    Frequency of Social Gatherings with Friends and Family: Three times a week    Attends Religious Services: Never    Active Member of Clubs or Organizations: Yes    Attends 05/08/2021 Meetings: More  than 4 times per year    Marital Status: Living with partner  Intimate Partner Violence: Not At Risk (05/06/2021)   Humiliation, Afraid, Rape, and Kick questionnaire    Fear of Current or Ex-Partner: No    Emotionally Abused: No    Physically Abused: No    Sexually Abused: No     ROS- All systems are reviewed and negative except as per the HPI above.  Physical Exam: Vitals:   03/18/22 0849  BP: 104/80  Pulse: (!) 123  Weight: 59 kg  Height: 5\' 4"  (1.626 m)     GEN- The patient is a well appearing elderly female, alert and oriented x 3 today.   HEENT-head normocephalic, atraumatic, sclera clear, conjunctiva pink, hearing intact, trachea midline. Lungs- Clear to ausculation bilaterally,  normal work of breathing Heart- Regular rate and rhythm, tachycardia, no murmurs, rubs or gallops  GI- soft, NT, ND, + BS Extremities- no clubbing, cyanosis, or edema MS- no significant deformity or atrophy Skin- no rash or lesion Psych- euthymic mood, full affect Neuro- strength and sensation are intact   Wt Readings from Last 3 Encounters:  03/18/22 59 kg  11/19/21 60.6 kg  08/26/21 60.2 kg    EKG today demonstrates  Atypical atrial flutter with 2:1 block Vent. rate 123 BPM PR interval * ms QRS duration 88 ms QT/QTcB 314/449 ms  Echo 08/10/20 demonstrated  1. Left ventricular ejection fraction, by estimation, is 60 to 65%. The  left ventricle has normal function. The left ventricle has no regional  wall motion abnormalities. Left ventricular diastolic parameters are  indeterminate.   2. Right ventricular systolic function is normal. The right ventricular  size is normal. There is normal pulmonary artery systolic pressure.   3. Left atrial size was moderately dilated.   4. Right atrial size was mildly dilated.   5. The mitral valve is degenerative. Mild mitral valve regurgitation. No  evidence of mitral stenosis. Moderate mitral annular calcification.   6. Tricuspid valve  regurgitation is mild to moderate.   7. The aortic valve is tricuspid. Aortic valve regurgitation is not  visualized. Mild to moderate aortic valve sclerosis/calcification is  present, without any evidence of aortic stenosis.   8. The inferior vena cava is normal in size with greater than 50%  respiratory variability, suggesting right atrial pressure of 3 mmHg.  Epic records are reviewed at length today  CHA2DS2-VASc Score = 5  The patient's score is based upon: CHF History: 0 HTN History: 1 Diabetes History: 0 Stroke History: 0 Vascular Disease History: 1 Age Score: 2 Gender Score: 1        ASSESSMENT AND PLAN: 1. Persistent Atrial Fibrillation/atrial flutter The patient's CHA2DS2-VASc score is 5, indicating a 7.2% annual risk of stroke. S/p afib ablation 12/10/20 Patient in rapid atrial flutter today. We discussed rhythm control options. Will plan for DCCV. If patient has worsening symptoms between now and DCCV, may need urgent DCCV in ED, instructed patient to use [place] if needed rather than free standing ED. Continue Eliquis 5 mg BID  Continue Toprol 50 mg daily for now.  2. Secondary Hypercoagulable State (ICD10:  D68.69) The patient is at significant risk for stroke/thromboembolism based upon her CHA2DS2-VASc Score of 5.  Continue Apixaban (Eliquis).   3. HTN Stable, no changes today.  4. CAD CAC score 286 Intolerant of statins, seen in lipid clinic. Followed by Dr Anne Fu No anginal symptoms.   Follow up with Dr Lalla Brothers as scheduled.    Jorja Loa PA-C [facility] [address] [place], [place] [address] [phone] 03/18/2022 9:13 AM

## 2022-04-03 ENCOUNTER — Encounter (HOSPITAL_COMMUNITY): Payer: Self-pay | Admitting: Internal Medicine

## 2022-04-03 NOTE — Progress Notes (Signed)
Attempted to obtain medical history via telephone, unable to reach at this time. HIPAA compliant voicemail message left requesting return call to pre surgical testing department. 

## 2022-04-10 ENCOUNTER — Encounter (HOSPITAL_COMMUNITY): Payer: Self-pay | Admitting: Internal Medicine

## 2022-04-10 ENCOUNTER — Ambulatory Visit (HOSPITAL_BASED_OUTPATIENT_CLINIC_OR_DEPARTMENT_OTHER): Payer: Medicare PPO | Admitting: Certified Registered Nurse Anesthetist

## 2022-04-10 ENCOUNTER — Ambulatory Visit (HOSPITAL_COMMUNITY)
Admission: RE | Admit: 2022-04-10 | Discharge: 2022-04-10 | Disposition: A | Discharge status: Home / Self Care | Payer: Medicare PPO | Attending: Internal Medicine | Admitting: Internal Medicine

## 2022-04-10 ENCOUNTER — Ambulatory Visit (HOSPITAL_COMMUNITY)
Admission: RE | Admit: 2022-04-10 | Discharge: 2022-04-10 | Disposition: A | Discharge status: Home / Self Care | Payer: Medicare PPO | Source: Ambulatory Visit | Attending: Physician Assistant | Admitting: Physician Assistant

## 2022-04-10 ENCOUNTER — Encounter (HOSPITAL_COMMUNITY)
Admission: RE | Disposition: A | Discharge status: Home / Self Care | Payer: Self-pay | Source: Home / Self Care | Attending: Internal Medicine

## 2022-04-10 ENCOUNTER — Other Ambulatory Visit: Payer: Self-pay

## 2022-04-10 ENCOUNTER — Ambulatory Visit (HOSPITAL_COMMUNITY): Payer: Medicare PPO | Admitting: Certified Registered Nurse Anesthetist

## 2022-04-10 DIAGNOSIS — Z9889 Other specified postprocedural states: Secondary | ICD-10-CM | POA: Insufficient documentation

## 2022-04-10 DIAGNOSIS — Z7901 Long term (current) use of anticoagulants: Secondary | ICD-10-CM | POA: Insufficient documentation

## 2022-04-10 DIAGNOSIS — D6869 Other thrombophilia: Secondary | ICD-10-CM | POA: Insufficient documentation

## 2022-04-10 DIAGNOSIS — I4819 Other persistent atrial fibrillation: Secondary | ICD-10-CM | POA: Insufficient documentation

## 2022-04-10 DIAGNOSIS — I484 Atypical atrial flutter: Secondary | ICD-10-CM

## 2022-04-10 DIAGNOSIS — I10 Essential (primary) hypertension: Secondary | ICD-10-CM | POA: Insufficient documentation

## 2022-04-10 DIAGNOSIS — I2510 Atherosclerotic heart disease of native coronary artery without angina pectoris: Secondary | ICD-10-CM | POA: Insufficient documentation

## 2022-04-10 DIAGNOSIS — I4892 Unspecified atrial flutter: Secondary | ICD-10-CM | POA: Diagnosis not present

## 2022-04-10 DIAGNOSIS — I4891 Unspecified atrial fibrillation: Secondary | ICD-10-CM

## 2022-04-10 HISTORY — PX: CARDIOVERSION: SHX1299

## 2022-04-10 LAB — POCT I-STAT, CHEM 8
BUN: 12 mg/dL (ref 8–23)
Calcium, Ion: 1.23 mmol/L (ref 1.15–1.40)
Chloride: 104 mmol/L (ref 98–111)
Creatinine, Ser: 0.6 mg/dL (ref 0.44–1.00)
Glucose, Bld: 83 mg/dL (ref 70–99)
HCT: 36 % (ref 36.0–46.0)
Hemoglobin: 12.2 g/dL (ref 12.0–15.0)
Potassium: 4.3 mmol/L (ref 3.5–5.1)
Sodium: 140 mmol/L (ref 135–145)
TCO2: 27 mmol/L (ref 22–32)

## 2022-04-10 LAB — CBC
HCT: 36.7 % (ref 36.0–46.0)
Hemoglobin: 12 g/dL (ref 12.0–15.0)
MCH: 31.6 pg (ref 26.0–34.0)
MCHC: 32.7 g/dL (ref 30.0–36.0)
MCV: 96.6 fL (ref 80.0–100.0)
Platelets: 243 10*3/uL (ref 150–400)
RBC: 3.8 MIL/uL — ABNORMAL LOW (ref 3.87–5.11)
RDW: 12.5 % (ref 11.5–15.5)
WBC: 4.3 10*3/uL (ref 4.0–10.5)
nRBC: 0 % (ref 0.0–0.2)

## 2022-04-10 LAB — BASIC METABOLIC PANEL
Anion gap: 9 (ref 5–15)
BUN: 14 mg/dL (ref 8–23)
CO2: 25 mmol/L (ref 22–32)
Calcium: 9.3 mg/dL (ref 8.9–10.3)
Chloride: 105 mmol/L (ref 98–111)
Creatinine, Ser: 0.69 mg/dL (ref 0.44–1.00)
GFR, Estimated: >60 mL/min (ref >60)
Glucose, Bld: 94 mg/dL (ref 70–99)
Potassium: 4.7 mmol/L (ref 3.5–5.1)
Sodium: 139 mmol/L (ref 135–145)

## 2022-04-10 SURGERY — CARDIOVERSION
Anesthesia: General

## 2022-04-10 MED ORDER — PROPOFOL 10 MG/ML IV BOLUS
INTRAVENOUS | Status: DC | PRN
  Administered 2022-04-10: 40 mg via INTRAVENOUS

## 2022-04-10 MED ORDER — LIDOCAINE 2% (20 MG/ML) 5 ML SYRINGE
INTRAMUSCULAR | Status: DC | PRN
  Administered 2022-04-10: 60 mg via INTRAVENOUS

## 2022-04-10 MED ORDER — SODIUM CHLORIDE 0.9 % IV SOLN: INTRAVENOUS | Status: DC

## 2022-04-10 NOTE — Transfer of Care (Signed)
Immediate Anesthesia Transfer of Care Note  Patient: Nichole Berg  Procedure(s) Performed: CARDIOVERSION  Patient Location: PACU and Endoscopy Unit  Anesthesia Type:General  Level of Consciousness: drowsy, patient cooperative and responds to stimulation  Airway & Oxygen Therapy: Patient Spontanous Breathing  Post-op Assessment: Report given to RN and Post -op Vital signs reviewed and stable  Post vital signs: Reviewed and stable  Last Vitals:  Vitals Value Taken Time  BP 119/89 04/10/22 1110  Temp    Pulse 110 04/10/22 1114  Resp 23 04/10/22 1114  SpO2 99 % 04/10/22 1114  Vitals shown include unvalidated device data.  Last Pain:  Vitals:   04/10/22 1002  TempSrc: Temporal  PainSc: 0-No pain         Complications: No notable events documented.

## 2022-04-10 NOTE — Anesthesia Preprocedure Evaluation (Addendum)
Anesthesia Evaluation  Patient identified by MRN, date of birth, ID band Patient awake    Reviewed: Allergy & Precautions, NPO status , Patient's Chart, lab work & pertinent test results  History of Anesthesia Complications Negative for: history of anesthetic complications  Airway Mallampati: II  TM Distance: >3 FB Neck ROM: Full    Dental   Pulmonary neg pulmonary ROS, former smoker,    Pulmonary exam normal        Cardiovascular + dysrhythmias Atrial Fibrillation  Rhythm:Irregular Rate:Tachycardia     Neuro/Psych negative neurological ROS     GI/Hepatic negative GI ROS, Neg liver ROS,   Endo/Other  negative endocrine ROS  Renal/GU negative Renal ROS  negative genitourinary   Musculoskeletal negative musculoskeletal ROS (+)   Abdominal   Peds  Hematology negative hematology ROS (+)   Anesthesia Other Findings   Reproductive/Obstetrics                            Anesthesia Physical Anesthesia Plan  ASA: 3  Anesthesia Plan: General   Post-op Pain Management: Minimal or no pain anticipated   Induction: Intravenous  PONV Risk Score and Plan: 3 and TIVA and Treatment may vary due to age or medical condition  Airway Management Planned: Mask  Additional Equipment: None  Intra-op Plan:   Post-operative Plan:   Informed Consent: I have reviewed the patients History and Physical, chart, labs and discussed the procedure including the risks, benefits and alternatives for the proposed anesthesia with the patient or authorized representative who has indicated his/her understanding and acceptance.       Plan Discussed with:   Anesthesia Plan Comments:         Anesthesia Quick Evaluation

## 2022-04-10 NOTE — Interval H&P Note (Signed)
History and Physical Interval Note:  04/10/2022 10:35 AM  Nichole Berg  has presented today for surgery, with the diagnosis of A FLUTTER.  The various methods of treatment have been discussed with the patient and family. After consideration of risks, benefits and other options for treatment, the patient has consented to  Procedure(s): CARDIOVERSION (N/A) as a surgical intervention.  The patient's history has been reviewed, patient examined, no change in status, stable for surgery.  I have reviewed the patient's chart and labs.  Questions were answered to the patient's satisfaction.     [name] A Phoebie Shad

## 2022-04-10 NOTE — Anesthesia Postprocedure Evaluation (Signed)
Anesthesia Post Note  Patient: Nichole Berg  Procedure(s) Performed: CARDIOVERSION     Patient location during evaluation: Endoscopy Anesthesia Type: General Level of consciousness: awake and alert Pain management: pain level controlled Vital Signs Assessment: post-procedure vital signs reviewed and stable Respiratory status: spontaneous breathing, nonlabored ventilation and respiratory function stable Cardiovascular status: blood pressure returned to baseline and stable Postop Assessment: no apparent nausea or vomiting Anesthetic complications: no   No notable events documented.  Last Vitals:  Vitals:   04/10/22 1140 04/10/22 1150  BP: 109/68 105/78  Pulse: (!) 54 (!) 56  Resp: 16 19  Temp:    SpO2: 97% 98%    Last Pain:  Vitals:   04/10/22 1150  TempSrc:   PainSc: 0-No pain                 [name]

## 2022-04-10 NOTE — Anesthesia Procedure Notes (Signed)
Procedure Name: General with mask airway Date/Time: 04/10/2022 11:19 AM  Performed by: Drema Pry, CRNAPre-anesthesia Checklist: Patient identified, Emergency Drugs available, Suction available, Patient being monitored and Timeout performed Patient Re-evaluated:Patient Re-evaluated prior to induction Oxygen Delivery Method: Ambu bag Preoxygenation: Pre-oxygenation with 100% oxygen Induction Type: IV induction

## 2022-04-10 NOTE — CV Procedure (Signed)
   Electrical Cardioversion Procedure Note [employer] [id number] March 07, 1946  Procedure: Electrical Cardioversion Indications:  Atrial Flutter  Time Out: Verified patient identification, verified procedure,medications/allergies/relevent history reviewed, required imaging and test results available.  Performed  Procedure Details  The patient was NPO after midnight. Anesthesia was administered at the beside  by Dr.Witman.  Cardioversion was done with synchronized biphasic defibrillation with AP pads with 200 Joules.  The patient converted to normal sinus rhythm. The patient tolerated the procedure well   IMPRESSION:  Successful cardioversion of atrial flutter    Leticia Mcdiarmid A Sairah Knobloch 04/10/2022, 11:27 AM

## 2022-04-12 ENCOUNTER — Encounter (HOSPITAL_COMMUNITY): Payer: Self-pay | Admitting: Internal Medicine

## 2022-04-15 ENCOUNTER — Ambulatory Visit: Payer: Medicare PPO | Admitting: Family Medicine

## 2022-04-15 DIAGNOSIS — M5411 Radiculopathy, occipito-atlanto-axial region: Secondary | ICD-10-CM | POA: Diagnosis not present

## 2022-04-15 DIAGNOSIS — M9901 Segmental and somatic dysfunction of cervical region: Secondary | ICD-10-CM | POA: Diagnosis not present

## 2022-04-28 ENCOUNTER — Encounter: Payer: Self-pay | Admitting: Internal Medicine

## 2022-04-28 NOTE — Telephone Encounter (Signed)
Thanks for the update , we are fine . Can also ask cardiology for suggestions .

## 2022-05-01 ENCOUNTER — Telehealth: Payer: Self-pay | Admitting: Internal Medicine

## 2022-05-01 NOTE — Telephone Encounter (Signed)
Left message for patient to call back and schedule Medicare Annual Wellness Visit (AWV) either virtually or in office. Left  my jabber number [phone]   Last AWV ;05/06/21  please schedule at anytime with [facility] Nurse Health Advisor 1 or 2    

## 2022-05-02 ENCOUNTER — Ambulatory Visit: Payer: Medicare PPO | Admitting: Cardiology

## 2022-05-02 ENCOUNTER — Encounter: Payer: Self-pay | Admitting: Cardiology

## 2022-05-02 VITALS — BP 116/70 | HR 60 | Ht 64.0 in | Wt 133.4 lb

## 2022-05-02 DIAGNOSIS — I4819 Other persistent atrial fibrillation: Secondary | ICD-10-CM

## 2022-05-02 DIAGNOSIS — I2584 Coronary atherosclerosis due to calcified coronary lesion: Secondary | ICD-10-CM | POA: Diagnosis not present

## 2022-05-02 DIAGNOSIS — I2510 Atherosclerotic heart disease of native coronary artery without angina pectoris: Secondary | ICD-10-CM | POA: Diagnosis not present

## 2022-05-02 DIAGNOSIS — I484 Atypical atrial flutter: Secondary | ICD-10-CM

## 2022-05-02 MED ORDER — METOPROLOL SUCCINATE ER 50 MG PO TB24: 50.0000 mg | ORAL_TABLET | Freq: Every day | ORAL | 3 refills | Status: DC

## 2022-05-02 NOTE — Progress Notes (Signed)
Electrophysiology Office Follow up Visit Note:    Date:  05/02/2022   ID:  Nichole Berg, DOB 1946-06-23, MRN [id number]  PCP:  Madelin Headings, MD  [facility] Cardiologist:  Donato Schultz, MD  [facility] Electrophysiologist:  Lanier Prude, MD    Interval History:    Nichole Berg is a 76 y.o. female who presents for a follow up visit.  She underwent a successful atrial fibrillation ablation on December 10, 2020.  She has done well since the procedure.  She did recently have an episode of atrial flutter that was highly symptomatic with ventricular rates in the 120s to 130s.  She underwent a cardioversion on April 10, 2022 and has maintained normal rhythm since that time.  She has increased her Toprol-XL to 50 mg by mouth once daily.  She takes Eliquis for stroke prophylaxis.      Past Medical History:  Diagnosis Date   Cataract 10/18/2011   Followed by opthalmology.    COLONIC POLYPS, HX OF 10/12/2009   COVID-19 03/2020   HYPERLIPIDEMIA 05/31/2007   NEPHROLITHIASIS, HX OF 05/31/2007    Past Surgical History:  Procedure Laterality Date   ATRIAL FIBRILLATION ABLATION N/A 12/10/2020   Procedure: ATRIAL FIBRILLATION ABLATION;  Surgeon: Lanier Prude, MD;  [facility] LAB;  Service: Cardiovascular;  Laterality: N/A;   CARDIOVERSION N/A 08/15/2020   Procedure: CARDIOVERSION;  Surgeon: Meriam Sprague, MD;  Location: [facility] ENDOSCOPY;  Service: Cardiovascular;  Laterality: N/A;   CARDIOVERSION N/A 04/10/2022   Procedure: CARDIOVERSION;  Surgeon: Christell Constant, MD;  Location: [facility] ENDOSCOPY;  Service: Cardiovascular;  Laterality: N/A;   CATARACT EXTRACTION, BILATERAL      Current Medications: Current Meds  Medication Sig   acetaminophen (TYLENOL) 650 MG CR tablet Take 650 mg by mouth every 8 (eight) hours as needed for pain.   apixaban (ELIQUIS) 5 MG TABS tablet TAKE 1 TABLET TWICE DAILY   Carboxymethylcellul-Glycerin (LUBRICATING EYE DROPS OP) Place 1 drop into  both eyes daily as needed (dry eyes).   Cholecalciferol (VITAMIN D) 2000 UNITS tablet Take 2,000 Units by mouth daily.   metoprolol succinate (TOPROL-XL) 50 MG 24 hr tablet Take 1 tablet (50 mg total) by mouth daily. Take with or immediately following a meal.   Multiple Vitamins-Minerals (ADULT GUMMY PO) Take 1 capsule by mouth once a week.   Multiple Vitamins-Minerals (LUTEIN-ZEAXANTHIN PO) Take 1 tablet by mouth daily.   [DISCONTINUED] metoprolol succinate (TOPROL-XL) 25 MG 24 hr tablet Take two tablets by mouth once daily (Patient taking differently: Take 25 mg by mouth 2 (two) times daily.)     Allergies:   Pravastatin, Rosuvastatin, and Sulfonamide derivatives   Social History   Socioeconomic History   Marital status: Single    Spouse name: Not on file   Number of children: Not on file   Years of education: Not on file   Highest education level: Not on file  Occupational History   Occupation: Retired  Tobacco Use   Smoking status: Former    Years: 2.00    Types: Cigarettes    Quit date: 09/08/1964    Years since quitting: 57.6   Smokeless tobacco: Never   Tobacco comments:    Former smoker 03/18/22  Vaping Use   Vaping Use: Never used  Substance and Sexual Activity   Alcohol use: Yes    Comment: occassional   Drug use: No   Sexual activity: Not on file  Other Topics Concern   Not  on file  Social History Narrative   Retired Publishing rights manager    Non smoker   Works as Engineer, 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    [employer] 2 1 cat neg tob      Social Determinants of Health   Financial Resource Strain: Low Risk  (05/06/2021)   Overall Financial Resource Strain (CARDIA)    Difficulty of Paying Living Expenses: Not hard at all  Food Insecurity: No Food Insecurity (05/06/2021)   Hunger Vital Sign    Worried About Running Out of Food in the Last Year: Never true    Ran Out of Food in the Last Year: Never true  Transportation Needs: No Transportation Needs (05/06/2021)   PRAPARE -  Administrator, Civil Service (Medical): No    Lack of Transportation (Non-Medical): No  Physical Activity: Insufficiently Active (05/06/2021)   Exercise Vital Sign    Days of Exercise per Week: 7 days    Minutes of Exercise per Session: 20 min  Stress: No Stress Concern Present (05/06/2021)   [employer] of Occupational Health - Occupational Stress Questionnaire    Feeling of Stress : Not at all  Social Connections: Moderately Integrated (05/06/2021)   Social Connection and Isolation Panel [NHANES]    Frequency of Communication with Friends and Family: Three times a week    Frequency of Social Gatherings with Friends and Family: Three times a week    Attends Religious Services: Never    Active Member of Clubs or Organizations: Yes    Attends Engineer, structural: More than 4 times per year    Marital Status: Living with partner     Family History: The patient's family history includes Atrial fibrillation in her mother.  ROS:   Please see the history of present illness.    All other systems reviewed and are negative.  EKGs/Labs/Other Studies Reviewed:    The following studies were reviewed today:   EKG:  The ekg ordered today demonstrates sinus rhythm with a ventricular rate of 60 bpm  Recent Labs: 12/03/2021: ALT 18; TSH 1.54 04/10/2022: BUN 12; Creatinine, Ser 0.60; Hemoglobin 12.2; Platelets 243; Potassium 4.3; Sodium 140  Recent Lipid Panel    Component Value Date/Time   CHOL 237 (H) 12/03/2021 1144   TRIG 66.0 12/03/2021 1144   HDL 70.40 12/03/2021 1144   CHOLHDL 3 12/03/2021 1144   VLDL 13.2 12/03/2021 1144   LDLCALC 153 (H) 12/03/2021 1144   LDLDIRECT 160.3 10/13/2011 1058    Physical Exam:    VS:  BP 116/70   Pulse 60   Ht 5\' 4"  (1.626 m)   Wt 133 lb 6.4 oz (60.5 kg)   SpO2 98%   BMI 22.90 kg/m     Wt Readings from Last 3 Encounters:  05/02/22 133 lb 6.4 oz (60.5 kg)  04/10/22 131 lb (59.4 kg)  03/18/22 130 lb (59 kg)      GEN:  Well nourished, well developed in no acute distress HEENT: Normal NECK: No JVD; No carotid bruits LYMPHATICS: No lymphadenopathy CARDIAC: RRR, no murmurs, rubs, gallops RESPIRATORY:  Clear to auscultation without rales, wheezing or rhonchi  ABDOMEN: Soft, non-tender, non-distended MUSCULOSKELETAL:  No edema; No deformity  SKIN: Warm and dry NEUROLOGIC:  Alert and oriented x 3 PSYCHIATRIC:  Normal affect        ASSESSMENT:    1. Persistent atrial fibrillation (HCC)   2. Atypical atrial flutter (HCC)    PLAN:    In order of  problems listed above:  #Persistent atrial fibrillation and atrial flutter She has not had another episode of atrial fibrillation since her ablation but she did recently have an episode of persistent atrial flutter requiring cardioversion.  We discussed treatment options including continuing with a watchful waiting approach versus proceeding now to repeat ablation.  I did discuss repeat ablation or today's visit including the risks and recovery.  After our discussion, the patient would like to proceed with a watchful waiting approach.  We will plan to see her back in clinic in 6 months to reassess her burden of arrhythmia.  She will stay on the higher dose metoprolol for now.  She will continue Eliquis for stroke prophylaxis.  #Coronary artery disease The patient is discussing alternatives to statin therapy with her primary cardiologist, Dr. Anne Fu.     Total time spent with patient today 42 minutes. This includes reviewing records, evaluating the patient and coordinating care.   Medication Adjustments/Labs and Tests Ordered: Current medicines are reviewed at length with the patient today.  Concerns regarding medicines are outlined above.  Orders Placed This Encounter  Procedures   EKG 12-Lead   Meds ordered this encounter  Medications   metoprolol succinate (TOPROL-XL) 50 MG 24 hr tablet    Sig: Take 1 tablet (50 mg total) by mouth daily.  Take with or immediately following a meal.    Dispense:  90 tablet    Refill:  3     Signed, Steffanie Dunn, MD, [facility], [facility] 05/02/2022 5:23 PM    Electrophysiology [facility]

## 2022-05-02 NOTE — Patient Instructions (Signed)
Medication Instructions:  none *If you need a refill on your cardiac medications before your next appointment, please call your pharmacy*   Lab Work: none If you have labs (blood work) drawn today and your tests are completely normal, you will receive your results only by: MyChart Message (if you have MyChart) OR A paper copy in the mail If you have any lab test that is abnormal or we need to change your treatment, we will call you to review the results.   Testing/Procedures: none   Follow-Up: At [facility], you and your health needs are our priority.  As part of our continuing mission to provide you with exceptional heart care, we have created designated Provider Care Teams.  These Care Teams include your primary Cardiologist (physician) and Advanced Practice Providers (APPs -  Physician Assistants and Nurse Practitioners) who all work together to provide you with the care you need, when you need it.  We recommend signing up for the patient portal called "MyChart".  Sign up information is provided on this After Visit Summary.  MyChart is used to connect with patients for Virtual Visits (Telemedicine).  Patients are able to view lab/test results, encounter notes, upcoming appointments, etc.  Non-urgent messages can be sent to your provider as well.   To learn more about what you can do with MyChart, go to [email or web address].    Your next appointment:   6 month(s)  The format for your next appointment:   In Person  Provider:   You will see one of the following Advanced Practice Providers on your designated Care Team:   Francis Dowse, [place] Casimiro Needle "Mardelle Matte" Lanna Poche, PA-C}    Other Instructions none  Important Information About Sugar

## 2022-05-21 ENCOUNTER — Telehealth: Payer: Self-pay | Admitting: Internal Medicine

## 2022-05-21 NOTE — Telephone Encounter (Signed)
Left message for patient to call back and schedule Medicare Annual Wellness Visit (AWV) either virtually or in office. Left  my jabber number [phone]   Last AWV ;05/06/21  please schedule at anytime with [facility] Nurse Health Advisor 1 or 2    

## 2022-06-25 ENCOUNTER — Telehealth: Payer: Self-pay | Admitting: Internal Medicine

## 2022-06-25 NOTE — Telephone Encounter (Signed)
Left message for patient to call back and schedule Medicare Annual Wellness Visit (AWV) either virtually or in office. Left  my Nichole Berg number [phone]   Last AWV 05/06/21 please schedule with Nurse Health Adviser   45 min for awv-i and in office appointments 30 min for awv-s  phone/virtual appointments

## 2022-07-08 DIAGNOSIS — L578 Other skin changes due to chronic exposure to nonionizing radiation: Secondary | ICD-10-CM | POA: Diagnosis not present

## 2022-07-08 DIAGNOSIS — D1801 Hemangioma of skin and subcutaneous tissue: Secondary | ICD-10-CM | POA: Diagnosis not present

## 2022-07-08 DIAGNOSIS — L821 Other seborrheic keratosis: Secondary | ICD-10-CM | POA: Diagnosis not present

## 2022-07-08 DIAGNOSIS — L814 Other melanin hyperpigmentation: Secondary | ICD-10-CM | POA: Diagnosis not present

## 2022-07-08 DIAGNOSIS — L570 Actinic keratosis: Secondary | ICD-10-CM | POA: Diagnosis not present

## 2022-07-21 ENCOUNTER — Other Ambulatory Visit: Payer: Self-pay | Admitting: Cardiology

## 2022-07-21 DIAGNOSIS — I4819 Other persistent atrial fibrillation: Secondary | ICD-10-CM

## 2022-07-21 NOTE — Telephone Encounter (Signed)
Eliquis 5mg  refill request received. Patient is 76 years old, weight-60.5kg, Crea-0.60 on 8/3/202023, Diagnosis-Afib, and last seen by Dr. 06/11/2019 on 05/02/2022. Dose is appropriate based on dosing criteria. Will send in refill to requested pharmacy.

## 2022-07-29 ENCOUNTER — Telehealth: Payer: Self-pay | Admitting: Internal Medicine

## 2022-07-29 NOTE — Telephone Encounter (Signed)
Left message for patient to call back and schedule Medicare Annual Wellness Visit (AWV) either virtually or in office. Left  my Zachery Conch number [phone]   Last AWV 05/06/21 please schedule with Nurse Health Adviser   45 min for awv-i and in office appointments 30 min for awv-s  phone/virtual appointments

## 2022-08-20 DIAGNOSIS — M5411 Radiculopathy, occipito-atlanto-axial region: Secondary | ICD-10-CM | POA: Diagnosis not present

## 2022-08-20 DIAGNOSIS — M9901 Segmental and somatic dysfunction of cervical region: Secondary | ICD-10-CM | POA: Diagnosis not present

## 2022-09-02 ENCOUNTER — Encounter: Payer: Self-pay | Admitting: Family Medicine

## 2022-09-02 ENCOUNTER — Ambulatory Visit (INDEPENDENT_AMBULATORY_CARE_PROVIDER_SITE_OTHER): Payer: Medicare PPO | Admitting: Family Medicine

## 2022-09-02 VITALS — BP 110/62 | HR 79 | Temp 98.3°F | Ht 64.25 in | Wt 133.7 lb

## 2022-09-02 DIAGNOSIS — Z0000 Encounter for general adult medical examination without abnormal findings: Secondary | ICD-10-CM

## 2022-09-02 DIAGNOSIS — G720 Drug-induced myopathy: Secondary | ICD-10-CM | POA: Diagnosis not present

## 2022-09-02 DIAGNOSIS — Z0001 Encounter for general adult medical examination with abnormal findings: Secondary | ICD-10-CM

## 2022-09-02 DIAGNOSIS — T466X5A Adverse effect of antihyperlipidemic and antiarteriosclerotic drugs, initial encounter: Secondary | ICD-10-CM

## 2022-09-02 NOTE — Progress Notes (Signed)
Complete physical exam  Patient: Nichole Berg   DOB: [birth year]   75 y.o. Female  MRN: [id number]  Subjective:    Chief Complaint  Patient presents with   Annual Exam    Jimma Ortman is a 76 y.o. female who presents today for a complete physical exam. She reports consuming a general diet. Home exercise routine includes walking the dog several times a day and particiaptes in water aerobics. She generally feels well. She reports sleeping well. She does not have additional problems to discuss today.   I reviewed her hospitalization over the summer, she had an ablation for atrial flutter and had a cardioversion, pt reports no problems with her rhythm since then. She is maintained on eliquis 5 mg BID and toprol xl 50 mg daily. She denies any side effects to this medication.   Most recent fall risk assessment:    09/02/2022    8:18 AM  Fall Risk   Falls in the past year? 0  Number falls in past yr: 0  Injury with Fall? 0  Risk for fall due to : No Fall Risks  Follow up Falls evaluation completed     Most recent depression screenings:    09/02/2022    8:58 AM 08/26/2021    1:39 PM  PHQ 2/9 Scores  PHQ - 2 Score 0 0  PHQ- 9 Score 1 0    Vision:Within last year and Dental: No current dental problems and Receives regular dental care  Patient Active Problem List   Diagnosis Date Noted   Atypical atrial flutter (HCC)    Coronary artery calcification 11/19/2021   Persistent atrial fibrillation (HCC) 09/27/2020   Secondary hypercoagulable state (HCC) 09/27/2020   Chronic right shoulder pain 06/12/2020   Leg cramping 07/20/2018   Frozen shoulder 06/01/2018   Low back pain 02/11/2018   Other bursal cyst of wrist, left 01/21/2017   Slipped rib syndrome 08/06/2016   Degenerative arthritis of cervical spine 12/04/2015   Nonallopathic lesion of cervical region 12/04/2015   Nonallopathic lesion of thoracic region 12/04/2015   Nonallopathic lesion-rib cage 12/04/2015   Hyperglycemia  06/16/2014   Medicare annual wellness visit, subsequent 06/16/2014   Visit for preventive health examination 10/13/2011   COLONIC POLYPS, HX OF 10/12/2009   SYMPTOM, INSOMNIA NOS 06/30/2007   Hyperlipidemia 05/31/2007   Osteopenia 05/31/2007   NEPHROLITHIASIS, HX OF 05/31/2007      Patient Care Team: Madelin Headings, MD as PCP - General Jake Bathe, MD as PCP - Cardiology (Cardiology) Lanier Prude, MD as PCP - Electrophysiology (Cardiology) Sharrell Ku, MD as Consulting Physician (Gastroenterology) Mckinley Jewel, MD as Consulting Physician (Ophthalmology)   Outpatient Medications Prior to Visit  Medication Sig   acetaminophen (TYLENOL) 650 MG CR tablet Take 650 mg by mouth every 8 (eight) hours as needed for pain.   apixaban (ELIQUIS) 5 MG TABS tablet TAKE 1 TABLET TWICE DAILY   Carboxymethylcellul-Glycerin (LUBRICATING EYE DROPS OP) Place 1 drop into both eyes daily as needed (dry eyes).   Cholecalciferol (VITAMIN D) 2000 UNITS tablet Take 2,000 Units by mouth daily.   Fluorouracil 5 % SOLN Apply 1 Application topically 2 (two) times daily as needed (precancerous spots).   metoprolol succinate (TOPROL-XL) 50 MG 24 hr tablet Take 1 tablet (50 mg total) by mouth daily. Take with or immediately following a meal.   Multiple Vitamins-Minerals (ADULT GUMMY PO) Take 1 capsule by mouth once a week.   Multiple Vitamins-Minerals (LUTEIN-ZEAXANTHIN PO) Take 1 tablet  by mouth daily.   [DISCONTINUED] gabapentin (NEURONTIN) 100 MG capsule Take 1 capsule (100 mg total) by mouth at bedtime. (Patient not taking: Reported on 05/02/2022)   No facility-administered medications prior to visit.    Review of Systems  HENT:  Negative for hearing loss.   Eyes:  Negative for blurred vision.  Respiratory:  Negative for shortness of breath.   Cardiovascular:  Negative for chest pain.  Gastrointestinal: Negative.   Genitourinary: Negative.   Musculoskeletal:  Negative for back pain.   Neurological:  Negative for headaches.  Psychiatric/Behavioral:  Negative for depression.   All other systems reviewed and are negative.         Objective:     BP 110/62 (BP Location: Left Arm, Patient Position: Sitting, Cuff Size: Normal)   Pulse 79   Temp 98.3 F (36.8 C) (Oral)   Ht 5' 4.25" (1.632 m)   Wt 133 lb 11.2 oz (60.6 kg)   SpO2 97%   BMI 22.77 kg/m    Physical Exam Vitals reviewed.  Constitutional:      Appearance: Normal appearance. She is well-groomed and normal weight.  HENT:     Head: Normocephalic and atraumatic.     Mouth/Throat:     Mouth: Mucous membranes are moist.     Pharynx: Oropharynx is clear. No oropharyngeal exudate or posterior oropharyngeal erythema.  Eyes:     Conjunctiva/sclera: Conjunctivae normal.  Cardiovascular:     Rate and Rhythm: Normal rate and regular rhythm.     Pulses: Normal pulses.     Heart sounds: S1 normal and S2 normal.  Pulmonary:     Effort: Pulmonary effort is normal.     Breath sounds: Normal breath sounds and air entry.  Abdominal:     General: Abdomen is flat. Bowel sounds are normal.     Palpations: Abdomen is soft.  Musculoskeletal:        General: Normal range of motion.     Cervical back: Normal range of motion and neck supple.     Right lower leg: No edema.     Left lower leg: No edema.  Skin:    General: Skin is warm and dry.  Neurological:     Mental Status: She is alert and oriented to person, place, and time. Mental status is at baseline.     Gait: Gait is intact.  Psychiatric:        Mood and Affect: Mood and affect normal.        Speech: Speech normal.        Behavior: Behavior normal.        Judgment: Judgment normal.      No results found for any visits on 09/02/22.     Assessment & Plan:    Routine Health Maintenance and Physical Exam  Immunization History  Administered Date(s) Administered   Fluad Quad(high Dose 65+) 07/03/2022   Influenza Whole 08/14/2010   Influenza,  High Dose Seasonal PF 07/23/2016, 07/12/2019, 06/10/2020   Influenza,inj,Quad PF,6+ Mos 06/16/2014   Influenza-Unspecified 06/23/2017, 06/04/2021   PFIZER(Purple Top)SARS-COV-2 Vaccination 09/21/2019, 10/12/2019   Pfizer Covid-19 Vaccine Bivalent Booster 58yrs & up 06/25/2021   Pneumococcal Conjugate-13 06/16/2014   Pneumococcal Polysaccharide-23 10/12/2009   Td 09/09/2003, 10/12/2009   Tdap 06/04/2021   Unspecified SARS-COV-2 Vaccination 08/07/2021   Zoster Recombinat (Shingrix) 06/23/2017, 10/20/2017    Health Maintenance  Topic Date Due   Medicare Annual Wellness (AWV)  05/06/2022   COVID-19 Vaccine (4 - 2023-24 season) 09/18/2022 (Originally 05/09/2022)  DTaP/Tdap/Td (4 - Td or Tdap) 06/05/2031   Pneumonia Vaccine 75+ Years old  Completed   INFLUENZA VACCINE  Completed   DEXA SCAN  Completed   Hepatitis C Screening  Completed   Zoster Vaccines- Shingrix  Completed   HPV VACCINES  Aged Out   COLONOSCOPY (Pts 45-28yrs Insurance coverage will need to be confirmed)  Discontinued    Discussed health benefits of physical activity, and encouraged her to engage in regular exercise appropriate for her age and condition.  Problem List Items Addressed This Visit   None Visit Diagnoses     Healthy adult on routine physical examination    -  Primary   Statin myopathy         Normal physical exam findings. Follow up in 1 year for Annual Physical Exam. I reviewed her HM and her most recent bloodwork. She cannot take statins due to moderate-severe statin induced myopathy. Return in about 1 year (around 09/03/2023) for Annual Physical exam with Dr. Fabian Sharp.     Karie Georges, MD

## 2022-09-10 ENCOUNTER — Encounter: Payer: Medicare PPO | Admitting: Internal Medicine

## 2022-09-24 DIAGNOSIS — Z1231 Encounter for screening mammogram for malignant neoplasm of breast: Secondary | ICD-10-CM | POA: Diagnosis not present

## 2022-09-24 LAB — HM MAMMOGRAPHY

## 2022-09-25 DIAGNOSIS — M9901 Segmental and somatic dysfunction of cervical region: Secondary | ICD-10-CM | POA: Diagnosis not present

## 2022-09-25 DIAGNOSIS — M5411 Radiculopathy, occipito-atlanto-axial region: Secondary | ICD-10-CM | POA: Diagnosis not present

## 2022-10-06 ENCOUNTER — Telehealth: Payer: Self-pay | Admitting: Internal Medicine

## 2022-10-06 NOTE — Telephone Encounter (Signed)
Left message for patient to call back and schedule Medicare Annual Wellness Visit (AWV) either virtually or in office. Left  my Herbie Drape number [phone]   Last AWV  05/06/21  please schedule with Nurse Health Adviser   45 min for awv-i  in office appointments 30 min for awv-s & awv-i phone/virtual appointments

## 2022-10-12 IMAGING — CR DG CHEST 2V
2 series · 2 of 2 positions shown · non-contrast
Comparison: None.

CLINICAL DATA: Palpitations

EXAM:
CHEST - 2 VIEW

[chest pa]
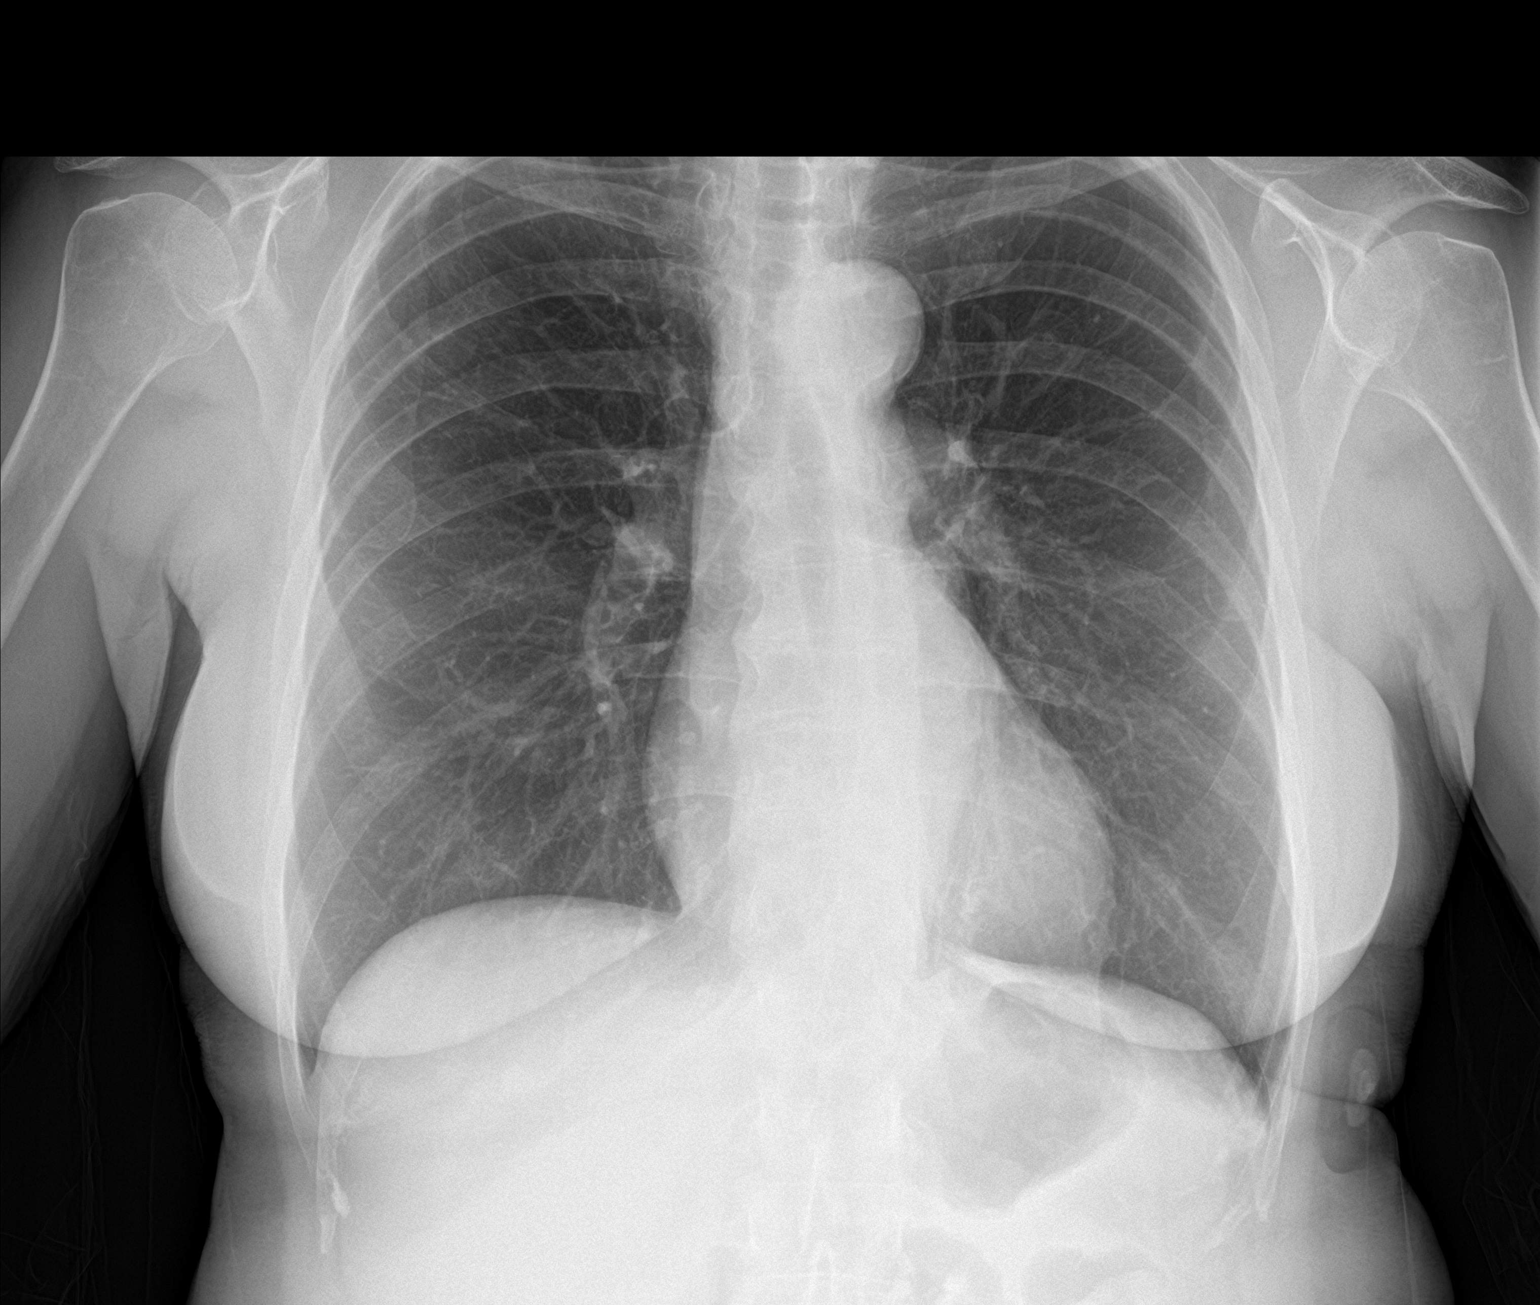

[chest lat]
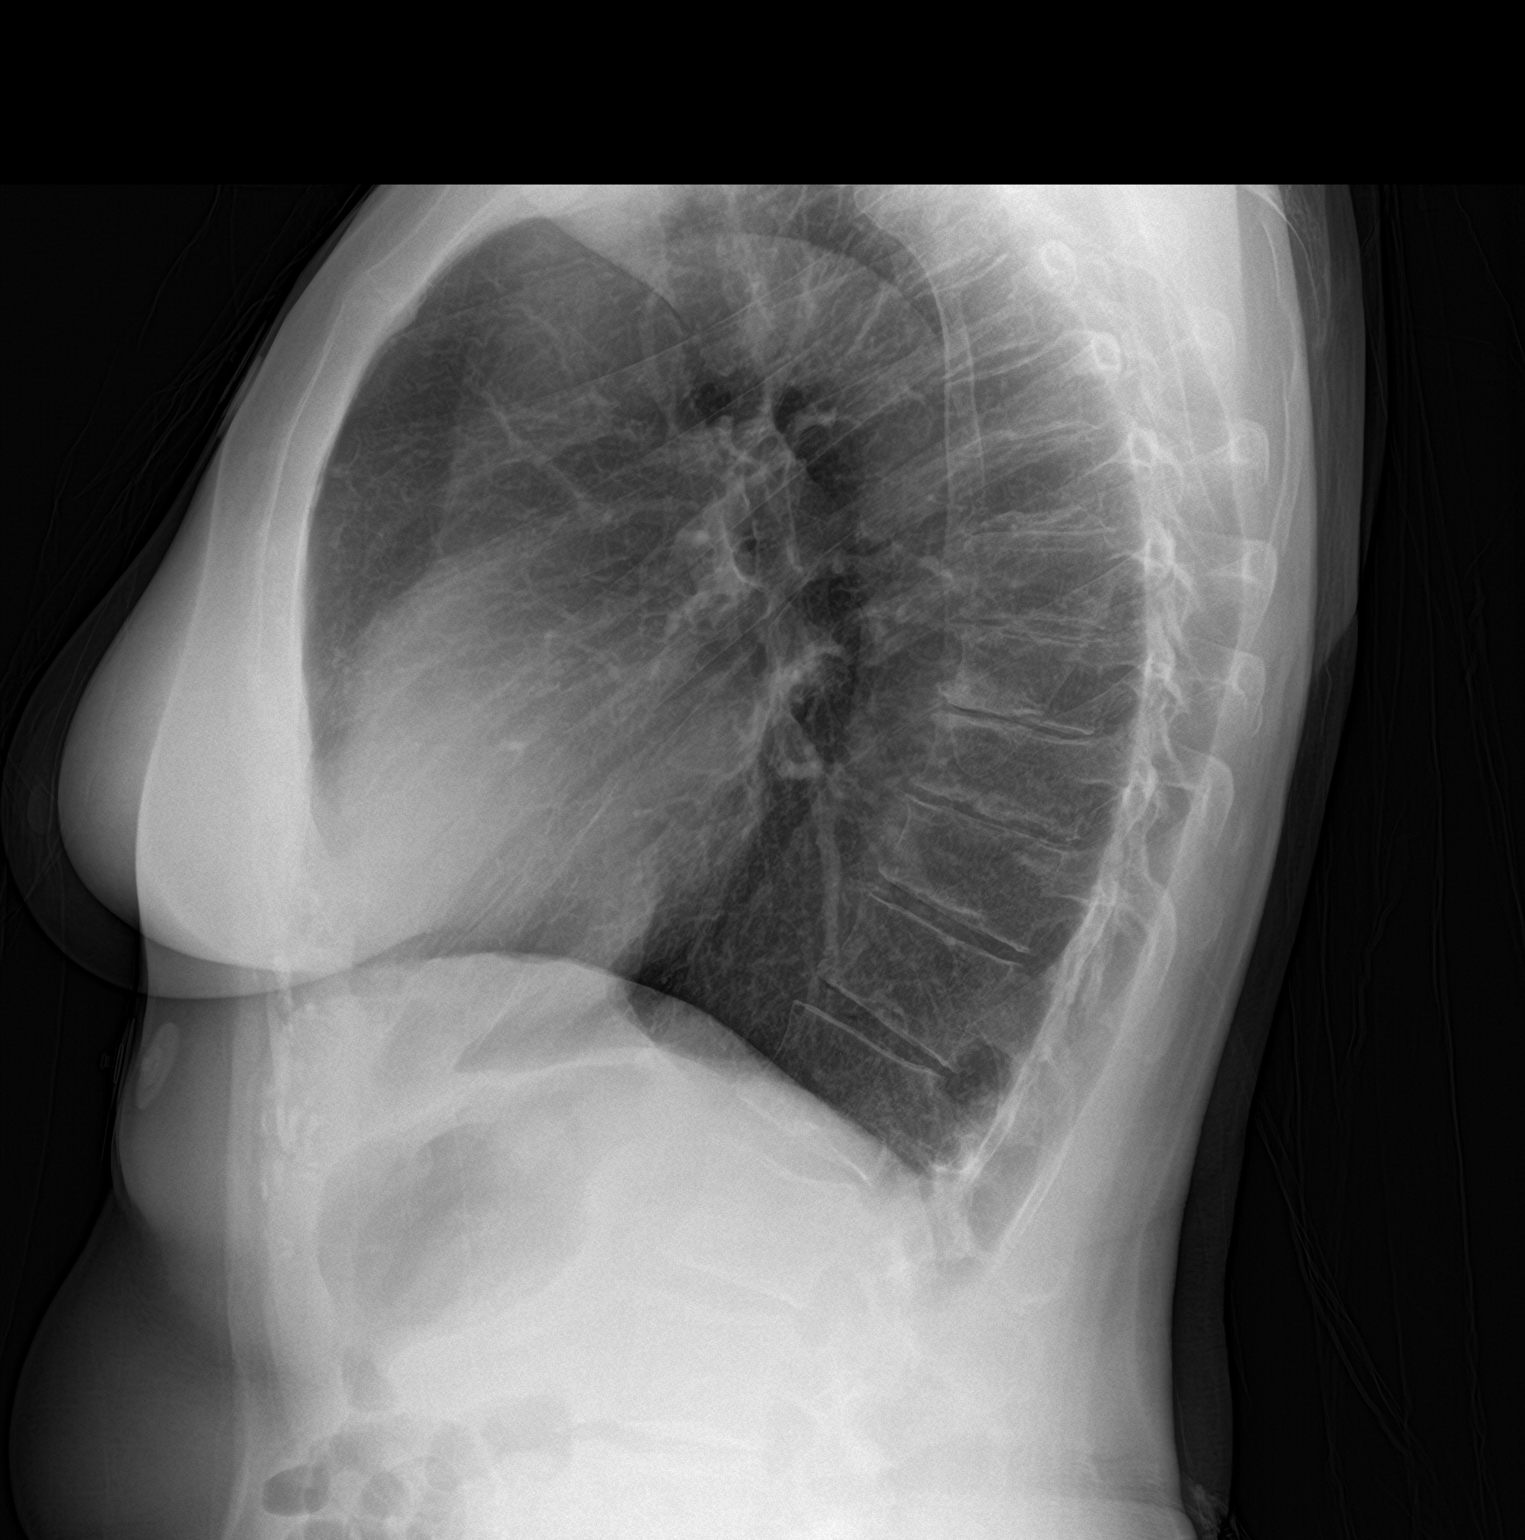

[2 of 2 positions shown; findings below may reference images not displayed]

FINDINGS: The heart size and mediastinal contours are within normal limits.
Both lungs are clear. The visualized skeletal structures are
unremarkable.
IMPRESSION: No active cardiopulmonary disease.

## 2022-10-27 ENCOUNTER — Telehealth: Payer: Self-pay | Admitting: Internal Medicine

## 2022-10-27 ENCOUNTER — Ambulatory Visit (HOSPITAL_COMMUNITY)
Admission: RE | Admit: 2022-10-27 | Discharge: 2022-10-27 | Disposition: A | Discharge status: Home / Self Care | Payer: Medicare PPO | Source: Ambulatory Visit | Attending: Physician Assistant | Admitting: Physician Assistant

## 2022-10-27 VITALS — BP 120/86 | HR 109 | Ht 64.25 in | Wt 134.8 lb

## 2022-10-27 DIAGNOSIS — Z7901 Long term (current) use of anticoagulants: Secondary | ICD-10-CM | POA: Diagnosis not present

## 2022-10-27 DIAGNOSIS — I4892 Unspecified atrial flutter: Secondary | ICD-10-CM | POA: Diagnosis not present

## 2022-10-27 DIAGNOSIS — I440 Atrioventricular block, first degree: Secondary | ICD-10-CM | POA: Diagnosis not present

## 2022-10-27 DIAGNOSIS — I2510 Atherosclerotic heart disease of native coronary artery without angina pectoris: Secondary | ICD-10-CM | POA: Diagnosis not present

## 2022-10-27 DIAGNOSIS — I484 Atypical atrial flutter: Secondary | ICD-10-CM

## 2022-10-27 DIAGNOSIS — Z79899 Other long term (current) drug therapy: Secondary | ICD-10-CM | POA: Diagnosis not present

## 2022-10-27 DIAGNOSIS — D6869 Other thrombophilia: Secondary | ICD-10-CM | POA: Insufficient documentation

## 2022-10-27 DIAGNOSIS — Z87891 Personal history of nicotine dependence: Secondary | ICD-10-CM | POA: Insufficient documentation

## 2022-10-27 DIAGNOSIS — E785 Hyperlipidemia, unspecified: Secondary | ICD-10-CM | POA: Insufficient documentation

## 2022-10-27 DIAGNOSIS — I4819 Other persistent atrial fibrillation: Secondary | ICD-10-CM | POA: Insufficient documentation

## 2022-10-27 DIAGNOSIS — I10 Essential (primary) hypertension: Secondary | ICD-10-CM | POA: Insufficient documentation

## 2022-10-27 NOTE — Patient Instructions (Signed)
Cardioversion scheduled for: Tuesday, March 12th   - Arrive at the [facility] and go to admitting at [facility] not eat or drink anything after midnight the night prior to your procedure.   - Take all your morning medication (except diabetic medications) with a sip of water prior to arrival.  - You will not be able to drive home after your procedure.    - Do NOT miss any doses of your blood thinner - if you should miss a dose please notify our office immediately.   - If you feel as if you go back into normal rhythm prior to scheduled cardioversion, please notify our office immediately.   If your procedure is canceled in the cardioversion suite you will be charged a cancellation fee.    Hold medication 7 days prior to scheduled procedure/anesthesia.  Restart medication on the normal dosing day after scheduled procedure/anesthesia  Dulaglutide (Trulicity) Exenatide extended release (Bydureon bcise) Semaglutide (Ozempic) (WEGOVY)  Tirzepatide (Mounjaro)     Hold medication 24 hours prior to scheduled procedure/anesthesia.   Restart medication on the following day after scheduled procedure/anesthesia   Exenatide (Byetta)  Liraglutide (Victoza, Saxenda)  Lixisenatide (Adlyxin)  Semaglutide (Rybelsus) Polyethylene Glycol Loxenatide   For those patients who have a scheduled procedure/anesthesia on the same day of the week as their dose, hold the medication on the day of surgery.  They can take their scheduled dose the week before.  **Patients on the above medications scheduled for elective procedures that have not held the medication for the appropriate amount of time are at risk of cancellation or change in the anesthetic plan.

## 2022-10-27 NOTE — Telephone Encounter (Signed)
Contacted Nichole Berg to schedule their annual wellness visit. Appointment made for 10/29/22.   Nichole Berg AWV direct phone # [phone]

## 2022-10-27 NOTE — Telephone Encounter (Signed)
Contacted Nichole Berg to schedule their annual wellness visit. Appointment made for 10/29/22.  Nichole Berg AWV direct phone # [phone]

## 2022-10-27 NOTE — Progress Notes (Signed)
Primary Care Physician: Burnis Medin, MD Primary Cardiologist: Dr Marlou Porch Primary Electrophysiologist: Dr Quentin Ore Referring Physician: Dr Renford Dills is a 77 y.o. female with a history of HTN, HLD, CAD, and atrial fibrillation who presents for follow up in the [facility]. The patient was initially diagnosed with atrial fibrillation 06/25/20 after presenting to urgent care with symptoms of dizziness and palpitations. Patient is on Eliquis for a CHADS2VASC score of 5. She underwent DCCV on 08/15/20. Unfortunately, she felt she was back in afib with palpitations about 3 weeks later.   Patient is s/p afib ablation with Dr Quentin Ore on 12/10/20.   On 03/16/22 she noted an elevated heart rate with tachypalpitations. She was in atrial flutter and underwent DCCV on 04/10/22. She was seen by Dr Quentin Ore on 05/02/22 and watchful waiting was pursued at that time.   On follow up today, patient reports that about one week ago she had tachypalpitations and noted her heart rates were elevated on her smart watch. She is back in atrial flutter today. There were no specific triggers that she could identify.   Today, she denies symptoms of chest pain, shortness of breath, orthopnea, PND, lower extremity edema, dizziness, presyncope, syncope, snoring, daytime somnolence, bleeding, or neurologic sequela. The patient is tolerating medications without difficulties and is otherwise without complaint today.    Atrial Fibrillation Risk Factors:  she does not have symptoms or diagnosis of sleep apnea. she does not have a history of rheumatic fever. she does not have a history of alcohol use. The patient does have a history of early familial atrial fibrillation or other arrhythmias. Son has afib.  she has a BMI of Body mass index is 22.96 kg/m.Marland Kitchen Filed Weights   10/27/22 1447  Weight: 61.1 kg    Family History  Problem Relation Age of Onset   Atrial fibrillation Mother         Has pacer doing very well age 60 passed suddnely age 49 poss CV     Atrial Fibrillation Management history:  Previous antiarrhythmic drugs: none Previous cardioversions: 08/15/20, 04/10/22 Previous ablations: 12/10/20 CHADS2VASC score: 5 Anticoagulation history: Eliquis   Past Medical History:  Diagnosis Date   Cataract 10/18/2011   Followed by opthalmology.    COLONIC POLYPS, HX OF 10/12/2009   COVID-19 03/2020   HYPERLIPIDEMIA 05/31/2007   NEPHROLITHIASIS, HX OF 05/31/2007   Past Surgical History:  Procedure Laterality Date   ATRIAL FIBRILLATION ABLATION N/A 12/10/2020   Procedure: ATRIAL FIBRILLATION ABLATION;  Surgeon: Vickie Epley, MD;  Location: [facility];  Service: Cardiovascular;  Laterality: N/A;   CARDIOVERSION N/A 08/15/2020   Procedure: CARDIOVERSION;  Surgeon: Freada Bergeron, MD;  [facility] ENDOSCOPY;  Service: Cardiovascular;  Laterality: N/A;   CARDIOVERSION N/A 04/10/2022   Procedure: CARDIOVERSION;  Surgeon: Werner Lean, MD;  Location: [facility] ENDOSCOPY;  Service: Cardiovascular;  Laterality: N/A;   CATARACT EXTRACTION, BILATERAL      Current Outpatient Medications  Medication Sig Dispense Refill   acetaminophen (TYLENOL) 650 MG CR tablet Take 650 mg by mouth as needed for pain.     apixaban (ELIQUIS) 5 MG TABS tablet TAKE 1 TABLET TWICE DAILY 180 tablet 1   Carboxymethylcellul-Glycerin (LUBRICATING EYE DROPS OP) Place 1 drop into both eyes daily as needed (dry eyes).     Cholecalciferol (VITAMIN D) 2000 UNITS tablet Take 2,000 Units by mouth daily.     Fluorouracil 5 % SOLN Apply 1 Application topically  2 (two) times daily as needed (precancerous spots).     metoprolol succinate (TOPROL-XL) 50 MG 24 hr tablet Take 1 tablet (50 mg total) by mouth daily. Take with or immediately following a meal. 90 tablet 3   Multiple Vitamins-Minerals (ADULT GUMMY PO) Take 1 capsule by mouth once a week.     Multiple Vitamins-Minerals (LUTEIN-ZEAXANTHIN PO)  Take 1 tablet by mouth daily.     No current facility-administered medications for this encounter.    Allergies  Allergen Reactions   Pravastatin     Headache on 46m daily dosing. Tolerates 10 and 274m  Rosuvastatin     rosuvastatin 49m67mx weekly and daily - headaches and myalgias   Sulfonamide Derivatives Itching and Rash    Social History   Socioeconomic History   Marital status: Single    Spouse name: Not on file   Number of children: Not on file   Years of education: Not on file   Highest education level: Not on file  Occupational History   Occupation: Retired  Tobacco Use   Smoking status: Former    Years: 2.00    Types: Cigarettes    Quit date: 09/08/1964    Years since quitting: 58.1   Smokeless tobacco: Never   Tobacco comments:    Former smoker 03/18/22  Vaping Use   Vaping Use: Never used  Substance and Sexual Activity   Alcohol use: Yes    Comment: occassional   Drug use: No   Sexual activity: Not on file  Other Topics Concern   Not on file  Social History Narrative   Retired nurDesigner, jewellery Non smoker   Works as nurMarine scientist weekend clinic part time    H [employer]1 cat neg tob      Social Determinants of Health   Financial Resource Strain: LowMercer8/29/2022)   Overall Financial Resource Strain (CARDIA)    Difficulty of Paying Living Expenses: Not hard at all  Food Insecurity: No Foo[place]/29/2022)   Hunger Vital Sign    Worried About Running Out of Food in the Last Year: Never true    [place] the Last Year: Never true  Transportation Needs: No Transportation Needs (05/06/2021)   PRAPARE - TraHydrologistedical): No    Lack of Transportation (Non-Medical): No  Physical Activity: Insufficiently Active (05/06/2021)   Exercise Vital Sign    Days of Exercise per Week: 7 days    Minutes of Exercise per Session: 20 min  Stress: No Stress Concern Present (05/06/2021)   [place] Feeling of Stress : Not at all  Social Connections: Moderately Integrated (05/06/2021)   Social Connection and Isolation Panel [NHANES]    Frequency of Communication with Friends and Family: Three times a week    Frequency of Social Gatherings with Friends and Family: Three times a week    Attends Religious Services: Never    Active Member of Clubs or Organizations: Yes    Attends CluArchivistetings: More than 4 times per year    Marital Status: Living with partner  Intimate Partner Violence: Not At Risk (05/06/2021)   Humiliation, Afraid, Rape, and Kick questionnaire    Fear of Current or Ex-Partner: No    Emotionally Abused: No    Physically Abused: No    Sexually Abused: No     ROS-  All systems are reviewed and negative except as per the HPI above.  Physical Exam: Vitals:   10/27/22 1447  BP: 120/86  Pulse: (!) 109  Weight: 61.1 kg  Height: 5' 4.25" (1.632 m)    GEN- The patient is a well appearing elderly female, alert and oriented x 3 today.   HEENT-head normocephalic, atraumatic, sclera clear, conjunctiva pink, hearing intact, trachea midline. Lungs- Clear to ausculation bilaterally, normal work of breathing Heart- Regular rate and rhythm, no murmurs, rubs or gallops  GI- soft, NT, ND, + BS Extremities- no clubbing, cyanosis, or edema MS- no significant deformity or atrophy Skin- no rash or lesion Psych- euthymic mood, full affect Neuro- strength and sensation are intact   Wt Readings from Last 3 Encounters:  10/27/22 61.1 kg  09/02/22 60.6 kg  05/02/22 60.5 kg    EKG today demonstrates  Atrial flutter with 2:1 block Vent. rate 109 BPM PR interval 228 ms QRS duration 84 ms QT/QTcB 316/425 ms  Echo 08/10/20 demonstrated  1. Left ventricular ejection fraction, by estimation, is 60 to 65%. The  left ventricle has normal function. The left ventricle has no regional  wall motion  abnormalities. Left ventricular diastolic parameters are  indeterminate.   2. Right ventricular systolic function is normal. The right ventricular  size is normal. There is normal pulmonary artery systolic pressure.   3. Left atrial size was moderately dilated.   4. Right atrial size was mildly dilated.   5. The mitral valve is degenerative. Mild mitral valve regurgitation. No  evidence of mitral stenosis. Moderate mitral annular calcification.   6. Tricuspid valve regurgitation is mild to moderate.   7. The aortic valve is tricuspid. Aortic valve regurgitation is not  visualized. Mild to moderate aortic valve sclerosis/calcification is  present, without any evidence of aortic stenosis.   8. The inferior vena cava is normal in size with greater than 50%  respiratory variability, suggesting right atrial pressure of 3 mmHg.  Epic records are reviewed at length today  CHA2DS2-VASc Score = 5  The patient's score is based upon: CHF History: 0 HTN History: 1 Diabetes History: 0 Stroke History: 0 Vascular Disease History: 1 Age Score: 2 Gender Score: 1      ASSESSMENT AND PLAN: 1. Persistent Atrial Fibrillation/atrial flutter The patient's CHA2DS2-VASc score is 5, indicating a 7.2% annual risk of stroke. S/p afib ablation 12/10/20 She is back in atrial flutter. We discussed rhythm control options today including AAD and repeat ablation. She would like to pursue repeat ablation at this time. She already has a visit with Dr Quentin Ore 2/27 to discuss. In the meantime, will arrange for a DCCV.  Continue Eliquis 5 mg BID  Continue Toprol 50 mg daily   2. Secondary Hypercoagulable State (ICD10:  D68.69) The patient is at significant risk for stroke/thromboembolism based upon her CHA2DS2-VASc Score of 5.  Continue Apixaban (Eliquis).   3. HTN Stable, no changes today.  4. CAD CAC score 286 Intolerant of statins, seen in lipid clinic. Followed by Dr Marlou Porch No anginal symptoms.   Follow  up with Dr Quentin Ore and Dr Marlou Porch as scheduled.    [facility] [address] [place], [place] [address] [phone] 10/27/2022 3:27 PM

## 2022-10-29 ENCOUNTER — Ambulatory Visit (INDEPENDENT_AMBULATORY_CARE_PROVIDER_SITE_OTHER): Payer: Medicare PPO

## 2022-10-29 VITALS — Ht 64.25 in | Wt 133.0 lb

## 2022-10-29 DIAGNOSIS — Z0000 Encounter for general adult medical examination without abnormal findings: Secondary | ICD-10-CM

## 2022-10-29 DIAGNOSIS — M9901 Segmental and somatic dysfunction of cervical region: Secondary | ICD-10-CM | POA: Diagnosis not present

## 2022-10-29 DIAGNOSIS — M5411 Radiculopathy, occipito-atlanto-axial region: Secondary | ICD-10-CM | POA: Diagnosis not present

## 2022-10-29 NOTE — Patient Instructions (Addendum)
[name] , Thank you for taking time to come for your Medicare Wellness Visit. I appreciate your ongoing commitment to your health goals. Please review the following plan we discussed and let me know if I can assist you in the future.   These are the goals we discussed:  Goals       Patient Stated (pt-stated)      Stay healthy and active.        This is a list of the screening recommended for you and due dates:  Health Maintenance  Topic Date Due   COVID-19 Vaccine (4 - 2023-24 season) 11/14/2022*   Medicare Annual Wellness Visit  10/30/2023   DTaP/Tdap/Td vaccine (4 - Td or Tdap) 06/05/2031   Pneumonia Vaccine  Completed   Flu Shot  Completed   DEXA scan (bone density measurement)  Completed   Hepatitis C Screening: USPSTF Recommendation to screen - Ages 40-79 yo.  Completed   Zoster (Shingles) Vaccine  Completed   HPV Vaccine  Aged Out   Colon Cancer Screening  Discontinued  *Topic was postponed. The date shown is not the original due date.    Advanced directives: Please bring a copy of your health care power of attorney and living will to the office to be added to your chart at your convenience.   Conditions/risks identified: None  Next appointment: Follow up in one year for your annual wellness visit     Preventive Care 65 Years and Older, Female Preventive care refers to lifestyle choices and visits with your health care provider that can promote health and wellness. What does preventive care include? A yearly physical exam. This is also called an annual well check. Dental exams once or twice a year. Routine eye exams. Ask your health care provider how often you should have your eyes checked. Personal lifestyle choices, including: Daily care of your teeth and gums. Regular physical activity. Eating a healthy diet. Avoiding tobacco and drug use. Limiting alcohol use. Practicing safe sex. Taking low-dose aspirin every day. Taking vitamin and mineral supplements as  recommended by your health care provider. What happens during an annual well check? The services and screenings done by your health care provider during your annual well check will depend on your age, overall health, lifestyle risk factors, and family history of disease. Counseling  Your health care provider may ask you questions about your: Alcohol use. Tobacco use. Drug use. Emotional well-being. Home and relationship well-being. Sexual activity. Eating habits. History of falls. Memory and ability to understand (cognition). Work and work Statistician. Reproductive health. Screening  You may have the following tests or measurements: Height, weight, and BMI. Blood pressure. Lipid and cholesterol levels. These may be checked every 5 years, or more frequently if you are over 33 years old. Skin check. Lung cancer screening. You may have this screening every year starting at age 53 if you have a 30-pack-year history of smoking and currently smoke or have quit within the past 15 years. Fecal occult blood test (FOBT) of the stool. You may have this test every year starting at age 70. Flexible sigmoidoscopy or colonoscopy. You may have a sigmoidoscopy every 5 years or a colonoscopy every 10 years starting at age 47. Hepatitis C blood test. Hepatitis B blood test. Sexually transmitted disease (STD) testing. Diabetes screening. This is done by checking your blood sugar (glucose) after you have not eaten for a while (fasting). You may have this done every 1-3 years. Bone density scan. This is  done to screen for osteoporosis. You may have this done starting at age 48. Mammogram. This may be done every 1-2 years. Talk to your health care provider about how often you should have regular mammograms. Talk with your health care provider about your test results, treatment options, and if necessary, the need for more tests. Vaccines  Your health care provider may recommend certain vaccines, such  as: Influenza vaccine. This is recommended every year. Tetanus, diphtheria, and acellular pertussis (Tdap, Td) vaccine. You may need a Td booster every 10 years. Zoster vaccine. You may need this after age 100. Pneumococcal 13-valent conjugate (PCV13) vaccine. One dose is recommended after age 25. Pneumococcal polysaccharide (PPSV23) vaccine. One dose is recommended after age 3. Talk to your health care provider about which screenings and vaccines you need and how often you need them. This information is not intended to replace advice given to you by your health care provider. Make sure you discuss any questions you have with your health care provider. Document Released: 09/21/2015 Document Revised: 05/14/2016 Document Reviewed: 06/26/2015 Elsevier Interactive Patient Education  2017 [place] Prevention in the Home Falls can cause injuries. They can happen to people of all ages. There are many things you can do to make your home safe and to help prevent falls. What can I do on the outside of my home? Regularly fix the edges of walkways and driveways and fix any cracks. Remove anything that might make you trip as you walk through a door, such as a raised step or threshold. Trim any bushes or trees on the path to your home. Use bright outdoor lighting. Clear any walking paths of anything that might make someone trip, such as rocks or tools. Regularly check to see if handrails are loose or broken. Make sure that both sides of any steps have handrails. Any raised decks and porches should have guardrails on the edges. Have any leaves, snow, or ice cleared regularly. Use sand or salt on walking paths during winter. Clean up any spills in your garage right away. This includes oil or grease spills. What can I do in the bathroom? Use night lights. Install grab bars by the toilet and in the tub and shower. Do not use towel bars as grab bars. Use non-skid mats or decals in the tub or  shower. If you need to sit down in the shower, use a plastic, non-slip stool. Keep the floor dry. Clean up any water that spills on the floor as soon as it happens. Remove soap buildup in the tub or shower regularly. Attach bath mats securely with double-sided non-slip rug tape. Do not have throw rugs and other things on the floor that can make you trip. What can I do in the bedroom? Use night lights. Make sure that you have a light by your bed that is easy to reach. Do not use any sheets or blankets that are too big for your bed. They should not hang down onto the floor. Have a firm chair that has side arms. You can use this for support while you get dressed. Do not have throw rugs and other things on the floor that can make you trip. What can I do in the kitchen? Clean up any spills right away. Avoid walking on wet floors. Keep items that you use a lot in easy-to-reach places. If you need to reach something above you, use a strong step stool that has a grab bar. Keep electrical cords out of  the way. Do not use floor polish or wax that makes floors slippery. If you must use wax, use non-skid floor wax. Do not have throw rugs and other things on the floor that can make you trip. What can I do with my stairs? Do not leave any items on the stairs. Make sure that there are handrails on both sides of the stairs and use them. Fix handrails that are broken or loose. Make sure that handrails are as long as the stairways. Check any carpeting to make sure that it is firmly attached to the stairs. Fix any carpet that is loose or worn. Avoid having throw rugs at the top or bottom of the stairs. If you do have throw rugs, attach them to the floor with carpet tape. Make sure that you have a light switch at the top of the stairs and the bottom of the stairs. If you do not have them, ask someone to add them for you. What else can I do to help prevent falls? Wear shoes that: Do not have high heels. Have  rubber bottoms. Are comfortable and fit you well. Are closed at the toe. Do not wear sandals. If you use a stepladder: Make sure that it is fully opened. Do not climb a closed stepladder. Make sure that both sides of the stepladder are locked into place. Ask someone to hold it for you, if possible. Clearly mark and make sure that you can see: Any grab bars or handrails. First and last steps. Where the edge of each step is. Use tools that help you move around (mobility aids) if they are needed. These include: Canes. Walkers. Scooters. Crutches. Turn on the lights when you go into a dark area. Replace any light bulbs as soon as they burn out. Set up your furniture so you have a clear path. Avoid moving your furniture around. If any of your floors are uneven, fix them. If there are any pets around you, be aware of where they are. Review your medicines with your doctor. Some medicines can make you feel dizzy. This can increase your chance of falling. Ask your doctor what other things that you can do to help prevent falls. This information is not intended to replace advice given to you by your health care provider. Make sure you discuss any questions you have with your health care provider. Document Released: 06/21/2009 Document Revised: 01/31/2016 Document Reviewed: 09/29/2014 Elsevier Interactive Patient Education  2017 [employer].

## 2022-10-29 NOTE — Progress Notes (Signed)
Subjective:   Nichole Berg is a 77 y.o. female who presents for Medicare Annual (Subsequent) preventive examination.  Review of Systems    Virtual Visit via Telephone Note  I connected with  [name] on 10/29/22 at 10:30 AM EST by telephone and verified that I am speaking with the correct person using two identifiers.  Location: Patient: Home Provider: Office Persons participating in the virtual visit: patient/Nurse Health Advisor   I discussed the limitations, risks, security and privacy concerns of performing an evaluation and management service by telephone and the availability of in person appointments. The patient expressed understanding and agreed to proceed.  Interactive audio and video telecommunications were attempted between this nurse and patient, however failed, due to patient having technical difficulties OR patient did not have access to video capability.  We continued and completed visit with audio only.  Some vital signs may be absent or patient reported.   Criselda Peaches, LPN  Cardiac Risk Factors include: advanced age (>72mn, >>26women)     Objective:    Today's Vitals   10/29/22 0959  Weight: 133 lb (60.3 kg)  Height: 5' 4.25" (1.632 m)   Body mass index is 22.65 kg/m.     10/29/2022   10:05 AM 04/10/2022    9:55 AM 05/06/2021    2:45 PM 12/10/2020    8:17 AM 10/30/2020    8:48 AM 08/15/2020   10:36 AM 05/23/2020    9:40 AM  Advanced Directives  Does Patient Have a Medical Advance Directive? Yes Yes Yes Yes Yes Yes Yes  Type of AParamed[place]of [place] will Healthcare Power of [place]ear[place]Living will HEaganLiving will HCalamusLiving will HGardnerLiving will HPlattvilleLiving will  Does patient want to make changes to medical advance directive?    No - Patient declined No - Patient declined    Copy of [place] Chart? No - copy requested No - copy requested No - copy requested No - copy requested No - copy requested No - copy requested No - copy requested    Current Medications (verified) Outpatient Encounter Medications as of 10/29/2022  Medication Sig   acetaminophen (TYLENOL) 650 MG CR tablet Take 650 mg by mouth as needed for pain.   apixaban (ELIQUIS) 5 MG TABS tablet TAKE 1 TABLET TWICE DAILY   Carboxymethylcellul-Glycerin (LUBRICATING EYE DROPS OP) Place 1 drop into both eyes daily as needed (dry eyes).   Cholecalciferol (VITAMIN D) 2000 UNITS tablet Take 2,000 Units by mouth daily.   Fluorouracil 5 % SOLN Apply 1 Application topically 2 (two) times daily as needed (precancerous spots).   metoprolol succinate (TOPROL-XL) 50 MG 24 hr tablet Take 1 tablet (50 mg total) by mouth daily. Take with or immediately following a meal.   Multiple Vitamins-Minerals (ADULT GUMMY PO) Take 1 capsule by mouth once a week.   Multiple Vitamins-Minerals (LUTEIN-ZEAXANTHIN PO) Take 1 tablet by mouth daily.   No facility-administered encounter medications on file as of 10/29/2022.    Allergies (verified) Pravastatin, Rosuvastatin, and Sulfonamide derivatives   History: Past Medical History:  Diagnosis Date   Cataract 10/18/2011   Followed by opthalmology.    COLONIC POLYPS, HX OF 10/12/2009   COVID-19 03/2020   HYPERLIPIDEMIA 05/31/2007   NEPHROLITHIASIS, HX OF 05/31/2007   Past Surgical History:  Procedure Laterality Date   ATRIAL FIBRILLATION ABLATION N/A 12/10/2020   Procedure: ATRIAL  FIBRILLATION ABLATION;  Surgeon: Vickie Epley, MD;  Location: [facility];  Service: Cardiovascular;  Laterality: N/A;   CARDIOVERSION N/A 08/15/2020   Procedure: CARDIOVERSION;  Surgeon: Freada Bergeron, MD;  Location[facility] ENDOSCOPY;  Service: Cardiovascular;  Laterality: N/A;   CARDIOVERSION N/A 04/10/2022   Procedure: CARDIOVERSION;  Surgeon: Werner Lean, MD;  Location: [facility]  ENDOSCOPY;  Service: Cardiovascular;  Laterality: N/A;   CATARACT EXTRACTION, BILATERAL     Family History  Problem Relation Age of Onset   Atrial fibrillation Mother        Has pacer doing very well age 57 passed suddnely age 42 poss CV   Social History   Socioeconomic History   Marital status: Single    Spouse name: Not on file   Number of children: Not on file   Years of education: Not on file   Highest education level: Not on file  Occupational History   Occupation: Retired  Tobacco Use   Smoking status: Former    Years: 2.00    Types: Cigarettes    Quit date: 09/08/1964    Years since quitting: 58.1   Smokeless tobacco: Never   Tobacco comments:    Former smoker 03/18/22  Vaping Use   Vaping Use: Never used  Substance and Sexual Activity   Alcohol use: Yes    Comment: occassional   Drug use: No   Sexual activity: Not on file  Other Topics Concern   Not on file  Social History Narrative   Retired Designer, jewellery    Non smoker   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    [employer] 2 1 cat neg tob      Social Determinants of Health   Financial Resource Strain: [place]  (10/29/2022)   Overall Financial Resource Strain (CARDIA)    Difficulty of Paying Living Expenses: Not hard at all  Food Insecurity: No [place] (10/29/2022)   Hunger Vital Sign    Worried About Running Out of Food in the Last Year: Never true    [place] in the Last Year: Never true  Transportation Needs: No Transportation Needs (10/29/2022)   PRAPARE - Hydrologist (Medical): No    Lack of Transportation (Non-Medical): No  Physical Activity: Sufficiently Active (10/29/2022)   Exercise Vital Sign    Days of Exercise per Week: 7 days    Minutes of Exercise per Session: 60 min  Stress: No Stress Concern Present (10/29/2022)   [place]    Feeling of Stress : Not at all  Social  Connections: Moderately Isolated (10/29/2022)   Social Connection and Isolation Panel [NHANES]    Frequency of Communication with Friends and Family: More than three times a week    Frequency of Social Gatherings with Friends and Family: More than three times a week    Attends Religious Services: Never    Marine scientist or Organizations: No    Attends Archivist Meetings: Never    Marital Status: Living with partner    Tobacco Counseling Counseling given: Not Answered Tobacco comments: Former smoker 03/18/22   Clinical Intake:  Pre-visit preparation completed: No  Pain : No/denies pain     Nutritional Risks: None Diabetes: No  How often do you need to have someone help you when you read instructions, pamphlets, or other written materials from your doctor or pharmacy?: 1 -  Never  Diabetic?  No  Interpreter Needed?: No  Information entered by :: Rolene Arbour LPN   Activities of Daily Living    10/29/2022   10:04 AM 10/27/2022   11:01 AM  In your present state of health, do you have any difficulty performing the following activities:  Hearing? 0 0  Vision? 0 0  Difficulty concentrating or making decisions? 0 0  Walking or climbing stairs? 0 0  Dressing or bathing? 0 0  Doing errands, shopping? 0 0  Preparing Food and eating ? N N  Using the Toilet? N N  In the past six months, have you accidently leaked urine? N Y  Do you have problems with loss of bowel control? N N  Managing your Medications? N N  Managing your Finances? N N  Housekeeping or managing your Housekeeping? N N    Patient Care Team: Panosh, Standley Brooking, MD as PCP - General Jerline Pain, MD as PCP - Cardiology (Cardiology) Vickie Epley, MD as PCP - Electrophysiology (Cardiology) Richmond Campbell, MD as Consulting Physician (Gastroenterology) Shon Hough, MD as Consulting Physician (Ophthalmology)  Indicate any recent Medical Services you may have received from other  than [facility] providers in the past year (date may be approximate).     Assessment:   This is a routine wellness examination for [place].  Hearing/Vision screen Hearing Screening - Comments:: Denies hearing difficulties   Vision Screening - Comments::  - up to date with routine eye exams with  Dr Prudencio Burly  Dietary issues and exercise activities discussed: Exercise limited by: None identified   Goals Addressed               This Visit's Progress     Patient Stated (pt-stated)        Stay healthy and active.       Depression Screen    10/29/2022   10:03 AM 09/02/2022    8:58 AM 08/26/2021    1:39 PM 05/06/2021    2:43 PM 05/23/2020    9:36 AM 08/10/2019    2:44 PM 07/28/2018   10:32 AM  PHQ 2/9 Scores  PHQ - 2 Score 0 0 0 0 0 0 0  PHQ- 9 Score 0 1 0        Fall Risk    10/29/2022   10:04 AM 10/27/2022   11:01 AM 09/02/2022    8:18 AM 08/26/2021    1:39 PM 05/06/2021    2:46 PM  Fall Risk   Falls in the past year? 0 0 0 0 0  Number falls in past yr: 0 0 0  0  Injury with Fall? 0 0 0  0  Risk for fall due to : No Fall Risks  No Fall Risks  No Fall Risks  Follow up Falls prevention discussed  Falls evaluation completed  Falls evaluation completed    FALL RISK PREVENTION PERTAINING TO THE HOME:  Any stairs in or around the home? Yes  If so, are there any without handrails? No  Home free of loose throw rugs in walkways, pet beds, electrical cords, etc? Yes  Adequate lighting in your home to reduce risk of falls? Yes   ASSISTIVE DEVICES UTILIZED TO PREVENT FALLS:  Life alert? No  Use of a cane, walker or w/c? No  Grab bars in the bathroom? Yes Shower chair or bench in shower? No  Elevated toilet seat or a handicapped toilet? Yes   TIMED UP AND GO:  Was the  test performed? No . Audio Visit  Cognitive Function:        10/29/2022   10:05 AM 05/23/2020    9:46 AM  6CIT Screen  What Year? 0 points   What month? 0 points   What time? 0 points   Count back from  20 0 points 0 points  Months in reverse 0 points 0 points  Repeat phrase 0 points 2 points  Total Score 0 points     Immunizations Immunization History  Administered Date(s) Administered   Fluad Quad(high Dose 65+) 07/03/2022   Influenza Whole 08/14/2010   Influenza, High Dose Seasonal PF 07/23/2016, 07/12/2019, 06/10/2020   Influenza,inj,Quad PF,6+ Mos 06/16/2014   Influenza-Unspecified 06/23/2017, 06/04/2021   PFIZER(Purple Top)SARS-COV-2 Vaccination 09/21/2019, 10/12/2019   Pfizer Covid-19 Vaccine Bivalent Booster 16yr & up 06/25/2021   Pneumococcal Conjugate-13 06/16/2014   Pneumococcal Polysaccharide-23 10/12/2009   Td 09/09/2003, 10/12/2009   Tdap 06/04/2021   Unspecified SARS-COV-2 Vaccination 08/07/2021   Zoster Recombinat (Shingrix) 06/23/2017, 10/20/2017    TDAP status: Up to date  Flu Vaccine status: Up to date  Pneumococcal vaccine status: Up to date  Covid-19 vaccine status: Completed vaccines  Qualifies for Shingles Vaccine? Yes   Zostavax completed Yes   Shingrix Completed?: Yes  Screening Tests Health Maintenance  Topic Date Due   COVID-19 Vaccine (4 - 2023-24 season) 11/14/2022 (Originally 05/09/2022)   Medicare Annual Wellness (AWV)  10/30/2023   DTaP/Tdap/Td (4 - Td or Tdap) 06/05/2031   Pneumonia Vaccine 79 Years old  Completed   INFLUENZA VACCINE  Completed   DEXA SCAN  Completed   Hepatitis C Screening  Completed   Zoster Vaccines- Shingrix  Completed   HPV VACCINES  Aged Out   COLONOSCOPY (Pts 45-441yrInsurance coverage will need to be confirmed)  Discontinued    Health Maintenance  There are no preventive care reminders to display for this patient.   Colorectal cancer screening: No longer required.   Mammogram status: No longer required due to Age.  Bone Density status: Completed 09/07/19. Results reflect: Bone density results: OSTEOPOROSIS. Repeat every   years.  Lung Cancer Screening: (Low Dose CT Chest recommended if Age 752-80years, 30 pack-year currently smoking OR have quit w/in 15years.) does not qualify.     Additional Screening:  Hepatitis C Screening: does qualify; Completed 917/18  Vision Screening: Recommended annual ophthalmology exams for early detection of glaucoma and other disorders of the eye. Is the patient up to date with their annual eye exam?  Yes  Who is the provider or what is the name of the office in which the patient attends annual eye exams? Dr LyPrudencio Burlyf pt is not established with a provider, would they like to be referred to a provider to establish care? No .   Dental Screening: Recommended annual dental exams for proper oral hygiene  Community Resource Referral / Chronic Care Management:  CRR required this visit?  No   CCM required this visit?  No      Plan:     I have personally reviewed and noted the following in the patient's chart:   Medical and social history Use of alcohol, tobacco or illicit drugs  Current medications and supplements including opioid prescriptions. Patient is not currently taking opioid prescriptions. Functional ability and status Nutritional status Physical activity Advanced directives List of other physicians Hospitalizations, surgeries, and ER visits in previous 12 months Vitals Screenings to include cognitive, depression, and falls Referrals and appointments  In addition, I  have reviewed and discussed with patient certain preventive protocols, quality metrics, and best practice recommendations. A written personalized care plan for preventive services as well as general preventive health recommendations were provided to patient.     Criselda Peaches, LPN   [id number]   Nurse Notes: None

## 2022-11-03 NOTE — Progress Notes (Unsigned)
  Electrophysiology Office Follow up Visit Note:    Date:  11/03/2022   ID:  Nichole Berg, DOB [birth year], MRN [id number]  PCP:  Burnis Medin, MD  [facility] Cardiologist:  Candee Furbish, MD  [facility] Electrophysiologist:  Vickie Epley, MD    Interval History:    Nichole Berg is a 77 y.o. female who presents for a follow up visit. They were last seen in clinic 05/02/2022 for persistnet AF. Prior ablation 12/10/2020. She saw [facility] 10/27/2022. At that appointment she reported recurrence of atypical atrial flutter.  She was symptomatic with AFL. Options for rhythm control were discussed at the last appointment and she wished to discuss redo catheter ablation.       Past medical, surgical, social and family history were reviewed.  Current Medications: No outpatient medications have been marked as taking for the 11/04/22 encounter (Appointment) with Vickie Epley, MD.     Allergies:   Pravastatin, Rosuvastatin, and Sulfonamide derivatives   ROS:   Please see the history of present illness.    All other systems reviewed and are negative.  EKGs/Labs/Other Studies Reviewed:    The following studies were reviewed today:    EKG:  The ekg ordered today demonstrates ***   Physical Exam:    VS:  There were no vitals taken for this visit.    Wt Readings from Last 3 Encounters:  10/29/22 133 lb (60.3 kg)  10/27/22 134 lb 12.8 oz (61.1 kg)  09/02/22 133 lb 11.2 oz (60.6 kg)     GEN: *** Well nourished, well developed in no acute distress CARDIAC: ***RRR, no murmurs, rubs, gallops RESPIRATORY:  Clear to auscultation without rales, wheezing or rhonchi       ASSESSMENT:    No diagnosis found. PLAN:    In order of problems listed above:  #Persistent AF and AFL Symptomatic. Recurrent after 2021 ablation. Continue [place].  Recheck veins and posterior wall. Add CTI.    #Hypertension *** goal today.  Recommend checking blood pressures 1-2 times per week at home  and recording the values.  Recommend bringing these recordings to the primary care physician.     Medication Adjustments/Labs and Tests Ordered: Current medicines are reviewed at length with the patient today.  Concerns regarding medicines are outlined above.  No orders of the defined types were placed in this encounter.  No orders of the defined types were placed in this encounter.    Signed, Lars Mage, MD, [facility], [facility] 11/03/2022 2:26 PM    Electrophysiology [facility]

## 2022-11-04 ENCOUNTER — Encounter: Payer: Self-pay | Admitting: Cardiology

## 2022-11-04 ENCOUNTER — Ambulatory Visit: Payer: Medicare PPO | Attending: Cardiology | Admitting: Cardiology

## 2022-11-04 VITALS — BP 108/68 | HR 111 | Ht 65.0 in | Wt 133.6 lb

## 2022-11-04 DIAGNOSIS — I4819 Other persistent atrial fibrillation: Secondary | ICD-10-CM | POA: Diagnosis not present

## 2022-11-04 DIAGNOSIS — I10 Essential (primary) hypertension: Secondary | ICD-10-CM | POA: Diagnosis not present

## 2022-11-04 DIAGNOSIS — I484 Atypical atrial flutter: Secondary | ICD-10-CM

## 2022-11-04 NOTE — Patient Instructions (Signed)
Medication Instructions:  Your physician recommends that you continue on your current medications as directed. Please refer to the Current Medication list given to you today.  *If you need a refill on your cardiac medications before your next appointment, please call your pharmacy*  Lab Work: BMET and CBC on Monday March 18th between 7:30am and 5:00pm   Testing/Procedures: Your physician has requested that you have cardiac CT. Cardiac computed tomography (CT) is a painless test that uses an x-ray machine to take clear, detailed pictures of your heart. Please follow instruction sheet as given.We will call you to arrange you CT scan. It will be done about a week prior to your ablation.   Your physician has recommended that you have an ablation. Catheter ablation is a medical procedure used to treat some cardiac arrhythmias (irregular heartbeats). During catheter ablation, a long, thin, flexible tube is put into a blood vessel in your groin (upper thigh), or neck. This tube is called an ablation catheter. It is then guided to your heart through the blood vessel. Radio frequency waves destroy small areas of heart tissue where abnormal heartbeats may cause an arrhythmia to start. Please see the instruction sheet given to you today.  Follow-Up: At [facility], you and your health needs are our priority.  As part of our continuing mission to provide you with exceptional heart care, we have created designated Provider Care Teams.  These Care Teams include your primary Cardiologist (physician) and Advanced Practice Providers (APPs -  Physician Assistants and Nurse Practitioners) who all work together to provide you with the care you need, when you need it.  Your next appointment:   We will call you to arrange follow up appointments.

## 2022-11-04 NOTE — Progress Notes (Signed)
Electrophysiology Office Follow up Visit Note:    Date:  11/04/2022   ID:  Nichole Berg, DOB 1946-06-10, MRN [id number]  PCP:  Burnis Medin, MD  [facility] Cardiologist:  Candee Furbish, MD  [facility] Electrophysiologist:  Vickie Epley, MD    Interval History:    Nichole Berg is a 77 y.o. female who presents for a follow up visit. They were last seen in clinic 05/02/2022 for persistnet AF. Prior ablation 12/10/2020. She saw [facility] 10/27/2022. At that appointment she reported recurrence of atypical atrial flutter.  She was symptomatic with AFL. Options for rhythm control were discussed at the last appointment and she wished to discuss redo catheter ablation.  Today, she confirms that her FitBit started detecting increased heart rates on 2/12. It had been about 7 months since her previous arrhythmic episode. She states that her pulse has not been as fast as it was during her prior episode. We discussed the procedural details of redo catheter ablation at length. She is amenable to proceeding.  Of note, she already has a cardioversion scheduled for 11/18/2022.  She denies any chest pain, shortness of breath, or peripheral edema. No lightheadedness, headaches, syncope, orthopnea, or PND.     Past medical, surgical, social and family history were reviewed.  Current Medications: Current Meds  Medication Sig   acetaminophen (TYLENOL) 650 MG CR tablet Take 650 mg by mouth as needed for pain.   apixaban (ELIQUIS) 5 MG TABS tablet TAKE 1 TABLET TWICE DAILY   Carboxymethylcellul-Glycerin (LUBRICATING EYE DROPS OP) Place 1 drop into both eyes daily as needed (dry eyes).   Cholecalciferol (VITAMIN D) 2000 UNITS tablet Take 2,000 Units by mouth daily.   Fluorouracil 5 % SOLN Apply 1 Application topically 2 (two) times daily as needed (precancerous spots).   metoprolol succinate (TOPROL-XL) 50 MG 24 hr tablet Take 1 tablet (50 mg total) by mouth daily. Take with or immediately following a meal.    Multiple Vitamins-Minerals (ADULT GUMMY PO) Take 1 capsule by mouth once a week.   Multiple Vitamins-Minerals (LUTEIN-ZEAXANTHIN PO) Take 1 tablet by mouth daily.     Allergies:   Pravastatin, Rosuvastatin, and Sulfonamide derivatives   ROS:   Please see the history of present illness.    All other systems reviewed and are negative.  EKGs/Labs/Other Studies Reviewed:    The following studies were reviewed today:    EKG:  The ekg ordered today demonstrates atypical atrial flutter with a v rate 110 bpm.   Physical Exam:    VS:  BP 108/68   Pulse (!) 111   Ht '5\' 5"'$  (1.651 m)   Wt 133 lb 9.6 oz (60.6 kg)   SpO2 99%   BMI 22.23 kg/m     Wt Readings from Last 3 Encounters:  11/04/22 133 lb 9.6 oz (60.6 kg)  10/29/22 133 lb (60.3 kg)  10/27/22 134 lb 12.8 oz (61.1 kg)     GEN: Well nourished, well developed in no acute distress CARDIAC: regular, tachycardic, no murmurs, rubs, gallops RESPIRATORY:  Clear to auscultation without rales, wheezing or rhonchi       ASSESSMENT:    1. Atypical atrial flutter (HCC)   2. Persistent atrial fibrillation ([place])   3. Essential hypertension    PLAN:    In order of problems listed above:  #Persistent AF and AFL Symptomatic. Recurrent after 2021 ablation. Continue [place]. Treatment options were discussed with the patient including redo catheter ablation and antiarrhythmic drugs.  She would  like to proceed with catheter ablation in an effort to avoid long-term exposure to antiarrhythmic drugs if possible.  I think this is very reasonable.  Will cancel her March cardioversion and plan for early ablation early in April so that I can map this flutter circuit. Ablation strategy will be to map atypical atrial flutter and ablate. Recheck veins and posterior wall. Add CTI.  Discussed treatment options today for their AF/AFL including antiarrhythmic drug therapy and ablation. Discussed risks, recovery and likelihood of success. Discussed  potential need for repeat ablation procedures and antiarrhythmic drugs after an initial ablation. They wish to proceed with scheduling.  Risk, benefits, and alternatives to EP study and radiofrequency ablation for afib were also discussed in detail today. These risks include but are not limited to stroke, bleeding, vascular damage, tamponade, perforation, damage to the esophagus, lungs, and other structures, pulmonary vein stenosis, worsening renal function, and death. The patient understands these risk and wishes to proceed.  We will therefore proceed with catheter ablation at the next available time.  Carto, ICE, anesthesia are requested for the procedure.  Will also obtain CT PV protocol prior to the procedure to exclude LAA thrombus and further evaluate atrial anatomy.    #Hypertension at goal today.  Recommend checking blood pressures 1-2 times per week at home and recording the values.  Recommend bringing these recordings to the primary care physician.  I,Mathew Stumpf,acting as a Education administrator for Vickie Epley, MD.,have documented all relevant documentation on the behalf of Vickie Epley, MD,as directed by  Vickie Epley, MD while in the presence of Vickie Epley, MD.  I, Vickie Epley, MD, have reviewed all documentation for this visit. The documentation on 11/04/22 for the exam, diagnosis, procedures, and orders are all accurate and complete.   Signed, Lars Mage, MD, [facility], [facility] 11/04/2022 8:25 AM    Electrophysiology [facility]

## 2022-11-14 ENCOUNTER — Other Ambulatory Visit (HOSPITAL_COMMUNITY): Payer: Medicare PPO | Admitting: Physician Assistant

## 2022-11-17 ENCOUNTER — Ambulatory Visit: Payer: Medicare PPO | Admitting: Cardiology

## 2022-11-18 ENCOUNTER — Encounter (HOSPITAL_COMMUNITY): Payer: Self-pay

## 2022-11-18 ENCOUNTER — Ambulatory Visit (HOSPITAL_COMMUNITY): Admit: 2022-11-18 | Payer: Medicare PPO | Admitting: Cardiovascular Disease

## 2022-11-18 SURGERY — CARDIOVERSION
Anesthesia: Monitor Anesthesia Care

## 2022-11-24 ENCOUNTER — Ambulatory Visit: Payer: Medicare PPO | Attending: Cardiology

## 2022-11-24 DIAGNOSIS — Z961 Presence of intraocular lens: Secondary | ICD-10-CM | POA: Diagnosis not present

## 2022-11-24 DIAGNOSIS — H04123 Dry eye syndrome of bilateral lacrimal glands: Secondary | ICD-10-CM | POA: Diagnosis not present

## 2022-11-24 DIAGNOSIS — I484 Atypical atrial flutter: Secondary | ICD-10-CM

## 2022-11-24 DIAGNOSIS — I10 Essential (primary) hypertension: Secondary | ICD-10-CM | POA: Diagnosis not present

## 2022-11-24 DIAGNOSIS — H52203 Unspecified astigmatism, bilateral: Secondary | ICD-10-CM | POA: Diagnosis not present

## 2022-11-24 DIAGNOSIS — M5411 Radiculopathy, occipito-atlanto-axial region: Secondary | ICD-10-CM | POA: Diagnosis not present

## 2022-11-24 DIAGNOSIS — M9901 Segmental and somatic dysfunction of cervical region: Secondary | ICD-10-CM | POA: Diagnosis not present

## 2022-11-24 DIAGNOSIS — I4819 Other persistent atrial fibrillation: Secondary | ICD-10-CM | POA: Diagnosis not present

## 2022-11-24 DIAGNOSIS — H524 Presbyopia: Secondary | ICD-10-CM | POA: Diagnosis not present

## 2022-11-25 ENCOUNTER — Encounter: Payer: Self-pay | Admitting: Cardiology

## 2022-11-25 LAB — CBC WITH DIFFERENTIAL/PLATELET
Basophils Absolute: 0 10*3/uL (ref 0.0–0.2)
Basos: 1 %
EOS (ABSOLUTE): 0.1 10*3/uL (ref 0.0–0.4)
Eos: 1 %
Hematocrit: 37.4 % (ref 34.0–46.6)
Hemoglobin: 12.3 g/dL (ref 11.1–15.9)
Immature Grans (Abs): 0 10*3/uL (ref 0.0–0.1)
Immature Granulocytes: 0 %
Lymphocytes Absolute: 1.2 10*3/uL (ref 0.7–3.1)
Lymphs: 29 %
MCH: 31.1 pg (ref 26.6–33.0)
MCHC: 32.9 g/dL (ref 31.5–35.7)
MCV: 95 fL (ref 79–97)
Monocytes Absolute: 0.5 10*3/uL (ref 0.1–0.9)
Monocytes: 12 %
Neutrophils Absolute: 2.3 10*3/uL (ref 1.4–7.0)
Neutrophils: 57 %
Platelets: 253 10*3/uL (ref 150–450)
RBC: 3.95 x10E6/uL (ref 3.77–5.28)
RDW: 11.9 % (ref 11.7–15.4)
WBC: 4.1 10*3/uL (ref 3.4–10.8)

## 2022-11-25 LAB — BASIC METABOLIC PANEL
BUN/Creatinine Ratio: 21 (ref 12–28)
BUN: 15 mg/dL (ref 8–27)
CO2: 25 mmol/L (ref 20–29)
Calcium: 9.3 mg/dL (ref 8.7–10.3)
Chloride: 100 mmol/L (ref 96–106)
Creatinine, Ser: 0.71 mg/dL (ref 0.57–1.00)
Glucose: 88 mg/dL (ref 70–99)
Potassium: 4.9 mmol/L (ref 3.5–5.2)
Sodium: 139 mmol/L (ref 134–144)
eGFR: 86 mL/min/{1.73_m2} (ref >59)

## 2022-12-04 ENCOUNTER — Telehealth (HOSPITAL_COMMUNITY): Payer: Self-pay | Admitting: Emergency Medicine

## 2022-12-04 NOTE — Telephone Encounter (Signed)
Attempted to call patient regarding upcoming cardiac CT appointment. °Left message on voicemail with name and callback number °Mareli Antunes RN Navigator Cardiac Imaging °[facility] °[phone] Office °[phone] Cell ° °

## 2022-12-05 ENCOUNTER — Ambulatory Visit (HOSPITAL_COMMUNITY)
Admission: RE | Admit: 2022-12-05 | Discharge: 2022-12-05 | Disposition: A | Discharge status: Home / Self Care | Payer: Medicare PPO | Source: Ambulatory Visit | Attending: Cardiology | Admitting: Cardiology

## 2022-12-05 DIAGNOSIS — I484 Atypical atrial flutter: Secondary | ICD-10-CM

## 2022-12-05 DIAGNOSIS — I10 Essential (primary) hypertension: Secondary | ICD-10-CM | POA: Diagnosis not present

## 2022-12-05 DIAGNOSIS — I4819 Other persistent atrial fibrillation: Secondary | ICD-10-CM

## 2022-12-05 MED ORDER — DIPHENHYDRAMINE HCL 50 MG/ML IJ SOLN: 50.0000 mg | Freq: Once | INTRAMUSCULAR | Status: AC

## 2022-12-05 MED ORDER — DIPHENHYDRAMINE HCL 50 MG/ML IJ SOLN
INTRAMUSCULAR | Status: AC
  Administered 2022-12-05: 50 mg
  Filled 2022-12-05: qty 1

## 2022-12-05 MED ORDER — DILTIAZEM HCL 25 MG/5ML IV SOLN
INTRAVENOUS | Status: AC
  Administered 2022-12-05: 5 mg via INTRAVENOUS
  Filled 2022-12-05: qty 5

## 2022-12-05 MED ORDER — DILTIAZEM HCL 25 MG/5ML IV SOLN: 10.0000 mg | INTRAVENOUS | Status: DC | PRN

## 2022-12-05 MED ORDER — IOHEXOL 350 MG/ML SOLN
100.0000 mL | Freq: Once | INTRAVENOUS | Status: AC | PRN
  Administered 2022-12-05: 100 mL via INTRAVENOUS

## 2022-12-05 NOTE — Progress Notes (Signed)
Patient had IV contrast in CT1  and after developed itching and rash bilateral flank and itching under arms and bilateral lower extremities. Denies shortness of breath. PA assessed patient ordered IV benadryl 50mg  and to observe for an hour.

## 2022-12-05 NOTE — Plan of Care (Signed)
Called to CT1 for patient with reports of sudden onset sneezing and diffuse itching after IV contrast administration.   Patient sitting up on CT table, answers all questions appropriately, able to speak in full sentences. She denies any history of contrast reaction, her only known allergy is sulfa which causes itching. She denies dyspnea, chest pain, wheezing, tongue swelling, facial swelling, nausea, vomiting. She is A&Ox4, hoping to be able to leave soon for a camping trip today.  ~5 small hives noted on lower back near waistband of pants. No hives noted elsewhere on the body. No facial or oral edema. No audible wheezing. She continues to have diffuse bodily itching but feels that is it improving. Sneezing has resolved.   Plan: - Benadryl 50 mg IV x 1 - Observe x 1 hour then may discharge home if no worsening of hives or additional symptoms   Discussed with patient who is agreeable.  Candiss Norse, PA-C

## 2022-12-05 NOTE — Progress Notes (Signed)
Patient states itching improved and now feels tired due to the benadryl. Bilateral flank areas skin hives improved.

## 2022-12-11 NOTE — Pre-Procedure Instructions (Signed)
Attempted to call patient regarding procedure instructions for tomorrow.  Left voicemail on the following items: Arrival time 1130 Nothing to eat or drink after midnight No meds AM of procedure Responsible person to drive you home and stay with you for 24 hrs  Have you missed any doses of anti-coagulant Eliquis- if you have missed any doses please let the office know right away.

## 2022-12-12 ENCOUNTER — Ambulatory Visit (HOSPITAL_BASED_OUTPATIENT_CLINIC_OR_DEPARTMENT_OTHER): Payer: Medicare PPO | Admitting: Certified Registered"

## 2022-12-12 ENCOUNTER — Ambulatory Visit (HOSPITAL_COMMUNITY): Payer: Medicare PPO | Admitting: Certified Registered"

## 2022-12-12 ENCOUNTER — Other Ambulatory Visit: Payer: Self-pay

## 2022-12-12 ENCOUNTER — Other Ambulatory Visit (HOSPITAL_COMMUNITY): Payer: Self-pay

## 2022-12-12 ENCOUNTER — Ambulatory Visit (HOSPITAL_COMMUNITY)
Admission: RE | Admit: 2022-12-12 | Discharge: 2022-12-12 | Disposition: A | Discharge status: Home / Self Care | Payer: Medicare PPO | Attending: Cardiology | Admitting: Cardiology

## 2022-12-12 ENCOUNTER — Encounter (HOSPITAL_COMMUNITY)
Admission: RE | Disposition: A | Discharge status: Home / Self Care | Payer: Self-pay | Source: Home / Self Care | Attending: Cardiology

## 2022-12-12 DIAGNOSIS — Z7901 Long term (current) use of anticoagulants: Secondary | ICD-10-CM | POA: Diagnosis not present

## 2022-12-12 DIAGNOSIS — I4819 Other persistent atrial fibrillation: Secondary | ICD-10-CM | POA: Insufficient documentation

## 2022-12-12 DIAGNOSIS — I4891 Unspecified atrial fibrillation: Secondary | ICD-10-CM

## 2022-12-12 DIAGNOSIS — I10 Essential (primary) hypertension: Secondary | ICD-10-CM | POA: Diagnosis not present

## 2022-12-12 DIAGNOSIS — I484 Atypical atrial flutter: Secondary | ICD-10-CM | POA: Insufficient documentation

## 2022-12-12 HISTORY — PX: ATRIAL FIBRILLATION ABLATION: EP1191

## 2022-12-12 LAB — POCT ACTIVATED CLOTTING TIME: Activated Clotting Time: 271 seconds

## 2022-12-12 SURGERY — ATRIAL FIBRILLATION ABLATION
Anesthesia: General

## 2022-12-12 MED ORDER — COLCHICINE 0.6 MG PO TABS
0.6000 mg | ORAL_TABLET | Freq: Two times a day (BID) | ORAL | Status: DC
  Administered 2022-12-12: 0.6 mg via ORAL
  Filled 2022-12-12: qty 1

## 2022-12-12 MED ORDER — APIXABAN 5 MG PO TABS
5.0000 mg | ORAL_TABLET | Freq: Two times a day (BID) | ORAL | Status: DC
  Administered 2022-12-12: 5 mg via ORAL
  Filled 2022-12-12: qty 1

## 2022-12-12 MED ORDER — PROPOFOL 10 MG/ML IV BOLUS
INTRAVENOUS | Status: DC | PRN
  Administered 2022-12-12: 100 mg via INTRAVENOUS

## 2022-12-12 MED ORDER — DEXAMETHASONE SODIUM PHOSPHATE 10 MG/ML IJ SOLN
INTRAMUSCULAR | Status: DC | PRN
  Administered 2022-12-12: 10 mg via INTRAVENOUS

## 2022-12-12 MED ORDER — LIDOCAINE 2% (20 MG/ML) 5 ML SYRINGE
INTRAMUSCULAR | Status: DC | PRN
  Administered 2022-12-12: 60 mg via INTRAVENOUS

## 2022-12-12 MED ORDER — ROCURONIUM BROMIDE 10 MG/ML (PF) SYRINGE
PREFILLED_SYRINGE | INTRAVENOUS | Status: DC | PRN
  Administered 2022-12-12: 40 mg via INTRAVENOUS

## 2022-12-12 MED ORDER — SODIUM CHLORIDE 0.9 % IV SOLN: 250.0000 mL | INTRAVENOUS | Status: DC | PRN

## 2022-12-12 MED ORDER — PANTOPRAZOLE SODIUM 40 MG PO TBEC
40.0000 mg | DELAYED_RELEASE_TABLET | Freq: Every day | ORAL | 0 refills | Status: DC
  Filled 2022-12-12: qty 45, 45d supply, fill #0

## 2022-12-12 MED ORDER — PHENYLEPHRINE 80 MCG/ML (10ML) SYRINGE FOR IV PUSH (FOR BLOOD PRESSURE SUPPORT)
PREFILLED_SYRINGE | INTRAVENOUS | Status: DC | PRN
  Administered 2022-12-12 (×2): 80 ug via INTRAVENOUS

## 2022-12-12 MED ORDER — METOPROLOL SUCCINATE ER 50 MG PO TB24
50.0000 mg | ORAL_TABLET | Freq: Every day | ORAL | Status: DC
  Administered 2022-12-12: 50 mg via ORAL
  Filled 2022-12-12: qty 1

## 2022-12-12 MED ORDER — ACETAMINOPHEN 325 MG PO TABS: 650.0000 mg | ORAL_TABLET | ORAL | Status: DC | PRN

## 2022-12-12 MED ORDER — PROTAMINE SULFATE 10 MG/ML IV SOLN
INTRAVENOUS | Status: DC | PRN
  Administered 2022-12-12: 30 mg via INTRAVENOUS

## 2022-12-12 MED ORDER — SODIUM CHLORIDE 0.9% FLUSH: 3.0000 mL | Freq: Two times a day (BID) | INTRAVENOUS | Status: DC

## 2022-12-12 MED ORDER — COLCHICINE 0.6 MG PO TABS
0.6000 mg | ORAL_TABLET | Freq: Two times a day (BID) | ORAL | 0 refills | Status: DC
  Filled 2022-12-12: qty 10, 5d supply, fill #0

## 2022-12-12 MED ORDER — ONDANSETRON HCL 4 MG/2ML IJ SOLN
INTRAMUSCULAR | Status: DC | PRN
  Administered 2022-12-12: 4 mg via INTRAVENOUS

## 2022-12-12 MED ORDER — ONDANSETRON HCL 4 MG/2ML IJ SOLN: 4.0000 mg | Freq: Four times a day (QID) | INTRAMUSCULAR | Status: DC | PRN

## 2022-12-12 MED ORDER — FENTANYL CITRATE (PF) 250 MCG/5ML IJ SOLN
INTRAMUSCULAR | Status: DC | PRN
  Administered 2022-12-12: 100 ug via INTRAVENOUS

## 2022-12-12 MED ORDER — HEPARIN SODIUM (PORCINE) 1000 UNIT/ML IJ SOLN
INTRAMUSCULAR | Status: DC | PRN
  Administered 2022-12-12: 9000 [IU] via INTRAVENOUS
  Administered 2022-12-12: 4000 [IU] via INTRAVENOUS

## 2022-12-12 MED ORDER — HEPARIN (PORCINE) IN NACL 1000-0.9 UT/500ML-% IV SOLN
INTRAVENOUS | Status: DC | PRN
  Administered 2022-12-12 (×3): 500 mL

## 2022-12-12 MED ORDER — SODIUM CHLORIDE 0.9 % IV SOLN: INTRAVENOUS | Status: DC

## 2022-12-12 MED ORDER — PHENYLEPHRINE HCL-NACL 20-0.9 MG/250ML-% IV SOLN
INTRAVENOUS | Status: DC | PRN
  Administered 2022-12-12: 20 ug/min via INTRAVENOUS

## 2022-12-12 MED ORDER — SODIUM CHLORIDE 0.9% FLUSH: 3.0000 mL | INTRAVENOUS | Status: DC | PRN

## 2022-12-12 MED ORDER — SUGAMMADEX SODIUM 200 MG/2ML IV SOLN
INTRAVENOUS | Status: DC | PRN
  Administered 2022-12-12: 200 mg via INTRAVENOUS

## 2022-12-12 MED ORDER — HEPARIN SODIUM (PORCINE) 1000 UNIT/ML IJ SOLN
INTRAMUSCULAR | Status: DC | PRN
  Administered 2022-12-12: 1000 [IU] via INTRAVENOUS

## 2022-12-12 MED ORDER — HEPARIN SODIUM (PORCINE) 1000 UNIT/ML IJ SOLN
INTRAMUSCULAR | Status: AC
  Filled 2022-12-12: qty 10

## 2022-12-12 MED ORDER — PANTOPRAZOLE SODIUM 40 MG PO TBEC
40.0000 mg | DELAYED_RELEASE_TABLET | Freq: Every day | ORAL | Status: DC
  Administered 2022-12-12: 40 mg via ORAL
  Filled 2022-12-12: qty 1

## 2022-12-12 SURGICAL SUPPLY — 20 items
BAG SNAP BAND KOVER 36X36 (MISCELLANEOUS) IMPLANT
CATH 8FR REPROCESSED SOUNDSTAR (CATHETERS) ×1 IMPLANT
CATH 8FR SOUNDSTAR REPROCESSED (CATHETERS) IMPLANT
CATH ABLAT QDOT MICRO BI TC DF (CATHETERS) IMPLANT
CATH OCTARAY 2.0 F 3-3-3-3-3 (CATHETERS) IMPLANT
CATH S-M CIRCA TEMP PROBE (CATHETERS) IMPLANT
CATH WEB BI DIR CSDF CRV REPRO (CATHETERS) IMPLANT
CLOSURE PERCLOSE PROSTYLE (VASCULAR PRODUCTS) IMPLANT
COVER SWIFTLINK CONNECTOR (BAG) ×1 IMPLANT
PACK EP LATEX FREE (CUSTOM PROCEDURE TRAY) ×1
PACK EP LF (CUSTOM PROCEDURE TRAY) ×1 IMPLANT
PAD DEFIB RADIO PHYSIO CONN (PAD) ×1 IMPLANT
PATCH CARTO3 (PAD) IMPLANT
SHEATH BAYLIS TRANSSEPTAL 98CM (NEEDLE) IMPLANT
SHEATH CARTO VIZIGO SM CVD (SHEATH) IMPLANT
SHEATH PINNACLE 8F 10CM (SHEATH) IMPLANT
SHEATH PINNACLE 9F 10CM (SHEATH) IMPLANT
SHEATH PINNACLE VASC 9FR (SHEATH) IMPLANT
SHEATH PROBE COVER 6X72 (BAG) IMPLANT
TUBING SMART ABLATE COOLFLOW (TUBING) IMPLANT

## 2022-12-12 NOTE — Anesthesia Procedure Notes (Signed)
Procedure Name: Intubation Date/Time: 12/12/2022 1:09 PM  Performed by: Cheree Ditto, CRNAPre-anesthesia Checklist: Patient identified, Emergency Drugs available, Suction available and Patient being monitored Patient Re-evaluated:Patient Re-evaluated prior to induction Oxygen Delivery Method: Circle system utilized Preoxygenation: Pre-oxygenation with 100% oxygen Induction Type: IV induction Ventilation: Mask ventilation without difficulty Laryngoscope Size: Mac and 3 Grade View: Grade I Tube type: Oral Number of attempts: 1 Airway Equipment and Method: Stylet and Oral airway Placement Confirmation: ETT inserted through vocal cords under direct vision, positive ETCO2 and breath sounds checked- equal and bilateral Secured at: 20 cm Tube secured with: Tape Dental Injury: Teeth and Oropharynx as per pre-operative assessment

## 2022-12-12 NOTE — H&P (Signed)
Electrophysiology Office Follow up Visit Note:     Date:  12/12/2022    ID:  Nichole Berg, DOB 26-Dec-1945, MRN [id number]   PCP:  Madelin Headings, MD             [facility] Cardiologist:  Donato Schultz, MD  [facility] Electrophysiologist:  Lanier Prude, MD      Interval History:     Nichole Berg is a 77 y.o. female who presents for a follow up visit. They were last seen in clinic 05/02/2022 for persistnet AF. Prior ablation 12/10/2020. She saw [facility] 10/27/2022. At that appointment she reported recurrence of atypical atrial flutter.  She was symptomatic with AFL. Options for rhythm control were discussed at the last appointment and she wished to discuss redo catheter ablation.   Today, she confirms that her FitBit started detecting increased heart rates on 2/12. It had been about 7 months since her previous arrhythmic episode. She states that her pulse has not been as fast as it was during her prior episode. We discussed the procedural details of redo catheter ablation at length. She is amenable to proceeding.   Of note, she already has a cardioversion scheduled for 11/18/2022.   She denies any chest pain, shortness of breath, or peripheral edema. No lightheadedness, headaches, syncope, orthopnea, or PND.   Presents for PVI.     Objective  Past medical, surgical, social and family history were reviewed.   Current Medications: Active Medications      Current Meds  Medication Sig   acetaminophen (TYLENOL) 650 MG CR tablet Take 650 mg by mouth as needed for pain.   apixaban (ELIQUIS) 5 MG TABS tablet TAKE 1 TABLET TWICE DAILY   Carboxymethylcellul-Glycerin (LUBRICATING EYE DROPS OP) Place 1 drop into both eyes daily as needed (dry eyes).   Cholecalciferol (VITAMIN D) 2000 UNITS tablet Take 2,000 Units by mouth daily.   Fluorouracil 5 % SOLN Apply 1 Application topically 2 (two) times daily as needed (precancerous spots).   metoprolol succinate (TOPROL-XL) 50 MG 24 hr tablet Take 1  tablet (50 mg total) by mouth daily. Take with or immediately following a meal.   Multiple Vitamins-Minerals (ADULT GUMMY PO) Take 1 capsule by mouth once a week.   Multiple Vitamins-Minerals (LUTEIN-ZEAXANTHIN PO) Take 1 tablet by mouth daily.        Allergies:   Pravastatin, Rosuvastatin, and Sulfonamide derivatives    ROS:   Please see the history of present illness.    All other systems reviewed and are negative.   EKGs/Labs/Other Studies Reviewed:     The following studies were reviewed today:       EKG:  The ekg ordered today demonstrates atypical atrial flutter with a v rate 110 bpm.     Physical Exam:     VS:  BP 116/86   Pulse 120   Ht 5\' 5"  (1.651 m)   Wt 133 lb 9.6 oz (60.6 kg)   SpO2 99%   BMI 22.23 kg/m         Wt Readings from Last 3 Encounters:  11/04/22 133 lb 9.6 oz (60.6 kg)  10/29/22 133 lb (60.3 kg)  10/27/22 134 lb 12.8 oz (61.1 kg)      GEN: Well nourished, well developed in no acute distress CARDIAC: regular, tachycardic, no murmurs, rubs, gallops RESPIRATORY:  Clear to auscultation without rales, wheezing or rhonchi          Assessment ASSESSMENT:     1. Atypical atrial  flutter (HCC)   2. Persistent atrial fibrillation (HCC)   3. Essential hypertension     PLAN:     In order of problems listed above:   #Persistent AF and AFL Symptomatic. Recurrent after 2021 ablation. Continue [place]. Treatment options were discussed with the patient including redo catheter ablation and antiarrhythmic drugs.  She would like to proceed with catheter ablation in an effort to avoid long-term exposure to antiarrhythmic drugs if possible.  I think this is very reasonable.  Will cancel her March cardioversion and plan for early ablation early in April so that I can map this flutter circuit. Ablation strategy will be to map atypical atrial flutter and ablate. Recheck veins and posterior wall. Add CTI.   Discussed treatment options today for their AF/AFL  including antiarrhythmic drug therapy and ablation. Discussed risks, recovery and likelihood of success. Discussed potential need for repeat ablation procedures and antiarrhythmic drugs after an initial ablation. They wish to proceed with scheduling.   Risk, benefits, and alternatives to EP study and radiofrequency ablation for afib were also discussed in detail today. These risks include but are not limited to stroke, bleeding, vascular damage, tamponade, perforation, damage to the esophagus, lungs, and other structures, pulmonary vein stenosis, worsening renal function, and death. The patient understands these risk and wishes to proceed.  We will therefore proceed with catheter ablation at the next available time.  Carto, ICE, anesthesia are requested for the procedure.  Will also obtain CT PV protocol prior to the procedure to exclude LAA thrombus and further evaluate atrial anatomy.      Presents for PVI today. Procedure reviewed.  Signed, Steffanie Dunnameron Bland Rudzinski, MD, [facility], [facility] 12/12/2022 Electrophysiology [facility]

## 2022-12-12 NOTE — Anesthesia Preprocedure Evaluation (Addendum)
Anesthesia Evaluation  Patient identified by MRN, date of birth, ID band Patient awake    Reviewed: Allergy & Precautions, NPO status , Patient's Chart, lab work & pertinent test results, reviewed documented beta blocker date and time   History of Anesthesia Complications Negative for: history of anesthetic complications  Airway Mallampati: III  TM Distance: >3 FB Neck ROM: Full   Comment: Previous grade I view with Miller 2, easy mask Dental  (+) Dental Advisory Given   Pulmonary neg shortness of breath, neg sleep apnea, neg COPD, neg recent URI, former smoker   Pulmonary exam normal breath sounds clear to auscultation       Cardiovascular (-) angina + CAD  (-) Past MI, (-) Cardiac Stents and (-) CABG + dysrhythmias (on Eliquis, metoprolol) Atrial Fibrillation  Rhythm:Regular Rate:Normal  HLD  TTE 08/10/2020: IMPRESSIONS     1. Left ventricular ejection fraction, by estimation, is 60 to 65%. The  left ventricle has normal function. The left ventricle has no regional  wall motion abnormalities. Left ventricular diastolic parameters are  indeterminate.   2. Right ventricular systolic function is normal. The right ventricular  size is normal. There is normal pulmonary artery systolic pressure.   3. Left atrial size was moderately dilated.   4. Right atrial size was mildly dilated.   5. The mitral valve is degenerative. Mild mitral valve regurgitation. No  evidence of mitral stenosis. Moderate mitral annular calcification.   6. Tricuspid valve regurgitation is mild to moderate.   7. The aortic valve is tricuspid. Aortic valve regurgitation is not  visualized. Mild to moderate aortic valve sclerosis/calcification is  present, without any evidence of aortic stenosis.   8. The inferior vena cava is normal in size with greater than 50%  respiratory variability, suggesting right atrial pressure of 3 mmHg.     Neuro/Psych negative  neurological ROS     GI/Hepatic negative GI ROS, Neg liver ROS,,,  Endo/Other  negative endocrine ROS    Renal/GU negative Renal ROS     Musculoskeletal  (+) Arthritis ,    Abdominal   Peds  Hematology negative hematology ROS (+)   Anesthesia Other Findings   Reproductive/Obstetrics                             Anesthesia Physical Anesthesia Plan  ASA: 2  Anesthesia Plan: General   Post-op Pain Management:    Induction: Intravenous  PONV Risk Score and Plan: 3 and Ondansetron, Dexamethasone and Treatment may vary due to age or medical condition  Airway Management Planned: Oral ETT  Additional Equipment:   Intra-op Plan:   Post-operative Plan: Extubation in OR  Informed Consent: I have reviewed the patients History and Physical, chart, labs and discussed the procedure including the risks, benefits and alternatives for the proposed anesthesia with the patient or authorized representative who has indicated his/her understanding and acceptance.     Dental advisory given  Plan Discussed with: CRNA and Anesthesiologist  Anesthesia Plan Comments: (Risks of general anesthesia discussed including, but not limited to, sore throat, hoarse voice, chipped/damaged teeth, injury to vocal cords, nausea and vomiting, allergic reactions, lung infection, heart attack, stroke, and death. All questions answered. )        Anesthesia Quick Evaluation

## 2022-12-12 NOTE — Anesthesia Postprocedure Evaluation (Signed)
Anesthesia Post Note  Patient: Nichole Berg  Procedure(s) Performed: ATRIAL FIBRILLATION ABLATION     Patient location during evaluation: PACU Anesthesia Type: General Level of consciousness: awake Pain management: pain level controlled Vital Signs Assessment: post-procedure vital signs reviewed and stable Respiratory status: spontaneous breathing, nonlabored ventilation and respiratory function stable Cardiovascular status: blood pressure returned to baseline and stable Postop Assessment: no apparent nausea or vomiting Anesthetic complications: no  No notable events documented.  Last Vitals:  Vitals:   12/12/22 1900 12/12/22 1911  BP: (!) 118/100 111/64  Pulse: 67 92  Resp: (!) 22 (!) 41  Temp:    SpO2: 96% 97%    Last Pain:  Vitals:   12/12/22 1911  TempSrc:   PainSc: 0-No pain                 Linton Rump

## 2022-12-12 NOTE — Transfer of Care (Signed)
Immediate Anesthesia Transfer of Care Note  Patient: Nichole Berg  Procedure(s) Performed: ATRIAL FIBRILLATION ABLATION  Patient Location: Cath Lab  Anesthesia Type:General  Level of Consciousness: awake, alert , and oriented  Airway & Oxygen Therapy: Patient Spontanous Breathing and Patient connected to nasal cannula oxygen  Post-op Assessment: Report given to RN and Post -op Vital signs reviewed and stable  Post vital signs: Reviewed and stable  Last Vitals:  Vitals Value Taken Time  BP 117/66 12/12/22 1500  Temp 36.8 C 12/12/22 1457  Pulse 67 12/12/22 1501  Resp 17 12/12/22 1501  SpO2 98 % 12/12/22 1501  Vitals shown include unvalidated device data.  Last Pain:  Vitals:   12/12/22 1457  TempSrc: Temporal  PainSc: 0-No pain         Complications: No notable events documented.

## 2022-12-12 NOTE — Discharge Instructions (Addendum)
Post procedure care instructions No driving for 4 days. No lifting over 5 lbs for 1 week. No vigorous or sexual activity for 1 week. You may return to work/your usual activities on 12/20/22. Keep procedure site clean & dry. If you notice increased pain, swelling, bleeding or pus, call/return!  You may shower after 24 hours, but no soaking in baths/hot tubs/pools for 1 week.    You have an appointment set up with the Atrial Fibrillation Clinic.  Multiple studies have shown that being followed by a dedicated atrial fibrillation clinic in addition to the standard care you receive from your other physicians improves health. We believe that enrollment in the atrial fibrillation clinic will allow us to better care for you.   The phone number to the Atrial Fibrillation Clinic is [phone]. The clinic is staffed Monday through Friday from 8:30am to 5pm.  Directions: The clinic is located in the [facility], 6TH FLOOR Enter the hospital at the MAIN ENTRANCE "A", use North Tower Elevators to the 6th floor.  Registration desk to the right of elevators on 6th floor  If you have any trouble locating the clinic, please don't hesitate to call [phone].    Cardiac Ablation, Care After  This sheet gives you information about how to care for yourself after your procedure. Your health care provider may also give you more specific instructions. If you have problems or questions, contact your health care provider. What can I expect after the procedure? After the procedure, it is common to have: Bruising around your puncture site. Tenderness around your puncture site. Skipped heartbeats. If you had an atrial fibrillation ablation, you may have atrial fibrillation during the first several months after your procedure.  Tiredness (fatigue).  Follow these instructions at home: Puncture site care  Follow instructions from your health care provider about how to take care of your puncture site. Make sure  you: If present, leave stitches (sutures), skin glue, or adhesive strips in place. These skin closures may need to stay in place for up to 2 weeks. If adhesive strip edges start to loosen and curl up, you may trim the loose edges. Do not remove adhesive strips completely unless your health care provider tells you to do that. If a large square bandage is present, this may be removed 24 hours after surgery.  Check your puncture site every day for signs of infection. Check for: Redness, swelling, or pain. Fluid or blood. If your puncture site starts to bleed, lie down on your back, apply firm pressure to the area, and contact your health care provider. Warmth. Pus or a bad smell. A pea or small marble sized lump at the site is normal and can take up to three months to resolve.  Driving Do not drive for at least 4 days after your procedure or however long your health care provider recommends. (Do not resume driving if you have previously been instructed not to drive for other health reasons.) Do not drive or use heavy machinery while taking prescription pain medicine. Activity Avoid activities that take a lot of effort for at least 7 days after your procedure. Do not lift anything that is heavier than 5 lb (4.5 kg) for one week.  No sexual activity for 1 week.  Return to your normal activities as told by your health care provider. Ask your health care provider what activities are safe for you. General instructions Take over-the-counter and prescription medicines only as told by your health care provider. Do   products that contain nicotine or tobacco, such as cigarettes and e-cigarettes. If you need help quitting, ask your health care provider. You may shower after 24 hours, but Do not take baths, swim, or use a hot tub for 1 week.  Do not drink alcohol for 24 hours after your procedure. Keep all follow-up visits as told by your health care provider. This is important. Contact a health  care provider if: You have redness, mild swelling, or pain around your puncture site. You have fluid or blood coming from your puncture site that stops after applying firm pressure to the area. Your puncture site feels warm to the touch. You have pus or a bad smell coming from your puncture site. You have a fever. You have chest pain or discomfort that spreads to your neck, jaw, or arm. You have chest pain that is worse with lying on your back or taking a deep breath. You are sweating a lot. You feel nauseous. You have a fast or irregular heartbeat. You have shortness of breath. You are dizzy or light-headed and feel the need to lie down. You have pain or numbness in the arm or leg closest to your puncture site. Get help right away if: Your puncture site suddenly swells. Your puncture site is bleeding and the bleeding does not stop after applying firm pressure to the area. These symptoms may represent a serious problem that is an emergency. Do not wait to see if the symptoms will go away. Get medical help right away. Call your local emergency services (911 in the [place] Do not drive yourself to the hospital. Summary After the procedure, it is normal to have bruising and tenderness at the puncture site in your groin, neck, or forearm. Check your puncture site every day for signs of infection. Get help right away if your puncture site is bleeding and the bleeding does not stop after applying firm pressure to the area. This is a medical emergency. This information is not intended to replace advice given to you by your health care provider. Make sure you discuss any questions you have with your health care provider.

## 2022-12-15 ENCOUNTER — Encounter (HOSPITAL_COMMUNITY): Payer: Self-pay | Admitting: Cardiology

## 2022-12-21 ENCOUNTER — Encounter: Payer: Self-pay | Admitting: Cardiology

## 2022-12-22 DIAGNOSIS — M9901 Segmental and somatic dysfunction of cervical region: Secondary | ICD-10-CM | POA: Diagnosis not present

## 2022-12-22 DIAGNOSIS — M5411 Radiculopathy, occipito-atlanto-axial region: Secondary | ICD-10-CM | POA: Diagnosis not present

## 2023-01-09 ENCOUNTER — Ambulatory Visit (HOSPITAL_COMMUNITY)
Admission: RE | Admit: 2023-01-09 | Discharge: 2023-01-09 | Disposition: A | Discharge status: Home / Self Care | Payer: Medicare PPO | Source: Ambulatory Visit | Attending: Physician Assistant | Admitting: Physician Assistant

## 2023-01-09 VITALS — BP 136/78 | HR 62 | Ht 65.0 in | Wt 135.4 lb

## 2023-01-09 DIAGNOSIS — I4819 Other persistent atrial fibrillation: Secondary | ICD-10-CM | POA: Diagnosis not present

## 2023-01-09 DIAGNOSIS — I2510 Atherosclerotic heart disease of native coronary artery without angina pectoris: Secondary | ICD-10-CM | POA: Diagnosis not present

## 2023-01-09 DIAGNOSIS — Z7901 Long term (current) use of anticoagulants: Secondary | ICD-10-CM | POA: Diagnosis not present

## 2023-01-09 DIAGNOSIS — D6869 Other thrombophilia: Secondary | ICD-10-CM | POA: Diagnosis not present

## 2023-01-09 DIAGNOSIS — I10 Essential (primary) hypertension: Secondary | ICD-10-CM | POA: Diagnosis not present

## 2023-01-09 DIAGNOSIS — I484 Atypical atrial flutter: Secondary | ICD-10-CM

## 2023-01-09 NOTE — Progress Notes (Signed)
Primary Care Physician: Madelin Headings, MD Primary Cardiologist: Dr Anne Fu Primary Electrophysiologist: Dr Lalla Brothers Referring Physician: Dr Rinaldo Cloud is a 77 y.o. female with a history of HTN, HLD, CAD, and atrial fibrillation who presents for follow up in the [facility]. The patient was initially diagnosed with atrial fibrillation 06/25/20 after presenting to urgent care with symptoms of dizziness and palpitations. Patient is on Eliquis for a CHADS2VASC score of 5. She underwent DCCV on 08/15/20. Unfortunately, she felt she was back in afib with palpitations about 3 weeks later.   Patient is s/p afib ablation with Dr Lalla Brothers on 12/10/20.   On 03/16/22 she noted an elevated heart rate with tachypalpitations. She was in atrial flutter and underwent DCCV on 04/10/22. She was seen by Dr Lalla Brothers on 05/02/22 and watchful waiting was pursued at that time. She had increased burden of atrial flutter and is s/p repeat ablation 12/12/22.  On follow up today, patient reports that she has done well since the ablation. She remains in SR. No chest pain, swallowing pain, or groin issues.   Today, she denies symptoms of palpitations, chest pain, shortness of breath, orthopnea, PND, lower extremity edema, dizziness, presyncope, syncope, snoring, daytime somnolence, bleeding, or neurologic sequela. The patient is tolerating medications without difficulties and is otherwise without complaint today.    Atrial Fibrillation Risk Factors:  she does not have symptoms or diagnosis of sleep apnea. she does not have a history of rheumatic fever. she does not have a history of alcohol use. The patient does have a history of early familial atrial fibrillation or other arrhythmias. Son has afib.  she has a BMI of Body mass index is 22.53 kg/m.Marland Kitchen Filed Weights   01/09/23 0935  Weight: 61.4 kg   Family History  Problem Relation Age of Onset   Atrial fibrillation Mother        Has  pacer doing very well age 11 passed suddnely age 34 poss CV    Atrial Fibrillation Management history:  Previous antiarrhythmic drugs: none Previous cardioversions: 08/15/20, 04/10/22 Previous ablations: 12/10/20, 11/04/22 CHADS2VASC score: 5 Anticoagulation history: Eliquis   Past Medical History:  Diagnosis Date   Cataract 10/18/2011   Followed by opthalmology.    COLONIC POLYPS, HX OF 10/12/2009   COVID-19 03/2020   HYPERLIPIDEMIA 05/31/2007   NEPHROLITHIASIS, HX OF 05/31/2007   Past Surgical History:  Procedure Laterality Date   ATRIAL FIBRILLATION ABLATION N/A 12/10/2020   Procedure: ATRIAL FIBRILLATION ABLATION;  Surgeon: Lanier Prude, MD;  Location: [facility] INVASIVE CV LAB;  Service: Cardiovascular;  Laterality: N/A;   ATRIAL FIBRILLATION ABLATION N/A 12/12/2022   Procedure: ATRIAL FIBRILLATION ABLATION;  Surgeon: Lanier Prude, MD;  Location: [facility] INVASIVE CV LAB;  Service: Cardiovascular;  Laterality: N/A;   CARDIOVERSION N/A 08/15/2020   Procedure: CARDIOVERSION;  Surgeon: Meriam Sprague, MD;  [facility] ENDOSCOPY;  Service: Cardiovascular;  Laterality: N/A;   CARDIOVERSION N/A 04/10/2022   Procedure: CARDIOVERSION;  Surgeon: Christell Constant, MD;  Location: [facility] ENDOSCOPY;  Service: Cardiovascular;  Laterality: N/A;   CATARACT EXTRACTION, BILATERAL      Current Outpatient Medications  Medication Sig Dispense Refill   acetaminophen (TYLENOL) 500 MG tablet Take 500 mg by mouth every 6 (six) hours as needed for moderate pain.     apixaban (ELIQUIS) 5 MG TABS tablet TAKE 1 TABLET TWICE DAILY 180 tablet 1   Carboxymethylcellul-Glycerin (LUBRICATING EYE DROPS OP) Place 1 drop into both eyes daily  as needed (dry eyes).     Cholecalciferol (VITAMIN D) 2000 UNITS tablet Take 2,000 Units by mouth daily.     Fluorouracil 5 % SOLN Apply 1 Application topically 2 (two) times daily as needed (precancerous spots).     melatonin 5 MG TABS Take 5 mg by mouth at bedtime as needed  (sleep).     metoprolol succinate (TOPROL-XL) 50 MG 24 hr tablet Take 1 tablet (50 mg total) by mouth daily. Take with or immediately following a meal. 90 tablet 3   Multiple Vitamins-Minerals (ADULT GUMMY PO) Take 1 capsule by mouth 2 (two) times a week.     Multiple Vitamins-Minerals (LUTEIN-ZEAXANTHIN PO) Take 1 tablet by mouth daily.     omeprazole (PRILOSEC) 20 MG capsule Take 20 mg by mouth daily.     No current facility-administered medications for this encounter.    Allergies  Allergen Reactions   Iodinated Contrast Media Hives and Itching   Pravastatin     Headache on 40mg  daily dosing. Tolerates 10 and 20mg    Rosuvastatin     rosuvastatin 5mg  3x weekly and daily - headaches and myalgias   Sulfonamide Derivatives Itching and Rash    Social History   Socioeconomic History   Marital status: Single    Spouse name: Not on file   Number of children: Not on file   Years of education: Not on file   Highest education level: Not on file  Occupational History   Occupation: Retired  Tobacco Use   Smoking status: Former    Years: 2    Types: Cigarettes    Quit date: 09/08/1964    Years since quitting: 58.3   Smokeless tobacco: Never   Tobacco comments:    Former smoker 03/18/22  Vaping Use   Vaping Use: Never used  Substance and Sexual Activity   Alcohol use: Yes    Comment: occassional   Drug use: No   Sexual activity: Not on file  Other Topics Concern   Not on file  Social History Narrative   Retired Publishing rights manager    Non smoker   Works as Engineer, 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    [employer] 2 1 cat neg tob      Social Determinants of Health   Financial Resource Strain: Low Risk  (10/29/2022)   Overall Financial Resource Strain (CARDIA)    Difficulty of Paying Living Expenses: Not hard at all  Food Insecurity: No Food Insecurity (10/29/2022)   Hunger Vital Sign    Worried About Running Out of Food in the Last Year: Never true    Ran Out of Food in the Last Year:  Never true  Transportation Needs: No Transportation Needs (10/29/2022)   PRAPARE - Administrator, Civil Service (Medical): No    Lack of Transportation (Non-Medical): No  Physical Activity: Sufficiently Active (10/29/2022)   Exercise Vital Sign    Days of Exercise per Week: 7 days    Minutes of Exercise per Session: 60 min  Stress: No Stress Concern Present (10/29/2022)   [employer] of Occupational Health - Occupational Stress Questionnaire    Feeling of Stress : Not at all  Social Connections: Moderately Isolated (10/29/2022)   Social Connection and Isolation Panel [NHANES]    Frequency of Communication with Friends and Family: More than three times a week    Frequency of Social Gatherings with Friends and Family: More than three times a week    Attends Religious Services: Never  Active Member of Clubs or Organizations: No    Attends Banker Meetings: Never    Marital Status: Living with partner  Intimate Partner Violence: Not At Risk (10/29/2022)   Humiliation, Afraid, Rape, and Kick questionnaire    Fear of Current or Ex-Partner: No    Emotionally Abused: No    Physically Abused: No    Sexually Abused: No     ROS- All systems are reviewed and negative except as per the HPI above.  Physical Exam: Vitals:   01/09/23 0935  BP: 136/78  Pulse: 62  Weight: 61.4 kg  Height: 5\' 5"  (1.651 m)    GEN- The patient is a well appearing elderly female, alert and oriented x 3 today.   HEENT-head normocephalic, atraumatic, sclera clear, conjunctiva pink, hearing intact, trachea midline. Lungs- Clear to ausculation bilaterally, normal work of breathing Heart- Regular rate and rhythm, no murmurs, rubs or gallops  GI- soft, NT, ND, + BS Extremities- no clubbing, cyanosis, or edema MS- no significant deformity or atrophy Skin- no rash or lesion Psych- euthymic mood, full affect Neuro- strength and sensation are intact   Wt Readings from Last 3  Encounters:  01/09/23 61.4 kg  12/12/22 59.4 kg  11/04/22 60.6 kg    EKG today demonstrates  SR, 1st degree AV block Vent. rate 62 BPM PR interval 216 ms QRS duration 80 ms QT/QTcB 450/456 ms  Echo 08/10/20 demonstrated  1. Left ventricular ejection fraction, by estimation, is 60 to 65%. The  left ventricle has normal function. The left ventricle has no regional  wall motion abnormalities. Left ventricular diastolic parameters are  indeterminate.   2. Right ventricular systolic function is normal. The right ventricular  size is normal. There is normal pulmonary artery systolic pressure.   3. Left atrial size was moderately dilated.   4. Right atrial size was mildly dilated.   5. The mitral valve is degenerative. Mild mitral valve regurgitation. No  evidence of mitral stenosis. Moderate mitral annular calcification.   6. Tricuspid valve regurgitation is mild to moderate.   7. The aortic valve is tricuspid. Aortic valve regurgitation is not  visualized. Mild to moderate aortic valve sclerosis/calcification is  present, without any evidence of aortic stenosis.   8. The inferior vena cava is normal in size with greater than 50%  respiratory variability, suggesting right atrial pressure of 3 mmHg.  Epic records are reviewed at length today  CHA2DS2-VASc Score = 5  The patient's score is based upon: CHF History: 0 HTN History: 1 Diabetes History: 0 Stroke History: 0 Vascular Disease History: 1 Age Score: 2 Gender Score: 1      ASSESSMENT AND PLAN: 1. Persistent Atrial Fibrillation/atrial flutter The patient's CHA2DS2-VASc score is 5, indicating a 7.2% annual risk of stroke. S/p afib ablation 12/10/20 and 12/12/22 Patient appears to be maintaining SR.  Continue Eliquis 5 mg BID with no missed doses for 3 months post ablation. Continue Toprol 50 mg daily   2. Secondary Hypercoagulable State (ICD10:  D68.69) The patient is at significant risk for stroke/thromboembolism based  upon her CHA2DS2-VASc Score of 5.  Continue Apixaban (Eliquis).   3. HTN Stable, no changes today.  4. CAD CAC score 366 Intolerant of statins, seen in lipid clinic. Followed by Dr Anne Fu No anginal symptoms.   Follow up with Dr Lalla Brothers as scheduled.    Jorja Loa PA-C Afib Clinic [facility] [address] [place], [place] [address] [phone] 01/09/2023 9:57 AM

## 2023-01-26 DIAGNOSIS — L570 Actinic keratosis: Secondary | ICD-10-CM | POA: Diagnosis not present

## 2023-01-26 DIAGNOSIS — L814 Other melanin hyperpigmentation: Secondary | ICD-10-CM | POA: Diagnosis not present

## 2023-01-26 DIAGNOSIS — D229 Melanocytic nevi, unspecified: Secondary | ICD-10-CM | POA: Diagnosis not present

## 2023-01-26 DIAGNOSIS — L821 Other seborrheic keratosis: Secondary | ICD-10-CM | POA: Diagnosis not present

## 2023-01-27 DIAGNOSIS — M9901 Segmental and somatic dysfunction of cervical region: Secondary | ICD-10-CM | POA: Diagnosis not present

## 2023-01-27 DIAGNOSIS — M5411 Radiculopathy, occipito-atlanto-axial region: Secondary | ICD-10-CM | POA: Diagnosis not present

## 2023-02-24 DIAGNOSIS — M5411 Radiculopathy, occipito-atlanto-axial region: Secondary | ICD-10-CM | POA: Diagnosis not present

## 2023-02-24 DIAGNOSIS — M9901 Segmental and somatic dysfunction of cervical region: Secondary | ICD-10-CM | POA: Diagnosis not present

## 2023-02-26 ENCOUNTER — Other Ambulatory Visit: Payer: Self-pay | Admitting: Cardiology

## 2023-02-26 DIAGNOSIS — I4819 Other persistent atrial fibrillation: Secondary | ICD-10-CM

## 2023-02-26 NOTE — Telephone Encounter (Signed)
Prescription refill request for Eliquis received.  Indication: afib  Last office visit: lambert, 11/04/2022 Scr:  0.71, 11/24/2022 Age: 77 yo  Weight: 61.4 kg   Refill sent.

## 2023-03-10 ENCOUNTER — Ambulatory Visit: Payer: Medicare PPO | Attending: Cardiology | Admitting: Cardiology

## 2023-03-10 ENCOUNTER — Encounter: Payer: Self-pay | Admitting: Cardiology

## 2023-03-10 VITALS — BP 132/74 | HR 62 | Ht 65.0 in | Wt 134.2 lb

## 2023-03-10 DIAGNOSIS — I2510 Atherosclerotic heart disease of native coronary artery without angina pectoris: Secondary | ICD-10-CM

## 2023-03-10 DIAGNOSIS — D6869 Other thrombophilia: Secondary | ICD-10-CM

## 2023-03-10 DIAGNOSIS — I2584 Coronary atherosclerosis due to calcified coronary lesion: Secondary | ICD-10-CM

## 2023-03-10 DIAGNOSIS — I4819 Other persistent atrial fibrillation: Secondary | ICD-10-CM

## 2023-03-10 DIAGNOSIS — Z789 Other specified health status: Secondary | ICD-10-CM | POA: Diagnosis not present

## 2023-03-10 DIAGNOSIS — I484 Atypical atrial flutter: Secondary | ICD-10-CM

## 2023-03-10 NOTE — Progress Notes (Unsigned)
  Electrophysiology Office Follow up Visit Note:    Date:  03/11/2023   ID:  Nichole Berg, DOB 27-Jun-1946, MRN [id number]  PCP:  Madelin Headings, MD  [facility] Cardiologist:  Donato Schultz, MD  [facility] Electrophysiologist:  Lanier Prude, MD    Interval History:    Nichole Berg is a 77 y.o. female who presents for a follow up visit.   She had a redo A-fib ablation on December 12, 2022.  During that procedure the right superior pulmonary vein was reisolated.  The posterior wall was also ablated.  A micro reentrant atrial flutter anterior to the right superior pulmonary vein was also targeted with ablation.  She saw Clide Cliff on Jan 09, 2023.  At that appointment she reported maintenance of sinus rhythm.  She saw Dr. Anne Fu on March 10, 2023.  At that appointment she was also doing well without recurrence of arrhythmia.  She recently went on a trip to [place] and hiked in [place], [place].  She is doing well.  No recurrence of arrhythmia.  Continues to take Eliquis and metoprolol.       Past medical, surgical, social and family history were reviewed.  ROS:   Please see the history of present illness.    All other systems reviewed and are negative.  EKGs/Labs/Other Studies Reviewed:    The following studies were reviewed today:    EKG Interpretation Date/Time:  Wednesday March 11 2023 11:12:02 EDT Ventricular Rate:  58 PR Interval:  208 QRS Duration:  78 QT Interval:  458 QTC Calculation: 449 R Axis:   30  Text Interpretation: Sinus bradycardia with Premature supraventricular complexes Confirmed by Steffanie Dunn [id number]) on 03/11/2023 11:37:37 AM    Physical Exam:    VS:  BP 132/80   Pulse (!) 58   Ht 5' 4.5" (1.638 m)   Wt 134 lb (60.8 kg)   BMI 22.65 kg/m     Wt Readings from Last 3 Encounters:  03/11/23 134 lb (60.8 kg)  03/10/23 134 lb 3.2 oz (60.9 kg)  01/09/23 135 lb 6.4 oz (61.4 kg)     GEN:  Well nourished, well developed in no acute  distress CARDIAC: RRR, no murmurs, rubs, gallops RESPIRATORY:  Clear to auscultation without rales, wheezing or rhonchi       ASSESSMENT:    1. Persistent atrial fibrillation (HCC)   2. Essential hypertension    PLAN:    In order of problems listed above:  #Persistent atrial fibrillation and flutter Doing well after her second catheter ablation in April 2024.  Maintaining sinus rhythm. Continue metoprolol and Eliquis  #Hypertension At goal today.  Recommend checking blood pressures 1-2 times per week at home and recording the values.  Recommend bringing these recordings to the primary care physician.   Follow-up 1 year with me.       Signed, Steffanie Dunn, MD, [facility], [facility] 03/11/2023 11:48 AM    Electrophysiology [facility]

## 2023-03-10 NOTE — Patient Instructions (Signed)
Medication Instructions:  The current medical regimen is effective;  continue present plan and medications.  *If you need a refill on your cardiac medications before your next appointment, please call your pharmacy*  Follow-Up: At [facility], you and your health needs are our priority.  As part of our continuing mission to provide you with exceptional heart care, we have created designated Provider Care Teams.  These Care Teams include your primary Cardiologist (physician) and Advanced Practice Providers (APPs -  Physician Assistants and Nurse Practitioners) who all work together to provide you with the care you need, when you need it.  We recommend signing up for the patient portal called "MyChart".  Sign up information is provided on this After Visit Summary.  MyChart is used to connect with patients for Virtual Visits (Telemedicine).  Patients are able to view lab/test results, encounter notes, upcoming appointments, etc.  Non-urgent messages can be sent to your provider as well.   To learn more about what you can do with MyChart, go to [email or web address].    Your next appointment:   1 year(s)  Provider:   Mark Skains, MD      

## 2023-03-10 NOTE — Progress Notes (Signed)
  Cardiology Office Note:  .   Date:  03/10/2023  ID:  Nichole Berg, DOB 1945-11-20, MRN [id number] PCP: Madelin Headings, MD  [facility] Providers Cardiologist:  Donato Schultz, MD Electrophysiologist:  Lanier Prude, MD    History of Present Illness: .   Nichole Berg is a 77 y.o. female here for the follow-up of paroxysmal atrial fibrillation.  Seen by Dr. Lalla Brothers as well as atrial fibrillation clinic.  Cardioversion 08/15/2020.  ERAF 3 weeks later.  Atrial fibrillation ablation Dr. Lalla Brothers 4//22.  DC cardioversion 04/10/2022. Repeat ablation/5/24 after increasing atrial flutter.  [employer] [place] hike shortly after ablation. Did well.  Feeling great.  ROS: No syncope no bleeding no chest pain  Studies Reviewed: .        Echo 2021-EF 65% moderately dilated left atrium mild MR mild to moderate aortic valve sclerosis without stenosis. Risk Assessment/Calculations:    CHA2DS2-VASc Score = 5   This indicates a 7.2% annual risk of stroke. The patient's score is based upon: CHF History: 0 HTN History: 1 Diabetes History: 0 Stroke History: 0 Vascular Disease History: 1 Age Score: 2 Gender Score: 1            Physical Exam:   VS:  BP 132/74   Pulse 62   Ht 5\' 5"  (1.651 m)   Wt 134 lb 3.2 oz (60.9 kg)   SpO2 98%   BMI 22.33 kg/m    Wt Readings from Last 3 Encounters:  03/10/23 134 lb 3.2 oz (60.9 kg)  01/09/23 135 lb 6.4 oz (61.4 kg)  12/12/22 131 lb (59.4 kg)    GEN: Well nourished, well developed in no acute distress NECK: No JVD; No carotid bruits CARDIAC: RRR, no murmurs, rubs, gallops RESPIRATORY:  Clear to auscultation without rales, wheezing or rhonchi  ABDOMEN: Soft, non-tender, non-distended EXTREMITIES:  No edema; No deformity   ASSESSMENT AND PLAN: .    Persistent atrial fibrillation/atrial flutter - Status post ablation x 2 Dr. Lalla Brothers.  Currently doing well maintaining sinus rhythm. Metop 50 XL.  Continue current medication management.  She is  seeing Dr. Lalla Brothers tomorrow.  Hypercoagulable state secondary - Eliquis.  CHA2DS2-VASc 5.  Continue, no bleeding, hemoglobin 12.2 creatinine 0.7  Primary hypertension - Stable without any changes. 110-120 at home. Standing up wave of lightheaded, goes away. Leg cramps - mustard. Try mag.   Coronary artery disease - Coronary calcium score 366.  Did not tolerate statins.  Saw the lipid clinic.  No current anginal symptoms.  Continue diet, exercise.      Dispo: 1 year  Signed, Donato Schultz, MD

## 2023-03-11 ENCOUNTER — Encounter: Payer: Self-pay | Admitting: Cardiology

## 2023-03-11 ENCOUNTER — Ambulatory Visit: Payer: Medicare PPO | Attending: Cardiology | Admitting: Cardiology

## 2023-03-11 VITALS — BP 132/80 | HR 58 | Ht 64.5 in | Wt 134.0 lb

## 2023-03-11 DIAGNOSIS — I10 Essential (primary) hypertension: Secondary | ICD-10-CM

## 2023-03-11 DIAGNOSIS — I4819 Other persistent atrial fibrillation: Secondary | ICD-10-CM

## 2023-03-11 NOTE — Patient Instructions (Signed)
Medication Instructions:  Your physician recommends that you continue on your current medications as directed. Please refer to the Current Medication list given to you today.  *If you need a refill on your cardiac medications before your next appointment, please call your pharmacy*  Follow-Up: At [facility], you and your health needs are our priority.  As part of our continuing mission to provide you with exceptional heart care, we have created designated Provider Care Teams.  These Care Teams include your primary Cardiologist (physician) and Advanced Practice Providers (APPs -  Physician Assistants and Nurse Practitioners) who all work together to provide you with the care you need, when you need it.  Your next appointment:   1 year(s)  Provider:   Cameron Lambert, MD    

## 2023-03-18 ENCOUNTER — Ambulatory Visit: Payer: Medicare PPO | Admitting: Cardiology

## 2023-05-25 ENCOUNTER — Other Ambulatory Visit: Payer: Self-pay | Admitting: Cardiology

## 2023-06-08 DIAGNOSIS — M5411 Radiculopathy, occipito-atlanto-axial region: Secondary | ICD-10-CM | POA: Diagnosis not present

## 2023-06-08 DIAGNOSIS — M9901 Segmental and somatic dysfunction of cervical region: Secondary | ICD-10-CM | POA: Diagnosis not present

## 2023-07-06 DIAGNOSIS — M9901 Segmental and somatic dysfunction of cervical region: Secondary | ICD-10-CM | POA: Diagnosis not present

## 2023-07-06 DIAGNOSIS — M5411 Radiculopathy, occipito-atlanto-axial region: Secondary | ICD-10-CM | POA: Diagnosis not present

## 2023-07-30 DIAGNOSIS — M5411 Radiculopathy, occipito-atlanto-axial region: Secondary | ICD-10-CM | POA: Diagnosis not present

## 2023-07-30 DIAGNOSIS — M9901 Segmental and somatic dysfunction of cervical region: Secondary | ICD-10-CM | POA: Diagnosis not present

## 2023-08-25 ENCOUNTER — Ambulatory Visit (HOSPITAL_COMMUNITY)
Admission: RE | Admit: 2023-08-25 | Discharge: 2023-08-25 | Disposition: A | Discharge status: Home / Self Care | Payer: Medicare PPO | Source: Ambulatory Visit | Attending: Physician Assistant | Admitting: Physician Assistant

## 2023-08-25 ENCOUNTER — Encounter (HOSPITAL_COMMUNITY): Payer: Self-pay | Admitting: Physician Assistant

## 2023-08-25 VITALS — BP 110/90 | HR 119 | Ht 64.5 in | Wt 138.4 lb

## 2023-08-25 DIAGNOSIS — Z7901 Long term (current) use of anticoagulants: Secondary | ICD-10-CM | POA: Insufficient documentation

## 2023-08-25 DIAGNOSIS — I081 Rheumatic disorders of both mitral and tricuspid valves: Secondary | ICD-10-CM | POA: Diagnosis not present

## 2023-08-25 DIAGNOSIS — E785 Hyperlipidemia, unspecified: Secondary | ICD-10-CM | POA: Diagnosis not present

## 2023-08-25 DIAGNOSIS — I484 Atypical atrial flutter: Secondary | ICD-10-CM | POA: Diagnosis not present

## 2023-08-25 DIAGNOSIS — D6869 Other thrombophilia: Secondary | ICD-10-CM | POA: Insufficient documentation

## 2023-08-25 DIAGNOSIS — I10 Essential (primary) hypertension: Secondary | ICD-10-CM | POA: Diagnosis not present

## 2023-08-25 DIAGNOSIS — I4819 Other persistent atrial fibrillation: Secondary | ICD-10-CM | POA: Insufficient documentation

## 2023-08-25 LAB — BASIC METABOLIC PANEL
Anion gap: 8 (ref 5–15)
BUN: 11 mg/dL (ref 8–23)
CO2: 24 mmol/L (ref 22–32)
Calcium: 9.5 mg/dL (ref 8.9–10.3)
Chloride: 105 mmol/L (ref 98–111)
Creatinine, Ser: 0.62 mg/dL (ref 0.44–1.00)
GFR, Estimated: >60 mL/min (ref >60)
Glucose, Bld: 104 mg/dL — ABNORMAL HIGH (ref 70–99)
Potassium: 4.3 mmol/L (ref 3.5–5.1)
Sodium: 137 mmol/L (ref 135–145)

## 2023-08-25 LAB — CBC
HCT: 38.2 % (ref 36.0–46.0)
Hemoglobin: 12.4 g/dL (ref 12.0–15.0)
MCH: 30.8 pg (ref 26.0–34.0)
MCHC: 32.5 g/dL (ref 30.0–36.0)
MCV: 95 fL (ref 80.0–100.0)
Platelets: 248 10*3/uL (ref 150–400)
RBC: 4.02 MIL/uL (ref 3.87–5.11)
RDW: 12.3 % (ref 11.5–15.5)
WBC: 7.3 10*3/uL (ref 4.0–10.5)
nRBC: 0 % (ref 0.0–0.2)

## 2023-08-25 NOTE — Progress Notes (Signed)
Primary Care Physician: Madelin Headings, MD Primary Cardiologist: Dr Anne Fu Primary Electrophysiologist: Dr Lalla Brothers Referring Physician: Dr Rinaldo Cloud is a 77 y.o. female with a history of HTN, HLD, CAD, and atrial fibrillation who presents for follow up in the [facility]. The patient was initially diagnosed with atrial fibrillation 06/25/20 after presenting to urgent care with symptoms of dizziness and palpitations. Patient is on Eliquis for a CHADS2VASC score of 5. She underwent DCCV on 08/15/20. Unfortunately, she felt she was back in afib with palpitations about 3 weeks later. Patient is s/p afib ablation with Dr Lalla Brothers on 12/10/20.   On 03/16/22 she noted an elevated heart rate with tachypalpitations. She was in atrial flutter and underwent DCCV on 04/10/22. She was seen by Dr Lalla Brothers on 05/02/22 and watchful waiting was pursued at that time. She had increased burden of atrial flutter and is s/p repeat ablation 12/12/22.  On follow up today, patient reports that last evening she had pain on the lateral aspect of her ankle, unknown mechanism of injury. She noticed her heart rates have been elevated since that time. Her ankle pain resolved. No bleeding issues on anticoagulation.   Today, she denies symptoms of chest pain, shortness of breath, orthopnea, PND, lower extremity edema, dizziness, presyncope, syncope, snoring, daytime somnolence, bleeding, or neurologic sequela. The patient is tolerating medications without difficulties and is otherwise without complaint today.    Atrial Fibrillation Risk Factors:  she does not have symptoms or diagnosis of sleep apnea. she does not have a history of rheumatic fever. she does not have a history of alcohol use. The patient does have a history of early familial atrial fibrillation or other arrhythmias. Son has afib.   Atrial Fibrillation Management history:  Previous antiarrhythmic drugs: none Previous  cardioversions: 08/15/20, 04/10/22 Previous ablations: 12/10/20, 11/04/22 Anticoagulation history: Eliquis   Past Medical History:  Diagnosis Date   Cataract 10/18/2011   Followed by opthalmology.    COLONIC POLYPS, HX OF 10/12/2009   COVID-19 03/2020   HYPERLIPIDEMIA 05/31/2007   NEPHROLITHIASIS, HX OF 05/31/2007    Current Outpatient Medications  Medication Sig Dispense Refill   acetaminophen (TYLENOL) 500 MG tablet Take 500 mg by mouth every 6 (six) hours as needed for moderate pain.     apixaban (ELIQUIS) 5 MG TABS tablet TAKE 1 TABLET TWICE DAILY 180 tablet 1   Carboxymethylcellul-Glycerin (LUBRICATING EYE DROPS OP) Place 1 drop into both eyes daily as needed (dry eyes).     Cholecalciferol (VITAMIN D) 2000 UNITS tablet Take 2,000 Units by mouth daily.     melatonin 5 MG TABS Take 5 mg by mouth at bedtime as needed (sleep).     metoprolol succinate (TOPROL-XL) 50 MG 24 hr tablet TAKE 1 TABLET DAILY WITH OR IMMEDIATELY FOLLOWING A MEAL. 90 tablet 3   Multiple Vitamins-Minerals (ADULT GUMMY PO) Take 1 capsule by mouth 2 (two) times a week.     Multiple Vitamins-Minerals (LUTEIN-ZEAXANTHIN PO) Take 1 tablet by mouth daily.     No current facility-administered medications for this encounter.    ROS- All systems are reviewed and negative except as per the HPI above.  Physical Exam: Vitals:   08/25/23 0956  BP: (!) 110/90  Pulse: (!) 119  Weight: 62.8 kg  Height: 5' 4.5" (1.638 m)     GEN: Well nourished, well developed in no acute distress NECK: No JVD; No carotid bruits CARDIAC: Regular rate and rhythm, tachycardia, no murmurs, rubs,  gallops RESPIRATORY:  Clear to auscultation without rales, wheezing or rhonchi  ABDOMEN: Soft, non-tender, non-distended EXTREMITIES:  No edema; No deformity    Wt Readings from Last 3 Encounters:  08/25/23 62.8 kg  03/11/23 60.8 kg  03/10/23 60.9 kg    EKG today demonstrates  Atypical atrial flutter with 2:1 block Vent. rate 119 BPM PR  interval 200 ms QRS duration 78 ms QT/QTcB 342/481 ms   Echo 08/10/20 demonstrated  1. Left ventricular ejection fraction, by estimation, is 60 to 65%. The  left ventricle has normal function. The left ventricle has no regional  wall motion abnormalities. Left ventricular diastolic parameters are  indeterminate.   2. Right ventricular systolic function is normal. The right ventricular  size is normal. There is normal pulmonary artery systolic pressure.   3. Left atrial size was moderately dilated.   4. Right atrial size was mildly dilated.   5. The mitral valve is degenerative. Mild mitral valve regurgitation. No  evidence of mitral stenosis. Moderate mitral annular calcification.   6. Tricuspid valve regurgitation is mild to moderate.   7. The aortic valve is tricuspid. Aortic valve regurgitation is not  visualized. Mild to moderate aortic valve sclerosis/calcification is  present, without any evidence of aortic stenosis.   8. The inferior vena cava is normal in size with greater than 50%  respiratory variability, suggesting right atrial pressure of 3 mmHg.  Epic records are reviewed at length today  CHA2DS2-VASc Score = 5  The patient's score is based upon: CHF History: 0 HTN History: 1 Diabetes History: 0 Stroke History: 0 Vascular Disease History: 1 Age Score: 2 Gender Score: 1            ASSESSMENT AND PLAN: Persistent Atrial Fibrillation/atrial flutter The patient's CHA2DS2-VASc score is 5, indicating a 7.2% annual risk of stroke. S/p afib ablation 12/10/20 and 12/12/22 Patient appears to be back in atrial flutter. We discussed rhythm control options today. After discussing the risks and benefits, will plan for DCCV. If she has quick return of afib or flutter, she would be willing to consider dofetilide admission.  Continue Eliquis 5 mg BID  Continue Toprol 50 mg daily, may try an extra 1/2 dose to slow the heart rate.   Secondary Hypercoagulable State (ICD10:   D68.69) The patient is at significant risk for stroke/thromboembolism based upon her CHA2DS2-VASc Score of 5.  Continue Apixaban (Eliquis).   HTN Stable on current regimen  CAD CAC score 366 Intolerant of statins, seen in lipid clinic. No anginal symptoms   Follow up in the AF clinic post DCCV.    Informed Consent   Shared Decision Making/Informed Consent The risks (stroke, cardiac arrhythmias rarely resulting in the need for a temporary or permanent pacemaker, skin irritation or burns and complications associated with conscious sedation including aspiration, arrhythmia, respiratory failure and death), benefits (restoration of normal sinus rhythm) and alternatives of a direct current cardioversion were explained in detail to Ms. Bigler and she agrees to proceed.       Jorja Loa PA-C [facility] [address] [place], [place] [address] [phone] 08/25/2023 10:02 AM

## 2023-08-25 NOTE — H&P (View-Only) (Signed)
 Primary Care Physician: Madelin Headings, MD Primary Cardiologist: Dr Anne Fu Primary Electrophysiologist: Dr Lalla Brothers Referring Physician: Dr Rinaldo Cloud is a 77 y.o. female with a history of HTN, HLD, CAD, and atrial fibrillation who presents for follow up in the [facility]. The patient was initially diagnosed with atrial fibrillation 06/25/20 after presenting to urgent care with symptoms of dizziness and palpitations. Patient is on Eliquis for a CHADS2VASC score of 5. She underwent DCCV on 08/15/20. Unfortunately, she felt she was back in afib with palpitations about 3 weeks later. Patient is s/p afib ablation with Dr Lalla Brothers on 12/10/20.   On 03/16/22 she noted an elevated heart rate with tachypalpitations. She was in atrial flutter and underwent DCCV on 04/10/22. She was seen by Dr Lalla Brothers on 05/02/22 and watchful waiting was pursued at that time. She had increased burden of atrial flutter and is s/p repeat ablation 12/12/22.  On follow up today, patient reports that last evening she had pain on the lateral aspect of her ankle, unknown mechanism of injury. She noticed her heart rates have been elevated since that time. Her ankle pain resolved. No bleeding issues on anticoagulation.   Today, she denies symptoms of chest pain, shortness of breath, orthopnea, PND, lower extremity edema, dizziness, presyncope, syncope, snoring, daytime somnolence, bleeding, or neurologic sequela. The patient is tolerating medications without difficulties and is otherwise without complaint today.    Atrial Fibrillation Risk Factors:  she does not have symptoms or diagnosis of sleep apnea. she does not have a history of rheumatic fever. she does not have a history of alcohol use. The patient does have a history of early familial atrial fibrillation or other arrhythmias. Son has afib.   Atrial Fibrillation Management history:  Previous antiarrhythmic drugs: none Previous  cardioversions: 08/15/20, 04/10/22 Previous ablations: 12/10/20, 11/04/22 Anticoagulation history: Eliquis   Past Medical History:  Diagnosis Date   Cataract 10/18/2011   Followed by opthalmology.    COLONIC POLYPS, HX OF 10/12/2009   COVID-19 03/2020   HYPERLIPIDEMIA 05/31/2007   NEPHROLITHIASIS, HX OF 05/31/2007    Current Outpatient Medications  Medication Sig Dispense Refill   acetaminophen (TYLENOL) 500 MG tablet Take 500 mg by mouth every 6 (six) hours as needed for moderate pain.     apixaban (ELIQUIS) 5 MG TABS tablet TAKE 1 TABLET TWICE DAILY 180 tablet 1   Carboxymethylcellul-Glycerin (LUBRICATING EYE DROPS OP) Place 1 drop into both eyes daily as needed (dry eyes).     Cholecalciferol (VITAMIN D) 2000 UNITS tablet Take 2,000 Units by mouth daily.     melatonin 5 MG TABS Take 5 mg by mouth at bedtime as needed (sleep).     metoprolol succinate (TOPROL-XL) 50 MG 24 hr tablet TAKE 1 TABLET DAILY WITH OR IMMEDIATELY FOLLOWING A MEAL. 90 tablet 3   Multiple Vitamins-Minerals (ADULT GUMMY PO) Take 1 capsule by mouth 2 (two) times a week.     Multiple Vitamins-Minerals (LUTEIN-ZEAXANTHIN PO) Take 1 tablet by mouth daily.     No current facility-administered medications for this encounter.    ROS- All systems are reviewed and negative except as per the HPI above.  Physical Exam: Vitals:   08/25/23 0956  BP: (!) 110/90  Pulse: (!) 119  Weight: 62.8 kg  Height: 5' 4.5" (1.638 m)     GEN: Well nourished, well developed in no acute distress NECK: No JVD; No carotid bruits CARDIAC: Regular rate and rhythm, tachycardia, no murmurs, rubs,  gallops RESPIRATORY:  Clear to auscultation without rales, wheezing or rhonchi  ABDOMEN: Soft, non-tender, non-distended EXTREMITIES:  No edema; No deformity    Wt Readings from Last 3 Encounters:  08/25/23 62.8 kg  03/11/23 60.8 kg  03/10/23 60.9 kg    EKG today demonstrates  Atypical atrial flutter with 2:1 block Vent. rate 119 BPM PR  interval 200 ms QRS duration 78 ms QT/QTcB 342/481 ms   Echo 08/10/20 demonstrated  1. Left ventricular ejection fraction, by estimation, is 60 to 65%. The  left ventricle has normal function. The left ventricle has no regional  wall motion abnormalities. Left ventricular diastolic parameters are  indeterminate.   2. Right ventricular systolic function is normal. The right ventricular  size is normal. There is normal pulmonary artery systolic pressure.   3. Left atrial size was moderately dilated.   4. Right atrial size was mildly dilated.   5. The mitral valve is degenerative. Mild mitral valve regurgitation. No  evidence of mitral stenosis. Moderate mitral annular calcification.   6. Tricuspid valve regurgitation is mild to moderate.   7. The aortic valve is tricuspid. Aortic valve regurgitation is not  visualized. Mild to moderate aortic valve sclerosis/calcification is  present, without any evidence of aortic stenosis.   8. The inferior vena cava is normal in size with greater than 50%  respiratory variability, suggesting right atrial pressure of 3 mmHg.  Epic records are reviewed at length today  CHA2DS2-VASc Score = 5  The patient's score is based upon: CHF History: 0 HTN History: 1 Diabetes History: 0 Stroke History: 0 Vascular Disease History: 1 Age Score: 2 Gender Score: 1            ASSESSMENT AND PLAN: Persistent Atrial Fibrillation/atrial flutter The patient's CHA2DS2-VASc score is 5, indicating a 7.2% annual risk of stroke. S/p afib ablation 12/10/20 and 12/12/22 Patient appears to be back in atrial flutter. We discussed rhythm control options today. After discussing the risks and benefits, will plan for DCCV. If she has quick return of afib or flutter, she would be willing to consider dofetilide admission.  Continue Eliquis 5 mg BID  Continue Toprol 50 mg daily, may try an extra 1/2 dose to slow the heart rate.   Secondary Hypercoagulable State (ICD10:   D68.69) The patient is at significant risk for stroke/thromboembolism based upon her CHA2DS2-VASc Score of 5.  Continue Apixaban (Eliquis).   HTN Stable on current regimen  CAD CAC score 366 Intolerant of statins, seen in lipid clinic. No anginal symptoms   Follow up in the AF clinic post DCCV.    Informed Consent   Shared Decision Making/Informed Consent The risks (stroke, cardiac arrhythmias rarely resulting in the need for a temporary or permanent pacemaker, skin irritation or burns and complications associated with conscious sedation including aspiration, arrhythmia, respiratory failure and death), benefits (restoration of normal sinus rhythm) and alternatives of a direct current cardioversion were explained in detail to Ms. Bigler and she agrees to proceed.       Jorja Loa PA-C [facility] [address] [place], [place] [address] [phone] 08/25/2023 10:02 AM

## 2023-08-25 NOTE — Patient Instructions (Addendum)
Increase metoprolol to 75mg  daily until day of cardioversion then reduce back to normal dosing  Cardioversion scheduled for: Monday, December 23rd   - Arrive at the [facility] and go to admitting at [facility] not eat or drink anything after midnight the night prior to your procedure.   - Take all your morning medication (except diabetic medications) with a sip of water prior to arrival.  - You will not be able to drive home after your procedure.    - Do NOT miss any doses of your blood thinner - if you should miss a dose please notify our office immediately.   - If you feel as if you go back into normal rhythm prior to scheduled cardioversion, please notify our office immediately.   If your procedure is canceled in the cardioversion suite you will be charged a cancellation fee.

## 2023-08-26 ENCOUNTER — Other Ambulatory Visit (HOSPITAL_COMMUNITY): Payer: Self-pay | Admitting: *Deleted

## 2023-08-26 ENCOUNTER — Telehealth (HOSPITAL_COMMUNITY): Payer: Self-pay | Admitting: *Deleted

## 2023-08-26 NOTE — Telephone Encounter (Signed)
Patient cardioversion rescheduled for 12/26 per pt request due to scheduling conflict. Arrival at 8am. NPO after MN.

## 2023-09-01 NOTE — Progress Notes (Signed)
Left detailed voice message for patient and instructed them to come at 0730  and to be NPO after 0000.  Medications reviewed.    Advised patient to have a ride home and someone to stay with them for 24 hours after the procedure.

## 2023-09-02 NOTE — Anesthesia Preprocedure Evaluation (Signed)
Anesthesia Evaluation  Patient identified by MRN, date of birth, ID band Patient awake    Reviewed: Allergy & Precautions, H&P , NPO status , Patient's Chart, lab work & pertinent test results  Airway Mallampati: II  TM Distance: >3 FB Neck ROM: Full    Dental no notable dental hx. (+) Teeth Intact, Dental Advisory Given   Pulmonary neg pulmonary ROS, former smoker   Pulmonary exam normal breath sounds clear to auscultation       Cardiovascular Exercise Tolerance: Good + CAD  + dysrhythmias Atrial Fibrillation  Rhythm:Irregular Rate:Tachycardia     Neuro/Psych negative neurological ROS  negative psych ROS   GI/Hepatic negative GI ROS, Neg liver ROS,,,  Endo/Other  negative endocrine ROS    Renal/GU negative Renal ROS  negative genitourinary   Musculoskeletal  (+) Arthritis , Osteoarthritis,    Abdominal   Peds  Hematology negative hematology ROS (+)   Anesthesia Other Findings   Reproductive/Obstetrics negative OB ROS                             Anesthesia Physical Anesthesia Plan  ASA: 3  Anesthesia Plan: General   Post-op Pain Management: Minimal or no pain anticipated   Induction: Intravenous  PONV Risk Score and Plan: 3 and Propofol infusion  Airway Management Planned: Natural Airway and Simple Face Mask  Additional Equipment:   Intra-op Plan:   Post-operative Plan:   Informed Consent: I have reviewed the patients History and Physical, chart, labs and discussed the procedure including the risks, benefits and alternatives for the proposed anesthesia with the patient or authorized representative who has indicated his/her understanding and acceptance.     Dental advisory given  Plan Discussed with: CRNA  Anesthesia Plan Comments:        Anesthesia Quick Evaluation

## 2023-09-03 ENCOUNTER — Other Ambulatory Visit: Payer: Self-pay | Admitting: Cardiology

## 2023-09-03 ENCOUNTER — Encounter (HOSPITAL_COMMUNITY)
Admission: RE | Disposition: A | Discharge status: Home / Self Care | Payer: Self-pay | Source: Home / Self Care | Attending: Internal Medicine

## 2023-09-03 ENCOUNTER — Ambulatory Visit (HOSPITAL_COMMUNITY)
Admission: RE | Admit: 2023-09-03 | Discharge: 2023-09-03 | Disposition: A | Discharge status: Home / Self Care | Payer: Medicare PPO | Attending: Internal Medicine | Admitting: Internal Medicine

## 2023-09-03 ENCOUNTER — Other Ambulatory Visit: Payer: Self-pay

## 2023-09-03 ENCOUNTER — Ambulatory Visit (HOSPITAL_COMMUNITY): Payer: Self-pay | Admitting: Anesthesiology

## 2023-09-03 ENCOUNTER — Encounter (HOSPITAL_COMMUNITY): Payer: Self-pay | Admitting: Internal Medicine

## 2023-09-03 ENCOUNTER — Ambulatory Visit (HOSPITAL_BASED_OUTPATIENT_CLINIC_OR_DEPARTMENT_OTHER): Payer: Medicare PPO | Admitting: Anesthesiology

## 2023-09-03 DIAGNOSIS — I4892 Unspecified atrial flutter: Secondary | ICD-10-CM | POA: Diagnosis not present

## 2023-09-03 DIAGNOSIS — E785 Hyperlipidemia, unspecified: Secondary | ICD-10-CM | POA: Diagnosis not present

## 2023-09-03 DIAGNOSIS — I484 Atypical atrial flutter: Secondary | ICD-10-CM

## 2023-09-03 DIAGNOSIS — Z87891 Personal history of nicotine dependence: Secondary | ICD-10-CM | POA: Diagnosis not present

## 2023-09-03 DIAGNOSIS — I2510 Atherosclerotic heart disease of native coronary artery without angina pectoris: Secondary | ICD-10-CM | POA: Diagnosis not present

## 2023-09-03 DIAGNOSIS — I4891 Unspecified atrial fibrillation: Secondary | ICD-10-CM

## 2023-09-03 DIAGNOSIS — D6869 Other thrombophilia: Secondary | ICD-10-CM | POA: Insufficient documentation

## 2023-09-03 DIAGNOSIS — I10 Essential (primary) hypertension: Secondary | ICD-10-CM | POA: Insufficient documentation

## 2023-09-03 DIAGNOSIS — I4819 Other persistent atrial fibrillation: Secondary | ICD-10-CM | POA: Insufficient documentation

## 2023-09-03 DIAGNOSIS — Z79899 Other long term (current) drug therapy: Secondary | ICD-10-CM | POA: Diagnosis not present

## 2023-09-03 DIAGNOSIS — Z7901 Long term (current) use of anticoagulants: Secondary | ICD-10-CM | POA: Diagnosis not present

## 2023-09-03 HISTORY — PX: CARDIOVERSION: EP1203

## 2023-09-03 SURGERY — CARDIOVERSION (CATH LAB)
Anesthesia: General

## 2023-09-03 MED ORDER — PROPOFOL 10 MG/ML IV BOLUS
INTRAVENOUS | Status: DC | PRN
  Administered 2023-09-03: 50 mg via INTRAVENOUS

## 2023-09-03 MED ORDER — SODIUM CHLORIDE 0.9% FLUSH: 10.0000 mL | Freq: Two times a day (BID) | INTRAVENOUS | Status: DC

## 2023-09-03 MED ORDER — LIDOCAINE 2% (20 MG/ML) 5 ML SYRINGE
INTRAMUSCULAR | Status: DC | PRN
  Administered 2023-09-03: 60 mg via INTRAVENOUS

## 2023-09-03 SURGICAL SUPPLY — 1 items: PAD DEFIB RADIO PHYSIO CONN (PAD) ×1 IMPLANT

## 2023-09-03 NOTE — Discharge Instructions (Signed)

## 2023-09-03 NOTE — Interval H&P Note (Signed)
History and Physical Interval Note:  09/03/2023 8:01 AM  Nichole Berg  has presented today for surgery, with the diagnosis of AFIB.  The various methods of treatment have been discussed with the patient and family. After consideration of risks, benefits and other options for treatment, the patient has consented to  Procedure(s): CARDIOVERSION (N/A) as a surgical intervention.  The patient's history has been reviewed, patient examined, no change in status, stable for surgery.  I have reviewed the patient's chart and labs.  Questions were answered to the patient's satisfaction.     [name]

## 2023-09-03 NOTE — Anesthesia Postprocedure Evaluation (Signed)
Anesthesia Post Note  Patient: Nichole Berg  Procedure(s) Performed: CARDIOVERSION     Patient location during evaluation: Cath Lab Anesthesia Type: General Level of consciousness: awake and alert Pain management: pain level controlled Vital Signs Assessment: post-procedure vital signs reviewed and stable Respiratory status: spontaneous breathing, nonlabored ventilation and respiratory function stable Cardiovascular status: blood pressure returned to baseline and stable Postop Assessment: no apparent nausea or vomiting Anesthetic complications: no  No notable events documented.  Last Vitals:  Vitals:   09/03/23 1000 09/03/23 1010  BP: (!) 86/55 100/62  Pulse: (!) 52 (!) 55  Resp: 18 16  Temp:    SpO2: 98% 98%    Last Pain:  Vitals:   09/03/23 1010  TempSrc:   PainSc: 0-No pain                 Tama Grosz,W. EDMOND

## 2023-09-03 NOTE — Transfer of Care (Signed)
Immediate Anesthesia Transfer of Care Note  Patient: Nichole Berg  Procedure(s) Performed: CARDIOVERSION  Patient Location: PACU and Cath Lab  Anesthesia Type:General  Level of Consciousness: awake and patient cooperative  Airway & Oxygen Therapy: Patient Spontanous Breathing and Patient connected to nasal cannula oxygen  Post-op Assessment: Report given to RN and Post -op Vital signs reviewed and stable  Post vital signs: Reviewed and stable  Last Vitals:  Vitals Value Taken Time  BP    Temp    Pulse 119 09/03/23 0936  Resp 20 09/03/23 0937  SpO2 97 % 09/03/23 0936  Vitals shown include unfiled device data.  Last Pain:  Vitals:   09/03/23 0741  TempSrc: Temporal         Complications: No notable events documented.

## 2023-09-03 NOTE — CV Procedure (Signed)
Electrical Cardioversion Procedure Note Nichole Berg [id number] [birth year]  Procedure: Electrical Cardioversion Indications:  Atrial Fibrillation  Procedure Details Consent: Risks of procedure as well as the alternatives and risks of each were explained to the (patient/caregiver).  Consent for procedure obtained. Time Out: Verified patient identification, verified procedure, site/side was marked, verified correct patient position, special equipment/implants available, medications/allergies/relevent history reviewed, required imaging and test results available.  Performed  Patient placed on cardiac monitor, pulse oximetry, supplemental oxygen as necessary.  Sedation given:  propofol and lidocaine  Pacer pads placed anterior and posterior chest.  Cardioverted 1 time(s).  Cardioverted at 200J.biphasic  Evaluation Findings: Post procedure EKG shows: NSR Complications: None Patient did tolerate procedure well.   Nichole Berg 09/03/2023, 9:48 AM

## 2023-09-03 NOTE — Telephone Encounter (Signed)
Prescription refill request for Eliquis received. Indication: Afib  Last office visit: 08/25/23 Charlean Merl)  Scr: 0.62 (08/25/23)  Age: 77 Weight: 62.8kg  Appropriate dose. Refill sent.

## 2023-09-04 ENCOUNTER — Encounter (HOSPITAL_COMMUNITY): Payer: Self-pay | Admitting: Internal Medicine

## 2023-09-07 ENCOUNTER — Encounter: Payer: Medicare PPO | Admitting: Internal Medicine

## 2023-09-07 NOTE — Progress Notes (Signed)
 Chief Complaint  Patient presents with   Annual Exam    HPI: Patient  Nichole Berg  77 y.o. comes in today for Preventive Health Care visit  Is generally well   Main condition in recurrent afib  CV 12 26 for persit AF for flutter     not candidate   for future ablation  and med if needed.  Afib clinic.  Had  aflutter  conitnues on anticoag. Somewhat frustrated at this time but ok physically. Had fever next day 101  and chills and a minor cough and now better .  Can now deep breath. And a lot better no concern  HLD had been on very low dose pravastatin  but had sx when inc dose. Uncertain risk benefit of intervnetions Consider repatha  at this timenot sure.  Parents lived into 90s  some vascular hx and chl pacer?  Brother has had afib and osa  Health Maintenance  Topic Date Due   Medicare Annual Wellness (AWV)  10/30/2023   COVID-19 Vaccine (4 - 2024-25 season) 09/24/2023 (Originally 05/10/2023)   DTaP/Tdap/Td (4 - Td or Tdap) 06/05/2031   Pneumonia Vaccine 26+ Years old  Completed   INFLUENZA VACCINE  Completed   DEXA SCAN  Completed   Hepatitis C Screening  Completed   Zoster Vaccines- Shingrix  Completed   HPV VACCINES  Aged Out   Colonoscopy  Discontinued   Health Maintenance Review LIFESTYLE:  Exercise:  water aerobics and  dog walking  1.5 per day  Tobacco/ETS: n Alcohol: n Sugar beverages:n Sleep: about 6 if lucky   Drug use: no HH of 2 1 pet  Had colon and 2 benign polyps   ROS:  GEN/ HEENT: No fever, significant weight changes sweats headaches vision problems hearing changes, CV/ PULM; No chest pain shortness of breath cough, syncope,edema  change in exercise tolerance. GI /GU: No adominal pain, vomiting, change in bowel habits. No blood in the stool. No significant GU symptoms. SKIN/HEME: ,no acute skin rashes suspicious lesions or bleeding. No lymphadenopathy, nodules, masses.  NEURO/ PSYCH:  No neurologic signs such as weakness numbness. No depression  anxiety. IMM/ Allergy: No unusual infections.  Allergy .   REST of 12 system review negative except as per HPI   Past Medical History:  Diagnosis Date   Cataract 10/18/2011   Followed by opthalmology.    COLONIC POLYPS, HX OF 10/12/2009   COVID-19 03/2020   HYPERLIPIDEMIA 05/31/2007   NEPHROLITHIASIS, HX OF 05/31/2007    Past Surgical History:  Procedure Laterality Date   ATRIAL FIBRILLATION ABLATION N/A 12/10/2020   Procedure: ATRIAL FIBRILLATION ABLATION;  Surgeon: Cindie Ole DASEN, MD;  Location: [facility] INVASIVE CV LAB;  Service: Cardiovascular;  Laterality: N/A;   ATRIAL FIBRILLATION ABLATION N/A 12/12/2022   Procedure: ATRIAL FIBRILLATION ABLATION;  Surgeon: Cindie Ole DASEN, MD;  Location: [facility] INVASIVE CV LAB;  Service: Cardiovascular;  Laterality: N/A;   CARDIOVERSION N/A 08/15/2020   Procedure: CARDIOVERSION;  Surgeon: Hobart Powell BRAVO, MD;  Location: [facility] ENDOSCOPY;  Service: Cardiovascular;  Laterality: N/A;   CARDIOVERSION N/A 04/10/2022   Procedure: CARDIOVERSION;  Surgeon: Santo Stanly LABOR, MD;  Location: [facility] ENDOSCOPY;  Service: Cardiovascular;  Laterality: N/A;   CARDIOVERSION N/A 09/03/2023   Procedure: CARDIOVERSION;  Surgeon: Okey Vina GAILS, MD;  Location: [facility] INVASIVE CV LAB;  Service: Cardiovascular;  Laterality: N/A;   CATARACT EXTRACTION, BILATERAL      Family History  Problem Relation Age of Onset   Atrial fibrillation Mother  Has pacer doing very well age 55 passed suddnely age 89 poss CV    Social History   Socioeconomic History   Marital status: Single    Spouse name: Not on file   Number of children: Not on file   Years of education: Not on file   Highest education level: Not on file  Occupational History   Occupation: Retired  Tobacco Use   Smoking status: Former    Current packs/day: 0.00    Types: Cigarettes    Start date: 09/08/1962    Quit date: 09/08/1964    Years since quitting: 59.0   Smokeless tobacco: Never   Tobacco comments:     Former smoker 03/18/22  Vaping Use   Vaping status: Never Used  Substance and Sexual Activity   Alcohol use: Yes    Comment: occassional   Drug use: No   Sexual activity: Not on file  Other Topics Concern   Not on file  Social History Narrative   Retired publishing rights manager    Non smoker   Works as engineer, 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    [employer] 2 1 cat neg tob      Social Drivers of Corporate Investment Banker Strain: Low Risk  (10/29/2022)   Overall Financial Resource Strain (CARDIA)    Difficulty of Paying Living Expenses: Not hard at all  Food Insecurity: No Food Insecurity (10/29/2022)   Hunger Vital Sign    Worried About Running Out of Food in the Last Year: Never true    Ran Out of Food in the Last Year: Never true  Transportation Needs: No Transportation Needs (10/29/2022)   PRAPARE - Administrator, Civil Service (Medical): No    Lack of Transportation (Non-Medical): No  Physical Activity: Sufficiently Active (09/08/2023)   Exercise Vital Sign    Days of Exercise per Week: 7 days    Minutes of Exercise per Session: 30 min  Stress: Stress Concern Present (09/08/2023)   [employer] of Occupational Health - Occupational Stress Questionnaire    Feeling of Stress : To some extent  Social Connections: Moderately Isolated (10/29/2022)   Social Connection and Isolation Panel [NHANES]    Frequency of Communication with Friends and Family: More than three times a week    Frequency of Social Gatherings with Friends and Family: More than three times a week    Attends Religious Services: Never    Database Administrator or Organizations: No    Attends Banker Meetings: Never    Marital Status: Living with partner    Outpatient Medications Prior to Visit  Medication Sig Dispense Refill   acetaminophen  (TYLENOL ) 500 MG tablet Take 500 mg by mouth every 6 (six) hours as needed for moderate pain.     apixaban  (ELIQUIS ) 5 MG TABS tablet Take 1 tablet (5 mg  total) by mouth 2 (two) times daily. 180 tablet 1   Carboxymethylcellul-Glycerin (LUBRICATING EYE DROPS OP) Place 1 drop into both eyes daily as needed (dry eyes).     Cholecalciferol (VITAMIN D ) 2000 UNITS tablet Take 2,000 Units by mouth daily.     metoprolol  succinate (TOPROL -XL) 50 MG 24 hr tablet TAKE 1 TABLET DAILY WITH OR IMMEDIATELY FOLLOWING A MEAL. 90 tablet 3   Multiple Vitamins-Minerals (LUTEIN-ZEAXANTHIN PO) Take 1 tablet by mouth daily.     No facility-administered medications prior to visit.     EXAM:  BP 130/68 (BP Location: Left Arm, Patient Position: Sitting, Cuff  Size: Normal)   Pulse (!) 56   Temp (!) 97.5 F (36.4 C) (Oral)   Ht 5' 3.8 (1.621 m)   Wt 138 lb 6.4 oz (62.8 kg)   SpO2 97%   BMI 23.91 kg/m   Body mass index is 23.91 kg/m. Wt Readings from Last 3 Encounters:  09/08/23 138 lb 6.4 oz (62.8 kg)  08/25/23 138 lb 6.4 oz (62.8 kg)  03/11/23 134 lb (60.8 kg)    Physical Exam: Vital signs reviewed HZW:Uypd is a well-developed well-nourished alert cooperative    who appearsr stated age in no acute distress.  HEENT: normocephalic atraumatic , Eyes: PERRL EOM's full, conjunctiva clear, Nares: paten,t no deformity discharge or tenderness., Ears: no deformity EAC's clear TMs with normal landmarks. Mouth: clear OP, no lesions, edema.  Moist mucous membranes. Dentition in adequate repair. NECK: supple without masses, thyromegaly or bruits. CHEST/PULM:  Clear to auscultation and percussion breath sounds equal no wheeze , rales or rhonchi. No chest wall deformities or tenderness. Breast: normal by inspection . No dimpling, discharge, masses, tenderness or discharge . CV: PMI is nondisplaced, S1 S2 no gallops, murmurs, rubs. Peripheral pulses are full without delay.No JVD .  ABDOMEN: Bowel sounds normal nontender  No guard or rebound, no hepato splenomegal no CVA tenderness.  Extremtities:  No clubbing cyanosis or edema, no acute joint swelling or redness no  focal atrophy NEURO:  Oriented x3, cranial nerves 3-12 appear to be intact, no obvious focal weakness,gait within normal limits no abnormal reflexes or asymmetrical SKIN: No acute rashes normal turgor, color, no bruising or petechiae. PSYCH: Oriented, good eye contact, no obvious depression anxiety, cognition and judgment appear normal. LN: no cervical axillary adenopathy  Lab Results  Component Value Date   WBC 7.3 08/25/2023   HGB 12.4 08/25/2023   HCT 38.2 08/25/2023   PLT 248 08/25/2023   GLUCOSE 104 (H) 08/25/2023   CHOL 237 (H) 12/03/2021   TRIG 66.0 12/03/2021   HDL 70.40 12/03/2021   LDLDIRECT 160.3 10/13/2011   LDLCALC 153 (H) 12/03/2021   ALT 18 12/03/2021   AST 25 12/03/2021   NA 137 08/25/2023   K 4.3 08/25/2023   CL 105 08/25/2023   CREATININE 0.62 08/25/2023   BUN 11 08/25/2023   CO2 24 08/25/2023   TSH 1.54 12/03/2021   HGBA1C 5.4 08/10/2020   HGBA1C 5.4 08/10/2020    BP Readings from Last 3 Encounters:  09/08/23 130/68  09/03/23 100/62  08/25/23 (!) 110/90    Lab  plan reviewed with patient fasting   ASSESSMENT AND PLAN:  Discussed the following assessment and plan:    ICD-10-CM   1. Visit for preventive health examination  Z00.00     2. Hyperlipidemia, unspecified hyperlipidemia type  E78.5 Lipid panel    TSH    Hemoglobin A1c    Hepatic function panel    3. Coronary artery calcification  I25.10 Lipid panel    TSH    Hemoglobin A1c    Hepatic function panel    4. Anticoagulant long-term use  Z79.01 Lipid panel    TSH    Hemoglobin A1c    Hepatic function panel    5. Atrial fibrillation, unspecified type (HCC)  I48.91 Lipid panel    TSH    Hemoglobin A1c    Hepatic function panel   recurrent s/p ablation and cv for aflutter currently in sinus rhand no sx    Disc uncertain   risk benefit for her for repatha but tolerated  well. Although she is almost 29 n she is active and younger than stated age)   reasonable to  consider .    Last  vitamin D  Lab Results  Component Value Date   VD25OH 50.00 06/16/2014    Return in about 1 year (around 09/07/2024) for depending on results.  Patient Care Team: Darlena Koval, Apolinar POUR, MD as PCP - General Jeffrie Oneil BROCKS, MD as PCP - Cardiology (Cardiology) Cindie Ole DASEN, MD as PCP - Electrophysiology (Cardiology) Rosan Credit, MD as Consulting Physician (Ophthalmology) Patient Instructions  Good to see you today .  Exam is normal heart in regular rhythm  Consider  other interventions such as repatha  for primary prevention  and high calcium  score . But  uncertain   based on age data.  Agree getting RSV vaccine.   Zofia Peckinpaugh K. Shanna Strength M.D.

## 2023-09-08 ENCOUNTER — Ambulatory Visit (INDEPENDENT_AMBULATORY_CARE_PROVIDER_SITE_OTHER): Payer: Medicare PPO | Admitting: Internal Medicine

## 2023-09-08 ENCOUNTER — Encounter: Payer: Self-pay | Admitting: Internal Medicine

## 2023-09-08 VITALS — BP 130/68 | HR 56 | Temp 97.5°F | Ht 63.8 in | Wt 138.4 lb

## 2023-09-08 DIAGNOSIS — E785 Hyperlipidemia, unspecified: Secondary | ICD-10-CM | POA: Diagnosis not present

## 2023-09-08 DIAGNOSIS — Z0000 Encounter for general adult medical examination without abnormal findings: Secondary | ICD-10-CM | POA: Diagnosis not present

## 2023-09-08 DIAGNOSIS — I2510 Atherosclerotic heart disease of native coronary artery without angina pectoris: Secondary | ICD-10-CM

## 2023-09-08 DIAGNOSIS — Z7901 Long term (current) use of anticoagulants: Secondary | ICD-10-CM | POA: Diagnosis not present

## 2023-09-08 DIAGNOSIS — I4891 Unspecified atrial fibrillation: Secondary | ICD-10-CM | POA: Diagnosis not present

## 2023-09-08 DIAGNOSIS — I4819 Other persistent atrial fibrillation: Secondary | ICD-10-CM

## 2023-09-08 LAB — LIPID PANEL
Cholesterol: 208 mg/dL — ABNORMAL HIGH (ref 0–200)
HDL: 60.3 mg/dL (ref >39.00)
LDL Cholesterol: 133 mg/dL — ABNORMAL HIGH (ref 0–99)
NonHDL: 148.16
Total CHOL/HDL Ratio: 3
Triglycerides: 74 mg/dL (ref 0.0–149.0)
VLDL: 14.8 mg/dL (ref 0.0–40.0)

## 2023-09-08 LAB — HEMOGLOBIN A1C: Hgb A1c MFr Bld: 5.9 % (ref 4.6–6.5)

## 2023-09-08 LAB — HEPATIC FUNCTION PANEL
ALT: 26 U/L (ref 0–35)
AST: 27 U/L (ref 0–37)
Albumin: 4.2 g/dL (ref 3.5–5.2)
Alkaline Phosphatase: 84 U/L (ref 39–117)
Bilirubin, Direct: 0.2 mg/dL (ref 0.0–0.3)
Total Bilirubin: 1.1 mg/dL (ref 0.2–1.2)
Total Protein: 6.5 g/dL (ref 6.0–8.3)

## 2023-09-08 LAB — TSH: TSH: 2.11 u[IU]/mL (ref 0.35–5.50)

## 2023-09-08 NOTE — Patient Instructions (Signed)
Good to see you today .  Exam is normal heart in regular rhythm  Consider  other interventions such as repatha  for primary prevention  and high calcium score . But  uncertain   based on age data.  Agree getting RSV vaccine.

## 2023-09-08 NOTE — Progress Notes (Signed)
Lipid level is improved , no diabetes, thyroid in range

## 2023-09-16 ENCOUNTER — Encounter (HOSPITAL_COMMUNITY): Payer: Self-pay | Admitting: Physician Assistant

## 2023-09-16 ENCOUNTER — Ambulatory Visit (HOSPITAL_COMMUNITY)
Admission: RE | Admit: 2023-09-16 | Discharge: 2023-09-16 | Disposition: A | Discharge status: Home / Self Care | Payer: Medicare PPO | Source: Ambulatory Visit | Attending: Physician Assistant | Admitting: Physician Assistant

## 2023-09-16 VITALS — BP 122/86 | HR 56 | Ht 63.8 in | Wt 135.0 lb

## 2023-09-16 DIAGNOSIS — Z01810 Encounter for preprocedural cardiovascular examination: Secondary | ICD-10-CM | POA: Diagnosis not present

## 2023-09-16 DIAGNOSIS — I4819 Other persistent atrial fibrillation: Secondary | ICD-10-CM | POA: Insufficient documentation

## 2023-09-16 DIAGNOSIS — E785 Hyperlipidemia, unspecified: Secondary | ICD-10-CM | POA: Insufficient documentation

## 2023-09-16 DIAGNOSIS — Z79899 Other long term (current) drug therapy: Secondary | ICD-10-CM | POA: Insufficient documentation

## 2023-09-16 DIAGNOSIS — I4892 Unspecified atrial flutter: Secondary | ICD-10-CM | POA: Diagnosis not present

## 2023-09-16 DIAGNOSIS — M9901 Segmental and somatic dysfunction of cervical region: Secondary | ICD-10-CM | POA: Diagnosis not present

## 2023-09-16 DIAGNOSIS — I2510 Atherosclerotic heart disease of native coronary artery without angina pectoris: Secondary | ICD-10-CM | POA: Insufficient documentation

## 2023-09-16 DIAGNOSIS — D6869 Other thrombophilia: Secondary | ICD-10-CM | POA: Diagnosis not present

## 2023-09-16 DIAGNOSIS — Z7901 Long term (current) use of anticoagulants: Secondary | ICD-10-CM | POA: Insufficient documentation

## 2023-09-16 DIAGNOSIS — I10 Essential (primary) hypertension: Secondary | ICD-10-CM | POA: Insufficient documentation

## 2023-09-16 DIAGNOSIS — M5411 Radiculopathy, occipito-atlanto-axial region: Secondary | ICD-10-CM | POA: Diagnosis not present

## 2023-09-16 NOTE — Progress Notes (Signed)
 Primary Care Physician: Charlett Apolinar POUR, MD Primary Cardiologist: Dr Jeffrie Primary Electrophysiologist: Dr Cindie Referring Physician: Dr Jeffrie Nichole Berg is a 78 y.o. female with a history of HTN, HLD, CAD, and atrial fibrillation who presents for follow up in the [facility]. The patient was initially diagnosed with atrial fibrillation 06/25/20 after presenting to urgent care with symptoms of dizziness and palpitations. Patient is on Eliquis  for a CHADS2VASC score of 5. She underwent DCCV on 08/15/20. Unfortunately, she felt she was back in afib with palpitations about 3 weeks later. Patient is s/p afib ablation with Dr Cindie on 12/10/20.   On 03/16/22 she noted an elevated heart rate with tachypalpitations. She was in atrial flutter and underwent DCCV on 04/10/22. She was seen by Dr Cindie on 05/02/22 and watchful waiting was pursued at that time. She had increased burden of atrial flutter and is s/p repeat ablation 12/12/22.  On follow up today, patient is s/p DCCV on 09/03/23. Patient reports that the day after her DCCV she developed a fever which lasted a couple days. She felt nauseated but no vomiting or other symptoms. She feels well today and remains in SR.  Today, she denies symptoms of palpitations, chest pain, shortness of breath, orthopnea, PND, lower extremity edema, dizziness, presyncope, syncope, snoring, daytime somnolence, bleeding, or neurologic sequela. The patient is tolerating medications without difficulties and is otherwise without complaint today.    Atrial Fibrillation Risk Factors:  she does not have symptoms or diagnosis of sleep apnea. she does not have a history of rheumatic fever. she does not have a history of alcohol use. The patient does have a history of early familial atrial fibrillation or other arrhythmias. Son has afib.   Atrial Fibrillation Management history:  Previous antiarrhythmic drugs: none Previous cardioversions:  08/15/20, 04/10/22, 09/03/23 Previous ablations: 12/10/20, 11/04/22 Anticoagulation history: Eliquis    Past Medical History:  Diagnosis Date   Cataract 10/18/2011   Followed by opthalmology.    COLONIC POLYPS, HX OF 10/12/2009   COVID-19 03/2020   HYPERLIPIDEMIA 05/31/2007   NEPHROLITHIASIS, HX OF 05/31/2007    Current Outpatient Medications  Medication Sig Dispense Refill   acetaminophen  (TYLENOL ) 500 MG tablet Take 500 mg by mouth every 6 (six) hours as needed for moderate pain.     apixaban  (ELIQUIS ) 5 MG TABS tablet Take 1 tablet (5 mg total) by mouth 2 (two) times daily. 180 tablet 1   Carboxymethylcellul-Glycerin (LUBRICATING EYE DROPS OP) Place 1 drop into both eyes daily as needed (dry eyes).     Cholecalciferol (VITAMIN D ) 2000 UNITS tablet Take 2,000 Units by mouth daily.     metoprolol  succinate (TOPROL -XL) 50 MG 24 hr tablet TAKE 1 TABLET DAILY WITH OR IMMEDIATELY FOLLOWING A MEAL. 90 tablet 3   Multiple Vitamins-Minerals (LUTEIN-ZEAXANTHIN PO) Take 1 tablet by mouth daily.     No current facility-administered medications for this encounter.    ROS- All systems are reviewed and negative except as per the HPI above.  Physical Exam: Vitals:   09/16/23 0916  BP: 122/86  Pulse: (!) 56  Weight: 61.2 kg  Height: 5' 3.8 (1.621 m)    GEN: Well nourished, well developed in no acute distress NECK: No JVD; No carotid bruits CARDIAC: Regular rate and rhythm, no murmurs, rubs, gallops RESPIRATORY:  Clear to auscultation without rales, wheezing or rhonchi  ABDOMEN: Soft, non-tender, non-distended EXTREMITIES:  No edema; No deformity    Wt Readings from Last 3 Encounters:  09/16/23 61.2 kg  09/08/23 62.8 kg  08/25/23 62.8 kg    EKG today demonstrates  SB, PAC, 1st degree AV block Vent. rate 56 BPM PR interval 224 ms QRS duration 88 ms QT/QTcB 470/453 ms   Echo 08/10/20 demonstrated  1. Left ventricular ejection fraction, by estimation, is 60 to 65%. The  left ventricle  has normal function. The left ventricle has no regional  wall motion abnormalities. Left ventricular diastolic parameters are  indeterminate.   2. Right ventricular systolic function is normal. The right ventricular  size is normal. There is normal pulmonary artery systolic pressure.   3. Left atrial size was moderately dilated.   4. Right atrial size was mildly dilated.   5. The mitral valve is degenerative. Mild mitral valve regurgitation. No  evidence of mitral stenosis. Moderate mitral annular calcification.   6. Tricuspid valve regurgitation is mild to moderate.   7. The aortic valve is tricuspid. Aortic valve regurgitation is not  visualized. Mild to moderate aortic valve sclerosis/calcification is  present, without any evidence of aortic stenosis.   8. The inferior vena cava is normal in size with greater than 50%  respiratory variability, suggesting right atrial pressure of 3 mmHg.  Epic records are reviewed at length today  CHA2DS2-VASc Score = 5  The patient's score is based upon: CHF History: 0 HTN History: 1 Diabetes History: 0 Stroke History: 0 Vascular Disease History: 1 Age Score: 2 Gender Score: 1            ASSESSMENT AND PLAN: Persistent Atrial Fibrillation/atrial flutter The patient's CHA2DS2-VASc score is 5, indicating a 7.2% annual risk of stroke. S/p afib ablation 12/10/20 and 12/12/22 S/p DCCV 09/03/23 Patient back in SR. If she has quick return of afib or flutter, she would be willing to consider dofetilide admission.  Continue Eliquis  5 mg BID  Continue Toprol  50 mg daily, may try an extra 1/2 dose to slow the heart rate.   Secondary Hypercoagulable State (ICD10:  D68.69) The patient is at significant risk for stroke/thromboembolism based upon her CHA2DS2-VASc Score of 5.  Continue Apixaban  (Eliquis ).   HTN Stable on current regimen  CAD CAC score 366 Intolerant of statins, seen in lipid clinic. No anginal symptoms   Follow up with Dr Cindie  and Dr Jeffrie per recalls. AF clinic in one year.     Daril Kicks PA-C Afib Clinic [facility] [address] [place], [place] [address] [phone] 09/16/2023 9:26 AM

## 2023-09-17 ENCOUNTER — Telehealth: Payer: Self-pay | Admitting: *Deleted

## 2023-09-17 NOTE — Telephone Encounter (Signed)
 Copied from CRM [id number]. Topic: General - Call Back - No Documentation >> Sep 17, 2023  1:58 PM Leotis ORN wrote: Reason for CRM: PT returning missed you already reviewed results in mychart, could not find any notes for other reasons for call, pt stated if its pertaining to anything other than her results give her a callback [phone]

## 2023-09-17 NOTE — Telephone Encounter (Signed)
 Noted.

## 2023-09-30 DIAGNOSIS — Z1231 Encounter for screening mammogram for malignant neoplasm of breast: Secondary | ICD-10-CM | POA: Diagnosis not present

## 2023-09-30 LAB — HM MAMMOGRAPHY

## 2023-10-01 DIAGNOSIS — M8580 Other specified disorders of bone density and structure, unspecified site: Secondary | ICD-10-CM | POA: Diagnosis not present

## 2023-10-01 DIAGNOSIS — M8588 Other specified disorders of bone density and structure, other site: Secondary | ICD-10-CM | POA: Diagnosis not present

## 2023-10-01 LAB — HM DEXA SCAN

## 2023-10-14 DIAGNOSIS — M9901 Segmental and somatic dysfunction of cervical region: Secondary | ICD-10-CM | POA: Diagnosis not present

## 2023-10-14 DIAGNOSIS — M5411 Radiculopathy, occipito-atlanto-axial region: Secondary | ICD-10-CM | POA: Diagnosis not present

## 2023-10-20 ENCOUNTER — Encounter: Payer: Self-pay | Admitting: Internal Medicine

## 2023-10-20 NOTE — Progress Notes (Signed)
Low bone density  seems pretty stable  t -2.2

## 2023-11-10 DIAGNOSIS — M9901 Segmental and somatic dysfunction of cervical region: Secondary | ICD-10-CM | POA: Diagnosis not present

## 2023-11-10 DIAGNOSIS — M5411 Radiculopathy, occipito-atlanto-axial region: Secondary | ICD-10-CM | POA: Diagnosis not present

## 2023-12-15 DIAGNOSIS — M9901 Segmental and somatic dysfunction of cervical region: Secondary | ICD-10-CM | POA: Diagnosis not present

## 2023-12-15 DIAGNOSIS — M5411 Radiculopathy, occipito-atlanto-axial region: Secondary | ICD-10-CM | POA: Diagnosis not present

## 2023-12-17 ENCOUNTER — Encounter: Payer: Medicare PPO | Admitting: Family Medicine

## 2024-01-14 DIAGNOSIS — M5411 Radiculopathy, occipito-atlanto-axial region: Secondary | ICD-10-CM | POA: Diagnosis not present

## 2024-01-14 DIAGNOSIS — M9901 Segmental and somatic dysfunction of cervical region: Secondary | ICD-10-CM | POA: Diagnosis not present

## 2024-02-02 DIAGNOSIS — S80862A Insect bite (nonvenomous), left lower leg, initial encounter: Secondary | ICD-10-CM | POA: Diagnosis not present

## 2024-02-02 DIAGNOSIS — S80861A Insect bite (nonvenomous), right lower leg, initial encounter: Secondary | ICD-10-CM | POA: Diagnosis not present

## 2024-02-02 DIAGNOSIS — L578 Other skin changes due to chronic exposure to nonionizing radiation: Secondary | ICD-10-CM | POA: Diagnosis not present

## 2024-02-02 DIAGNOSIS — L821 Other seborrheic keratosis: Secondary | ICD-10-CM | POA: Diagnosis not present

## 2024-02-02 DIAGNOSIS — D1801 Hemangioma of skin and subcutaneous tissue: Secondary | ICD-10-CM | POA: Diagnosis not present

## 2024-02-02 DIAGNOSIS — L570 Actinic keratosis: Secondary | ICD-10-CM | POA: Diagnosis not present

## 2024-02-02 DIAGNOSIS — S70361A Insect bite (nonvenomous), right thigh, initial encounter: Secondary | ICD-10-CM | POA: Diagnosis not present

## 2024-02-02 DIAGNOSIS — L814 Other melanin hyperpigmentation: Secondary | ICD-10-CM | POA: Diagnosis not present

## 2024-02-11 DIAGNOSIS — M5411 Radiculopathy, occipito-atlanto-axial region: Secondary | ICD-10-CM | POA: Diagnosis not present

## 2024-02-11 DIAGNOSIS — M9901 Segmental and somatic dysfunction of cervical region: Secondary | ICD-10-CM | POA: Diagnosis not present

## 2024-02-24 ENCOUNTER — Other Ambulatory Visit: Payer: Self-pay | Admitting: Cardiology

## 2024-02-24 DIAGNOSIS — I4819 Other persistent atrial fibrillation: Secondary | ICD-10-CM

## 2024-03-15 DIAGNOSIS — M9901 Segmental and somatic dysfunction of cervical region: Secondary | ICD-10-CM | POA: Diagnosis not present

## 2024-03-15 DIAGNOSIS — M5411 Radiculopathy, occipito-atlanto-axial region: Secondary | ICD-10-CM | POA: Diagnosis not present

## 2024-03-23 ENCOUNTER — Ambulatory Visit: Admitting: Cardiology

## 2024-04-05 NOTE — Progress Notes (Unsigned)
  Electrophysiology Office Follow up Visit Note:    Date:  04/06/2024   ID:  Nichole Berg, DOB [birth year], MRN [id number]  PCP:  Charlett Apolinar POUR, MD  [facility] Cardiologist:  [name], MD  [facility] Electrophysiologist:  OLE ONEIDA HOLTS, MD    Interval History:    Nichole Berg is a 78 y.o. female who presents for a follow up visit.   She had a redo A-fib ablation on December 12, 2022.  I last saw her in 2024.  At that appointment she was doing well without recurrence of arrhythmia. Since that time she did have a cardioversion on September 03, 2023.  That was in the setting of a viral illness.  An appointment with Daril on September 16, 2023 she was in normal rhythm.  She takes Eliquis  for stroke prophylaxis  She is doing well today.  She tells me that immediately before she went back into A-fib several months ago she had intense pain in her leg.  The next thing she knew she was in atrial fibrillation.  She has not had an episode since her cardioversion.  She does describe some orthostatic hypotension.  She inquires about reducing her dose of metoprolol .     Past medical, surgical, social and family history were reviewed.  ROS:   Please see the history of present illness.    All other systems reviewed and are negative.  EKGs/Labs/Other Studies Reviewed:    The following studies were reviewed today:    EKG Interpretation Date/Time:  Wednesday April 06 2024 10:27:02 EDT Ventricular Rate:  53 PR Interval:  234 QRS Duration:  82 QT Interval:  482 QTC Calculation: 452 R Axis:   64  Text Interpretation: Sinus bradycardia with 1st degree A-V block Confirmed by HOLTS OLE [phone]) on 04/06/2024 10:32:01 AM    Physical Exam:    VS:  BP 127/79   Pulse (!) 53   Ht 5' 3.8 (1.621 m)   Wt 137 lb (62.1 kg)   SpO2 98%   BMI 23.66 kg/m     Wt Readings from Last 3 Encounters:  04/06/24 137 lb (62.1 kg)  09/16/23 135 lb (61.2 kg)  09/08/23 138 lb 6.4 oz (62.8 kg)     GEN:   Well nourished, well developed in no acute distress CARDIAC: RRR, no murmurs, rubs, gallops RESPIRATORY:  Clear to auscultation without rales, wheezing or rhonchi       ASSESSMENT:    1. Persistent atrial fibrillation (HCC)   2. Essential hypertension     PLAN:    In order of problems listed above:  #Persistent atrial fibrillation and flutter Doing well after her second catheter ablation in April 2024.   Maintaining sinus rhythm. Continue metoprolol  and Eliquis .  Reduce metoprolol  to 25 mg by mouth once daily  #Hypertension At goal today.  Recommend checking blood pressures 1-2 times per week at home and recording the values.  Recommend bringing these recordings to the primary care physician.   Follow-up 1 year with me       Signed, OLE HOLTS, MD, [facility], [facility] 04/06/2024 10:38 AM    Electrophysiology [facility]

## 2024-04-06 ENCOUNTER — Ambulatory Visit: Attending: Cardiology | Admitting: Cardiology

## 2024-04-06 VITALS — BP 127/79 | HR 53 | Ht 63.8 in | Wt 137.0 lb

## 2024-04-06 DIAGNOSIS — I10 Essential (primary) hypertension: Secondary | ICD-10-CM | POA: Diagnosis not present

## 2024-04-06 DIAGNOSIS — I4819 Other persistent atrial fibrillation: Secondary | ICD-10-CM

## 2024-04-06 MED ORDER — METOPROLOL SUCCINATE ER 25 MG PO TB24: 25.0000 mg | ORAL_TABLET | Freq: Every day | ORAL | 3 refills | Status: DC

## 2024-04-06 NOTE — Patient Instructions (Signed)
 Medication Instructions:  Your physician has recommended you make the following change in your medication:  1) DECREASE metoprolol  succinate to 25 mg once daily  *If you need a refill on your cardiac medications before your next appointment, please call your pharmacy*  Follow-Up: At [facility], you and your health needs are our priority.  As part of our continuing mission to provide you with exceptional heart care, our providers are all part of one team.  This team includes your primary Cardiologist (physician) and Advanced Practice Providers or APPs (Physician Assistants and Nurse Practitioners) who all work together to provide you with the care you need, when you need it.  Your next appointment:   1 year  Provider:   Ole Holts, MD

## 2024-04-21 ENCOUNTER — Encounter: Payer: Self-pay | Admitting: Cardiology

## 2024-05-10 ENCOUNTER — Ambulatory Visit (HOSPITAL_COMMUNITY)
Admission: RE | Admit: 2024-05-10 | Discharge: 2024-05-10 | Disposition: A | Discharge status: Home / Self Care | Source: Ambulatory Visit | Attending: Physician Assistant | Admitting: Physician Assistant

## 2024-05-10 ENCOUNTER — Encounter (HOSPITAL_COMMUNITY): Payer: Self-pay | Admitting: Physician Assistant

## 2024-05-10 VITALS — BP 116/80 | HR 117 | Ht 63.8 in | Wt 136.2 lb

## 2024-05-10 DIAGNOSIS — I4819 Other persistent atrial fibrillation: Secondary | ICD-10-CM

## 2024-05-10 DIAGNOSIS — D6869 Other thrombophilia: Secondary | ICD-10-CM | POA: Diagnosis not present

## 2024-05-10 DIAGNOSIS — I484 Atypical atrial flutter: Secondary | ICD-10-CM

## 2024-05-10 LAB — CBC
Hematocrit: 35.7 % (ref 34.0–46.6)
Hemoglobin: 11.4 g/dL (ref 11.1–15.9)
MCH: 29.9 pg (ref 26.6–33.0)
MCHC: 31.9 g/dL (ref 31.5–35.7)
MCV: 94 fL (ref 79–97)
Platelets: 228 x10E3/uL (ref 150–450)
RBC: 3.81 x10E6/uL (ref 3.77–5.28)
RDW: 12.9 % (ref 11.7–15.4)
WBC: 5.2 x10E3/uL (ref 3.4–10.8)

## 2024-05-10 LAB — BASIC METABOLIC PANEL WITH GFR
BUN/Creatinine Ratio: 19 (ref 12–28)
BUN: 14 mg/dL (ref 8–27)
CO2: 27 mmol/L (ref 20–29)
Calcium: 9.4 mg/dL (ref 8.7–10.3)
Chloride: 101 mmol/L (ref 96–106)
Creatinine, Ser: 0.75 mg/dL (ref 0.57–1.00)
Glucose: 85 mg/dL (ref 70–99)
Potassium: 4.1 mmol/L (ref 3.5–5.2)
Sodium: 139 mmol/L (ref 134–144)
eGFR: 80 mL/min/1.73 (ref >59)

## 2024-05-10 NOTE — H&P (View-Only) (Signed)
 Primary Care Physician: Charlett Apolinar POUR, MD Primary Cardiologist: Dr Jeffrie Primary Electrophysiologist: Dr Cindie Referring Physician: Dr Jeffrie [name] is a 78 y.o. female with a history of HTN, HLD, CAD, and atrial fibrillation who presents for follow up in the [facility]. The patient was initially diagnosed with atrial fibrillation 06/25/20 after presenting to urgent care with symptoms of dizziness and palpitations. Patient is on Eliquis  for a CHADS2VASC score of 5. She underwent DCCV on 08/15/20. Unfortunately, she felt she was back in afib with palpitations about 3 weeks later. Patient is s/p afib ablation with Dr Cindie on 12/10/20.   On 03/16/22 she noted an elevated heart rate with tachypalpitations. She was in atrial flutter and underwent DCCV on 04/10/22. She was seen by Dr Cindie on 05/02/22 and watchful waiting was pursued at that time. She had increased burden of atrial flutter and is s/p repeat ablation 12/12/22. Patient is s/p DCCV on 09/03/23.  Patient returns for follow up for atrial fibrillation. She noted an elevated HR on 04/20/24 while camping in [place]. She is in atrial flutter today. There were no specific triggers that she could identify. No bleeding issues on anticoagulation.   Today, she  denies symptoms of chest pain, shortness of breath, orthopnea, PND, lower extremity edema, dizziness, presyncope, syncope, snoring, daytime somnolence, bleeding, or neurologic sequela. The patient is tolerating medications without difficulties and is otherwise without complaint today.    Atrial Fibrillation Risk Factors:  she does not have symptoms or diagnosis of sleep apnea. she does not have a history of rheumatic fever. she does not have a history of alcohol use. The patient does have a history of early familial atrial fibrillation or other arrhythmias. Son has afib.   Atrial Fibrillation Management history:  Previous antiarrhythmic drugs:  none Previous cardioversions: 08/15/20, 04/10/22, 09/03/23 Previous ablations: 12/10/20, 11/04/22 Anticoagulation history: Eliquis    Past Medical History:  Diagnosis Date   Cataract 10/18/2011   Followed by opthalmology.    COLONIC POLYPS, HX OF 10/12/2009   COVID-19 03/2020   HYPERLIPIDEMIA 05/31/2007   NEPHROLITHIASIS, HX OF 05/31/2007    Current Outpatient Medications  Medication Sig Dispense Refill   acetaminophen  (TYLENOL ) 500 MG tablet Take 500 mg by mouth every 6 (six) hours as needed for moderate pain.     Cholecalciferol (VITAMIN D ) 2000 UNITS tablet Take 2,000 Units by mouth daily.     ELIQUIS  5 MG TABS tablet TAKE 1 TABLET TWICE DAILY 180 tablet 3   metoprolol  succinate (TOPROL  XL) 25 MG 24 hr tablet Take 1 tablet (25 mg total) by mouth daily. (Patient taking differently: Take 50 mg by mouth daily.) 90 tablet 3   Multiple Vitamins-Minerals (LUTEIN-ZEAXANTHIN PO) Take 1 tablet by mouth daily.     No current facility-administered medications for this encounter.    ROS- All systems are reviewed and negative except as per the HPI above.  Physical Exam: Vitals:   05/10/24 1516  BP: 116/80  Pulse: (!) 117  Weight: 61.8 kg  Height: 5' 3.8 (1.621 m)    GEN: Well nourished, well developed in no acute distress CARDIAC: Regular rate and rhythm, tachycardia, no murmurs, rubs, gallops RESPIRATORY:  Clear to auscultation without rales, wheezing or rhonchi  ABDOMEN: Soft, non-tender, non-distended EXTREMITIES:  No edema; No deformity    Wt Readings from Last 3 Encounters:  05/10/24 61.8 kg  04/06/24 62.1 kg  09/16/23 61.2 kg    EKG today demonstrates  Atypical atrial flutter  with 2:1 block Vent. rate 117 BPM PR interval * ms QRS duration 88 ms QT/QTcB 312/435 ms   Echo 08/10/20 demonstrated  1. Left ventricular ejection fraction, by estimation, is 60 to 65%. The  left ventricle has normal function. The left ventricle has no regional  wall motion abnormalities. Left  ventricular diastolic parameters are  indeterminate.   2. Right ventricular systolic function is normal. The right ventricular  size is normal. There is normal pulmonary artery systolic pressure.   3. Left atrial size was moderately dilated.   4. Right atrial size was mildly dilated.   5. The mitral valve is degenerative. Mild mitral valve regurgitation. No  evidence of mitral stenosis. Moderate mitral annular calcification.   6. Tricuspid valve regurgitation is mild to moderate.   7. The aortic valve is tricuspid. Aortic valve regurgitation is not  visualized. Mild to moderate aortic valve sclerosis/calcification is  present, without any evidence of aortic stenosis.   8. The inferior vena cava is normal in size with greater than 50%  respiratory variability, suggesting right atrial pressure of 3 mmHg.  Epic records are reviewed at length today   CHA2DS2-VASc Score = 5  The patient's score is based upon: CHF History: 0 HTN History: 1 Diabetes History: 0 Stroke History: 0 Vascular Disease History: 1 Age Score: 2 Gender Score: 1       ASSESSMENT AND PLAN: Persistent Atrial Fibrillation/atrial flutter (ICD10:  I48.19) The patient's CHA2DS2-VASc score is 5, indicating a 7.2% annual risk of stroke.   S/p afib ablation 12/10/20 and 12/12/22 She is in atrial flutter today with elevated rates. We discussed rhythm control options. Will arrange for DCCV. Check bmet/cbc. If she has quick return of her atrial arrhythmias, will consider dofetilide admission.  Continue Eliquis  5 mg BID Continue Toprol  50 mg daily  Secondary Hypercoagulable State (ICD10:  D68.69) The patient is at significant risk for stroke/thromboembolism based upon her CHA2DS2-VASc Score of 5.  Continue Apixaban  (Eliquis ). No bleeding issues.   HTN Stable on current regimen  CAD CAC score 366 No anginal symptoms Followed by Dr Jeffrie   Follow up with Dr Jeffrie as scheduled. AF clinic in 6-8 weeks.   Informed  Consent   Shared Decision Making/Informed Consent The risks (stroke, cardiac arrhythmias rarely resulting in the need for a temporary or permanent pacemaker, skin irritation or burns and complications associated with conscious sedation including aspiration, arrhythmia, respiratory failure and death), benefits (restoration of normal sinus rhythm) and alternatives of a direct current cardioversion were explained in detail to Ms. Trembley and she agrees to proceed.        Daril Kicks PA-C [facility] [address] [place], [place] [address] [phone] 05/10/2024 3:36 PM

## 2024-05-10 NOTE — Progress Notes (Signed)
 Primary Care Physician: Charlett Apolinar POUR, MD Primary Cardiologist: Dr Jeffrie Primary Electrophysiologist: Dr Cindie Referring Physician: Dr Jeffrie Nichole Berg is a 78 y.o. female with a history of HTN, HLD, CAD, and atrial fibrillation who presents for follow up in the [facility]. The patient was initially diagnosed with atrial fibrillation 06/25/20 after presenting to urgent care with symptoms of dizziness and palpitations. Patient is on Eliquis  for a CHADS2VASC score of 5. She underwent DCCV on 08/15/20. Unfortunately, she felt she was back in afib with palpitations about 3 weeks later. Patient is s/p afib ablation with Dr Cindie on 12/10/20.   On 03/16/22 she noted an elevated heart rate with tachypalpitations. She was in atrial flutter and underwent DCCV on 04/10/22. She was seen by Dr Cindie on 05/02/22 and watchful waiting was pursued at that time. She had increased burden of atrial flutter and is s/p repeat ablation 12/12/22. Patient is s/p DCCV on 09/03/23.  Patient returns for follow up for atrial fibrillation. She noted an elevated HR on 04/20/24 while camping in [place]. She is in atrial flutter today. There were no specific triggers that she could identify. No bleeding issues on anticoagulation.   Today, she  denies symptoms of chest pain, shortness of breath, orthopnea, PND, lower extremity edema, dizziness, presyncope, syncope, snoring, daytime somnolence, bleeding, or neurologic sequela. The patient is tolerating medications without difficulties and is otherwise without complaint today.    Atrial Fibrillation Risk Factors:  she does not have symptoms or diagnosis of sleep apnea. she does not have a history of rheumatic fever. she does not have a history of alcohol use. The patient does have a history of early familial atrial fibrillation or other arrhythmias. Son has afib.   Atrial Fibrillation Management history:  Previous antiarrhythmic drugs:  none Previous cardioversions: 08/15/20, 04/10/22, 09/03/23 Previous ablations: 12/10/20, 11/04/22 Anticoagulation history: Eliquis    Past Medical History:  Diagnosis Date   Cataract 10/18/2011   Followed by opthalmology.    COLONIC POLYPS, HX OF 10/12/2009   COVID-19 03/2020   HYPERLIPIDEMIA 05/31/2007   NEPHROLITHIASIS, HX OF 05/31/2007    Current Outpatient Medications  Medication Sig Dispense Refill   acetaminophen  (TYLENOL ) 500 MG tablet Take 500 mg by mouth every 6 (six) hours as needed for moderate pain.     Cholecalciferol (VITAMIN D ) 2000 UNITS tablet Take 2,000 Units by mouth daily.     ELIQUIS  5 MG TABS tablet TAKE 1 TABLET TWICE DAILY 180 tablet 3   metoprolol  succinate (TOPROL  XL) 25 MG 24 hr tablet Take 1 tablet (25 mg total) by mouth daily. (Patient taking differently: Take 50 mg by mouth daily.) 90 tablet 3   Multiple Vitamins-Minerals (LUTEIN-ZEAXANTHIN PO) Take 1 tablet by mouth daily.     No current facility-administered medications for this encounter.    ROS- All systems are reviewed and negative except as per the HPI above.  Physical Exam: Vitals:   05/10/24 1516  BP: 116/80  Pulse: (!) 117  Weight: 61.8 kg  Height: 5' 3.8 (1.621 m)    GEN: Well nourished, well developed in no acute distress CARDIAC: Regular rate and rhythm, tachycardia, no murmurs, rubs, gallops RESPIRATORY:  Clear to auscultation without rales, wheezing or rhonchi  ABDOMEN: Soft, non-tender, non-distended EXTREMITIES:  No edema; No deformity    Wt Readings from Last 3 Encounters:  05/10/24 61.8 kg  04/06/24 62.1 kg  09/16/23 61.2 kg    EKG today demonstrates  Atypical atrial flutter  with 2:1 block Vent. rate 117 BPM PR interval * ms QRS duration 88 ms QT/QTcB 312/435 ms   Echo 08/10/20 demonstrated  1. Left ventricular ejection fraction, by estimation, is 60 to 65%. The  left ventricle has normal function. The left ventricle has no regional  wall motion abnormalities. Left  ventricular diastolic parameters are  indeterminate.   2. Right ventricular systolic function is normal. The right ventricular  size is normal. There is normal pulmonary artery systolic pressure.   3. Left atrial size was moderately dilated.   4. Right atrial size was mildly dilated.   5. The mitral valve is degenerative. Mild mitral valve regurgitation. No  evidence of mitral stenosis. Moderate mitral annular calcification.   6. Tricuspid valve regurgitation is mild to moderate.   7. The aortic valve is tricuspid. Aortic valve regurgitation is not  visualized. Mild to moderate aortic valve sclerosis/calcification is  present, without any evidence of aortic stenosis.   8. The inferior vena cava is normal in size with greater than 50%  respiratory variability, suggesting right atrial pressure of 3 mmHg.  Epic records are reviewed at length today   CHA2DS2-VASc Score = 5  The patient's score is based upon: CHF History: 0 HTN History: 1 Diabetes History: 0 Stroke History: 0 Vascular Disease History: 1 Age Score: 2 Gender Score: 1       ASSESSMENT AND PLAN: Persistent Atrial Fibrillation/atrial flutter (ICD10:  I48.19) The patient's CHA2DS2-VASc score is 5, indicating a 7.2% annual risk of stroke.   S/p afib ablation 12/10/20 and 12/12/22 She is in atrial flutter today with elevated rates. We discussed rhythm control options. Will arrange for DCCV. Check bmet/cbc. If she has quick return of her atrial arrhythmias, will consider dofetilide admission.  Continue Eliquis  5 mg BID Continue Toprol  50 mg daily  Secondary Hypercoagulable State (ICD10:  D68.69) The patient is at significant risk for stroke/thromboembolism based upon her CHA2DS2-VASc Score of 5.  Continue Apixaban  (Eliquis ). No bleeding issues.   HTN Stable on current regimen  CAD CAC score 366 No anginal symptoms Followed by Dr Jeffrie   Follow up with Dr Jeffrie as scheduled. AF clinic in 6-8 weeks.   Informed  Consent   Shared Decision Making/Informed Consent The risks (stroke, cardiac arrhythmias rarely resulting in the need for a temporary or permanent pacemaker, skin irritation or burns and complications associated with conscious sedation including aspiration, arrhythmia, respiratory failure and death), benefits (restoration of normal sinus rhythm) and alternatives of a direct current cardioversion were explained in detail to Nichole Berg and she agrees to proceed.        Daril Kicks PA-C [facility] [address] [place], [place] [address] [phone] 05/10/2024 3:36 PM

## 2024-05-10 NOTE — Patient Instructions (Addendum)
 Cardioversion scheduled for: Monday, September 8th   - Arrive at the [employer] [facility] ([address])  and check in with ADMITTING at 7:30 AM   - Do not eat or drink anything after midnight the night prior to your procedure.   - Take all your morning medication (except diabetic medications) with a sip of water prior to arrival.  - Do NOT miss any doses of your blood thinner - if you should miss a dose or take a dose more than 4 hours late -- please notify our office immediately.  - You will not be able to drive home after your procedure. Please ensure you have a responsible adult to drive you home. You will need someone with you for 24 hours post procedure.     - Expect to be in the procedural area approximately 2 hours.   - If you feel as if you go back into normal rhythm prior to scheduled cardioversion, please notify our office immediately.   If your procedure is canceled in the cardioversion suite you will be charged a cancellation fee.

## 2024-05-11 ENCOUNTER — Ambulatory Visit (HOSPITAL_COMMUNITY): Payer: Self-pay | Admitting: Physician Assistant

## 2024-05-11 DIAGNOSIS — Z961 Presence of intraocular lens: Secondary | ICD-10-CM | POA: Diagnosis not present

## 2024-05-11 DIAGNOSIS — H04123 Dry eye syndrome of bilateral lacrimal glands: Secondary | ICD-10-CM | POA: Diagnosis not present

## 2024-05-11 DIAGNOSIS — H5212 Myopia, left eye: Secondary | ICD-10-CM | POA: Diagnosis not present

## 2024-05-13 NOTE — Progress Notes (Signed)
 Left detailed VM for  patient and instructed them to come at 0700  and to be NPO after 0000.  Medications reviewed and patient instructed to take Eliquis .   Advised that patient have a ride home and someone to stay with them for 24 hours after the procedure.   Advised  no breaks in taking blood thinner for 3+ weeks prior to procedure.  Left number to call back if they have additional questions.

## 2024-05-15 NOTE — Anesthesia Preprocedure Evaluation (Signed)
 Anesthesia Evaluation  Patient identified by MRN, date of birth, ID band Patient awake    Reviewed: Allergy & Precautions, H&P , NPO status , Patient's Chart, lab work & pertinent test results  Airway Mallampati: II  TM Distance: >3 FB Neck ROM: Full    Dental no notable dental hx. (+) Teeth Intact, Dental Advisory Given   Pulmonary neg pulmonary ROS, former smoker   Pulmonary exam normal breath sounds clear to auscultation       Cardiovascular Exercise Tolerance: Good + dysrhythmias Atrial Fibrillation  Rhythm:Irregular Rate:Tachycardia     Neuro/Psych negative neurological ROS  negative psych ROS   GI/Hepatic negative GI ROS, Neg liver ROS,,,  Endo/Other  negative endocrine ROS    Renal/GU negative Renal ROS  negative genitourinary   Musculoskeletal  (+) Arthritis , Osteoarthritis,    Abdominal   Peds  Hematology negative hematology ROS (+)   Anesthesia Other Findings   Reproductive/Obstetrics negative OB ROS                              Anesthesia Physical Anesthesia Plan  ASA: 3  Anesthesia Plan: General   Post-op Pain Management: Minimal or no pain anticipated   Induction: Intravenous  PONV Risk Score and Plan: 3 and Propofol  infusion and Treatment may vary due to age or medical condition  Airway Management Planned: Natural Airway and Nasal Cannula  Additional Equipment:   Intra-op Plan:   Post-operative Plan:   Informed Consent: I have reviewed the patients History and Physical, chart, labs and discussed the procedure including the risks, benefits and alternatives for the proposed anesthesia with the patient or authorized representative who has indicated his/her understanding and acceptance.     Dental advisory given  Plan Discussed with: CRNA  Anesthesia Plan Comments:          Anesthesia Quick Evaluation

## 2024-05-16 ENCOUNTER — Ambulatory Visit (HOSPITAL_BASED_OUTPATIENT_CLINIC_OR_DEPARTMENT_OTHER): Payer: Self-pay | Admitting: Anesthesiology

## 2024-05-16 ENCOUNTER — Encounter (HOSPITAL_COMMUNITY): Payer: Self-pay | Admitting: Cardiology

## 2024-05-16 ENCOUNTER — Ambulatory Visit (HOSPITAL_COMMUNITY): Payer: Self-pay | Admitting: Anesthesiology

## 2024-05-16 ENCOUNTER — Encounter (HOSPITAL_COMMUNITY)
Admission: RE | Disposition: A | Discharge status: Home / Self Care | Payer: Self-pay | Source: Home / Self Care | Attending: Cardiology

## 2024-05-16 ENCOUNTER — Ambulatory Visit (HOSPITAL_COMMUNITY)
Admission: RE | Admit: 2024-05-16 | Discharge: 2024-05-16 | Disposition: A | Discharge status: Home / Self Care | Attending: Cardiology | Admitting: Cardiology

## 2024-05-16 ENCOUNTER — Other Ambulatory Visit: Payer: Self-pay

## 2024-05-16 DIAGNOSIS — Z87891 Personal history of nicotine dependence: Secondary | ICD-10-CM | POA: Insufficient documentation

## 2024-05-16 DIAGNOSIS — Z79899 Other long term (current) drug therapy: Secondary | ICD-10-CM | POA: Insufficient documentation

## 2024-05-16 DIAGNOSIS — I4819 Other persistent atrial fibrillation: Secondary | ICD-10-CM | POA: Diagnosis not present

## 2024-05-16 DIAGNOSIS — I10 Essential (primary) hypertension: Secondary | ICD-10-CM | POA: Insufficient documentation

## 2024-05-16 DIAGNOSIS — I4892 Unspecified atrial flutter: Secondary | ICD-10-CM | POA: Insufficient documentation

## 2024-05-16 DIAGNOSIS — E785 Hyperlipidemia, unspecified: Secondary | ICD-10-CM | POA: Diagnosis not present

## 2024-05-16 DIAGNOSIS — D6869 Other thrombophilia: Secondary | ICD-10-CM | POA: Diagnosis not present

## 2024-05-16 DIAGNOSIS — Z7901 Long term (current) use of anticoagulants: Secondary | ICD-10-CM | POA: Diagnosis not present

## 2024-05-16 DIAGNOSIS — I2510 Atherosclerotic heart disease of native coronary artery without angina pectoris: Secondary | ICD-10-CM | POA: Insufficient documentation

## 2024-05-16 HISTORY — PX: CARDIOVERSION: EP1203

## 2024-05-16 SURGERY — CARDIOVERSION (CATH LAB)
Anesthesia: General

## 2024-05-16 MED ORDER — EPHEDRINE SULFATE (PRESSORS) 50 MG/ML IJ SOLN
INTRAMUSCULAR | Status: DC | PRN
Start: 2024-05-16 — End: 2024-05-16
  Administered 2024-05-16: 10 mg via INTRAVENOUS

## 2024-05-16 MED ORDER — SODIUM CHLORIDE 0.9 % IV SOLN: INTRAVENOUS | Status: DC

## 2024-05-16 MED ORDER — PROPOFOL 10 MG/ML IV BOLUS
INTRAVENOUS | Status: DC | PRN
  Administered 2024-05-16: 60 mg via INTRAVENOUS

## 2024-05-16 MED ORDER — SODIUM CHLORIDE 0.9% FLUSH
INTRAVENOUS | Status: DC | PRN
  Administered 2024-05-16: 5 mL via INTRAVENOUS
  Administered 2024-05-16: 3 mL via INTRAVENOUS

## 2024-05-16 MED ORDER — LIDOCAINE 2% (20 MG/ML) 5 ML SYRINGE
INTRAMUSCULAR | Status: DC | PRN
  Administered 2024-05-16: 50 mg via INTRAVENOUS

## 2024-05-16 SURGICAL SUPPLY — 1 items: PAD DEFIB RADIO PHYSIO CONN (PAD) ×1 IMPLANT

## 2024-05-16 NOTE — Anesthesia Postprocedure Evaluation (Signed)
 Anesthesia Post Note  Patient: Nichole Berg  Procedure(s) Performed: CARDIOVERSION     Patient location during evaluation: Cath Lab Anesthesia Type: General Level of consciousness: awake and alert Pain management: pain level controlled Vital Signs Assessment: post-procedure vital signs reviewed and stable Respiratory status: spontaneous breathing, nonlabored ventilation and respiratory function stable Cardiovascular status: blood pressure returned to baseline and stable Postop Assessment: no apparent nausea or vomiting Anesthetic complications: no   No notable events documented.  Last Vitals:  Vitals:   05/16/24 0833 05/16/24 0838  BP: 96/70 109/70  Pulse: 60 60  Resp: 17 19  Temp:    SpO2: 97% 99%    Last Pain:  Vitals:   05/16/24 0838  TempSrc:   PainSc: 0-No pain                 Marne Meline,W. EDMOND

## 2024-05-16 NOTE — Discharge Instructions (Signed)

## 2024-05-16 NOTE — CV Procedure (Signed)
    Electrical Cardioversion Procedure Note Nichole Berg [id number] Feb 12, 1946  Procedure: Electrical Cardioversion Indications:  Atrial Flutter  Time Out: Verified patient identification, verified procedure,medications/allergies/relevent history reviewed, required imaging and test results available.  Performed  Procedure Details  The patient was NPO after midnight. Anesthesia was administered at the beside  by Dr.Fitzgerald with 60mg  of propofol .  Cardioversion was performed with synchronized biphasic defibrillation via AP pads with 200 joules.  1 attempt(s) were performed.  The patient converted to normal sinus rhythm. The patient tolerated the procedure well   IMPRESSION:  Successful cardioversion of atrial flutter   Nichole Berg 05/16/2024, 8:28 AM

## 2024-05-16 NOTE — Interval H&P Note (Signed)
 History and Physical Interval Note:  05/16/2024 8:10 AM  Nichole Berg  has presented today for surgery, with the diagnosis of afib.  The various methods of treatment have been discussed with the patient and family. After consideration of risks, benefits and other options for treatment, the patient has consented to  Procedure(s): CARDIOVERSION (N/A) as a surgical intervention.  The patient's history has been reviewed, patient examined, no change in status, stable for surgery.  I have reviewed the patient's chart and labs.  Questions were answered to the patient's satisfaction.     [employer]

## 2024-05-16 NOTE — Transfer of Care (Signed)
 Immediate Anesthesia Transfer of Care Note  Patient: Nichole Berg  Procedure(s) Performed: CARDIOVERSION  Patient Location: Cath Lab  Anesthesia Type:General  Level of Consciousness: sedated, drowsy, and responds to stimulation  Airway & Oxygen Therapy: Patient Spontanous Breathing and Patient connected to nasal cannula oxygen  Post-op Assessment: Report given to RN and Post -op Vital signs reviewed and stable  Post vital signs: Reviewed and stable  Last Vitals:  Vitals Value Taken Time  BP 91/60 08:29  Temp    Pulse 52   Resp 16   SpO2 98     Last Pain:  Vitals:   05/16/24 0759  TempSrc:   PainSc: 0-No pain         Complications: No notable events documented.

## 2024-05-20 ENCOUNTER — Encounter: Payer: Self-pay | Admitting: Cardiology

## 2024-05-20 ENCOUNTER — Ambulatory Visit: Attending: Cardiology | Admitting: Cardiology

## 2024-05-20 VITALS — BP 109/55 | HR 63 | Ht 63.8 in | Wt 133.0 lb

## 2024-05-20 DIAGNOSIS — Z789 Other specified health status: Secondary | ICD-10-CM | POA: Diagnosis not present

## 2024-05-20 DIAGNOSIS — I484 Atypical atrial flutter: Secondary | ICD-10-CM | POA: Diagnosis not present

## 2024-05-20 DIAGNOSIS — I4819 Other persistent atrial fibrillation: Secondary | ICD-10-CM

## 2024-05-20 DIAGNOSIS — I2510 Atherosclerotic heart disease of native coronary artery without angina pectoris: Secondary | ICD-10-CM

## 2024-05-20 NOTE — Progress Notes (Signed)
 Cardiology Office Note:  .   Date:  05/20/2024  ID:  Nichole Berg, DOB [birth year], MRN [id number] PCP: Charlett Apolinar POUR, MD  [facility] Providers Cardiologist:  [name], MD Electrophysiologist:  OLE ONEIDA HOLTS, MD    History of Present Illness: .   Nichole Berg is a 78 y.o. female Discussed the use of AI scribe software for clinical note transcription with the patient, who gave verbal consent to proceed.  History of Present Illness Nichole Berg is an 78 year old female with atrial fibrillation who presents for follow-up after recent cardioversion.  She underwent a successful cardioversion on May 16, 2024, following an episode of elevated heart rate while camping in [place]  on April 20, 2024. Her heart rate during episodes can rise to 125-130 bpm but typically returns to normal. At night, her heart rate is in the forties and fifties, consistent with her baseline prior to the recent episode.  She has a history of atrial fibrillation with previous ablations on December 10, 2020, and in 2024, as well as prior cardioversions on September 03, 2023, and June 25, 2020. No specific triggers for her atrial fibrillation have been identified.  She has known coronary artery disease, hypertension, and hyperlipidemia. Her echocardiogram shows an ejection fraction of 60-65%, and her coronary calcium  score is 386. She is statin intolerant due to past difficulties with pravastatin  and rosuvastatin .  She is currently taking Eliquis  5 mg twice a day and metoprolol  50 mg daily. She previously tried reducing metoprolol  to 25 mg but experienced adverse effects and returned to the 50 mg dose. Her CHA2DS2-VASc score is 5. She feels 'lucky' to not have many symptoms and is able to maintain her usual activities.      ROS:   Studies Reviewed: .        Results DIAGNOSTIC Echocardiogram: Ejection Fraction (EF) 60-65% Coronary calcium  score: 386 Risk Assessment/Calculations:            Physical  Exam:   VS:  BP (!) 109/55   Pulse 63   Ht 5' 3.8 (1.621 m)   Wt 133 lb (60.3 kg)   SpO2 95%   BMI 22.97 kg/m    Wt Readings from Last 3 Encounters:  05/20/24 133 lb (60.3 kg)  05/10/24 136 lb 3.2 oz (61.8 kg)  04/06/24 137 lb (62.1 kg)    GEN: Well nourished, well developed in no acute distress NECK: No JVD; No carotid bruits CARDIAC: RRR, no murmurs, no rubs, no gallops RESPIRATORY:  Clear to auscultation without rales, wheezing or rhonchi  ABDOMEN: Soft, non-tender, non-distended EXTREMITIES:  No edema; No deformity   ASSESSMENT AND PLAN: .    Assessment and Plan Assessment & Plan Atrial fibrillation status post cardioversion and ablation Atrial fibrillation with two prior ablations and multiple cardioversions, most recently on May 16, 2024. Currently in sinus rhythm with elevated heart rates during physical activity, normalizing at rest. Possible atrial flutter from a different area than the atrial fibrillation. Atrial fibrosis and scarring may contribute to recurrent arrhythmias. - Continue Eliquis  5 mg twice daily - Continue metoprolol  50 mg daily - Consider antiarrhythmic therapy with Tikosyn or Multaq if recurrent episodes occur, acknowledging hospitalization requirement with Tikosyn and potential side effects with both medications - Repeat cardioversion if necessary, not more frequently than annually  Coronary artery disease Known coronary artery disease with a coronary calcium  score of 386.  Not interested in statin therapy  Hypertension Hypertension managed with metoprolol . - Continue metoprolol  50 mg  daily  Hyperlipidemia with statin intolerance Hyperlipidemia with statin intolerance to pravastatin  and rosuvastatin .         Dispo: 1 yr  Signed, [name], MD

## 2024-05-20 NOTE — Patient Instructions (Signed)

## 2024-06-16 ENCOUNTER — Other Ambulatory Visit: Payer: Self-pay | Admitting: Medical Genetics

## 2024-06-16 ENCOUNTER — Ambulatory Visit: Admitting: Family Medicine

## 2024-06-16 DIAGNOSIS — Z0000 Encounter for general adult medical examination without abnormal findings: Secondary | ICD-10-CM | POA: Diagnosis not present

## 2024-06-16 NOTE — Progress Notes (Signed)
 PATIENT CHECK-IN and HEALTH RISK ASSESSMENT QUESTIONNAIRE:  -completed by phone/video for upcoming Medicare Preventive Visit  Pre-Visit Check-in: 1)Vitals (height, wt, BP, etc) - record in vitals section for visit on day of visit Request home vitals (wt, BP, etc.) and enter into vitals, THEN update Vital Signs SmartPhrase below at the top of the HPI. See below.  2)Review and Update Medications, Allergies PMH, Surgeries, Social history in Epic 3)Hospitalizations in the last year with date/reason? y  4)Review and Update Care Team (patient's specialists) in Epic 5) Complete PHQ9 in Epic  6) Complete Fall Screening in Epic 7)Review all Health Maintenance Due and order if not done.  Medicare Wellness Patient Questionnaire:  Answer theses question about your habits: How often do you have a drink containing alcohol?n How many drinks containing alcohol do you have on a typical day when you are drinking?na How often do you have six or more drinks on one occasion?na Have you ever smoked?y Quit date if applicable? Over 40 years ago  How many packs a day do/did you smoke? For two years only Do you use smokeless tobacco?n Do you use an illicit drugs?n On average, how many days per week do you engage in moderate to strenuous exercise (like a brisk walk)?daily On average, how many minutes do you engage in exercise at this level?5 miles a day - walks with the dog, in addition plays golf once a week and does water aerobic 3 days ago week Typical breakfast: eggs or yogurt with walnuts, fruit Typical lunch: doesn't usually eat lunch Typical dinner: protein, veggies, salad  Beverages: water, sometimes seltzer, decaf hot tea, decaf coffee  Answer theses question about your everyday activities: Can you perform most household chores?y Are you deaf or have significant trouble hearing?n Do you feel that you have a problem with memory?n Do you feel safe at home?y Last dentist visit?every 6 months 8. Do  you have any difficulty performing your everyday activities?n Are you having any difficulty walking, taking medications on your own, and or difficulty managing daily home needs?n Do you have difficulty walking or climbing stairs?n Do you have difficulty dressing or bathing?n Do you have difficulty doing errands alone such as visiting a doctor's office or shopping?n Do you currently have any difficulty preparing food and eating?n Do you currently have any difficulty using the toilet?n Do you have any difficulty managing your finances?n Do you have any difficulties with housekeeping of managing your housekeeping?n   Do you have Advanced Directives in place (Living Will, Healthcare Power or Attorney)? y   Last eye Exam and location?seeing Dr. Juana, had visit last month   Do you currently use prescribed or non-prescribed narcotic or opioid pain medications?n  Do you have a history or close family history of breast, ovarian, tubal or peritoneal cancer or a family member with BRCA (breast cancer susceptibility 1 and 2) gene mutations? See FH/PMH      ----------------------------------------------------------------------------------------------------------------------------------------------------------------------------------------------------------------------  Because this visit was a virtual/telehealth visit, some criteria may be missing or patient reported. Any vitals not documented were not able to be obtained and vitals that have been documented are patient reported.    MEDICARE ANNUAL PREVENTIVE VISIT WITH PROVIDER: (Welcome to [facility], initial annual wellness or annual wellness exam)  Virtual Visit via Video Note  I connected with Nichole Berg on 06/16/24 by a video enabled telemedicine application and verified that I am speaking with the correct person using two identifiers.  Location patient: home Location provider:work or home office Persons participating  in the virtual  visit: patient, provider  Concerns and/or follow up today: has had some cardiac issues - A. Fib/ Aflutter- seeing cardiology for this. Had cardioversion - doing well now.    See HM section in Epic for other details of completed HM.    ROS: negative for report of fevers, unintentional weight loss, vision changes, vision loss, hearing loss or change, chest pain, sob, hemoptysis, melena, hematochezia, hematuria, falls, bleeding or bruising, thoughts of suicide or self harm, memory loss  Patient-completed extensive health risk assessment - reviewed and discussed with the patient: See Health Risk Assessment completed with patient prior to the visit either above or in recent phone note. This was reviewed in detailed with the patient today and appropriate recommendations, orders and referrals were placed as needed per Summary below and patient instructions.   Review of Medical History: -PMH, PSH, Family History and current specialty and care providers reviewed and updated and listed below   Patient Care Team: Panosh, Apolinar POUR, MD as PCP - General Jeffrie Oneil BROCKS, MD as PCP - Cardiology (Cardiology) Cindie Ole DASEN, MD as PCP - Electrophysiology (Cardiology) Dr. Juana as Attending Physician (Ophthalmology)   Past Medical History:  Diagnosis Date   Cataract 10/18/2011   Followed by opthalmology.    COLONIC POLYPS, HX OF 10/12/2009   COVID-19 03/2020   HYPERLIPIDEMIA 05/31/2007   NEPHROLITHIASIS, HX OF 05/31/2007    Past Surgical History:  Procedure Laterality Date   ATRIAL FIBRILLATION ABLATION N/A 12/10/2020   Procedure: ATRIAL FIBRILLATION ABLATION;  Surgeon: Cindie Ole DASEN, MD;  Location: [facility] INVASIVE CV LAB;  Service: Cardiovascular;  Laterality: N/A;   ATRIAL FIBRILLATION ABLATION N/A 12/12/2022   Procedure: ATRIAL FIBRILLATION ABLATION;  Surgeon: Cindie Ole DASEN, MD;  Location: [facility] INVASIVE CV LAB;  Service: Cardiovascular;  Laterality: N/A;   CARDIOVERSION N/A 08/15/2020   Procedure:  CARDIOVERSION;  Surgeon: Hobart Powell BRAVO, MD;  Location: [facility] ENDOSCOPY;  Service: Cardiovascular;  Laterality: N/A;   CARDIOVERSION N/A 04/10/2022   Procedure: CARDIOVERSION;  Surgeon: Santo Stanly LABOR, MD;  Location: [facility] ENDOSCOPY;  Service: Cardiovascular;  Laterality: N/A;   CARDIOVERSION N/A 09/03/2023   Procedure: CARDIOVERSION;  Surgeon: Okey Vina GAILS, MD;  Location: [facility] INVASIVE CV LAB;  Service: Cardiovascular;  Laterality: N/A;   CARDIOVERSION N/A 05/16/2024   Procedure: CARDIOVERSION;  Surgeon: Jeffrie Oneil BROCKS, MD;  Location: [facility] INVASIVE CV LAB;  Service: Cardiovascular;  Laterality: N/A;   CATARACT EXTRACTION, BILATERAL      Social History   Socioeconomic History   Marital status: Single    Spouse name: Not on file   Number of children: Not on file   Years of education: Not on file   Highest education level: Not on file  Occupational History   Occupation: Retired  Tobacco Use   Smoking status: Former    Current packs/day: 0.00    Types: Cigarettes    Start date: 09/08/1962    Quit date: 09/08/1964    Years since quitting: 59.8   Smokeless tobacco: Never   Tobacco comments:    Former smoker 03/18/22  Vaping Use   Vaping status: Never Used  Substance and Sexual Activity   Alcohol use: Yes    Comment: occassional   Drug use: No   Sexual activity: Not on file  Other Topics Concern   Not on file  Social History Narrative   Retired Publishing rights manager    Non smoker   Works as Engineer, 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  H hof 2 1 cat neg tob      Social Drivers of Corporate investment banker Strain: Low Risk  (10/29/2022)   Overall Financial Resource Strain (CARDIA)    Difficulty of Paying Living Expenses: Not hard at all  Food Insecurity: No Food Insecurity (10/29/2022)   Hunger Vital Sign    Worried About Running Out of Food in the Last Year: Never true    Ran Out of Food in the Last Year: Never true  Transportation Needs: No Transportation Needs (10/29/2022)    PRAPARE - Administrator, Civil Service (Medical): No    Lack of Transportation (Non-Medical): No  Physical Activity: Sufficiently Active (09/08/2023)   Exercise Vital Sign    Days of Exercise per Week: 7 days    Minutes of Exercise per Session: 30 min  Stress: Stress Concern Present (09/08/2023)   [employer] of Occupational Health - Occupational Stress Questionnaire    Feeling of Stress : To some extent  Social Connections: Moderately Isolated (10/29/2022)   Social Connection and Isolation Panel    Frequency of Communication with Friends and Family: More than three times a week    Frequency of Social Gatherings with Friends and Family: More than three times a week    Attends Religious Services: Never    Database administrator or Organizations: No    Attends Banker Meetings: Never    Marital Status: Living with partner  Intimate Partner Violence: Not At Risk (10/29/2022)   Humiliation, Afraid, Rape, and Kick questionnaire    Fear of Current or Ex-Partner: No    Emotionally Abused: No    Physically Abused: No    Sexually Abused: No    Family History  Problem Relation Age of Onset   Atrial fibrillation Mother        Has pacer doing very well age 75 passed suddnely age 83 poss CV    Current Outpatient Medications on File Prior to Visit  Medication Sig Dispense Refill   acetaminophen  (TYLENOL ) 500 MG tablet Take 500 mg by mouth every 6 (six) hours as needed for moderate pain.     carboxymethylcellulose (REFRESH PLUS) 0.5 % SOLN 1-2 drops 3 (three) times daily as needed (dry/irritated eyes.).     Cholecalciferol (VITAMIN D ) 2000 UNITS tablet Take 2,000 Units by mouth in the morning.     ELIQUIS  5 MG TABS tablet TAKE 1 TABLET TWICE DAILY 180 tablet 3   metoprolol  succinate (TOPROL -XL) 50 MG 24 hr tablet Take 50 mg by mouth in the morning. Take with or immediately following a meal.     Multiple Vitamin (MULTIVITAMIN WITH MINERALS) TABS tablet Take 1  tablet by mouth 2 (two) times a week.     Multiple Vitamins-Minerals (LUTEIN-ZEAXANTHIN PO) Take 1 tablet by mouth in the morning.     No current facility-administered medications on file prior to visit.    Allergies  Allergen Reactions   Iodinated Contrast Media Hives and Itching   Pravastatin      Headache on 40mg  daily dosing. Tolerates 10 and 20mg    Rosuvastatin      rosuvastatin  5mg  3x weekly and daily - headaches and myalgias   Sulfonamide Derivatives Itching and Rash       Physical Exam Vitals requested from patient and listed below if patient had equipment and was able to obtain at home for this virtual visit: There were no vitals filed for this visit. Estimated body mass index is 22.97 kg/m as calculated  from the following:   Height as of 05/20/24: 5' 3.8 (1.621 m).   Weight as of 05/20/24: 133 lb (60.3 kg).  EKG (optional): deferred due to virtual visit  GENERAL: alert, oriented, no acute distress detected, full vision exam deferred due to pandemic and/or virtual encounter  HEENT: atraumatic, conjunttiva clear, no obvious abnormalities on inspection of external nose and ears  NECK: normal movements of the head and neck  LUNGS: on inspection no signs of respiratory distress, breathing rate appears normal, no obvious gross SOB, gasping or wheezing  CV: no obvious cyanosis  MS: moves all visible extremities without noticeable abnormality  PSYCH/NEURO: pleasant and cooperative, no obvious depression or anxiety, speech and thought processing grossly intact, Cognitive function grossly intact  Flowsheet Row Clinical Support from 10/29/2022 in [facility] [place]  PHQ-9 Total Score 0        06/16/2024    4:26 PM 10/29/2022   10:03 AM 09/02/2022    8:58 AM 08/26/2021    1:39 PM 05/06/2021    2:43 PM  Depression screen PHQ 2/9  Decreased Interest 0 0 0 0 0  Down, Depressed, Hopeless 0 0 0 0 0  PHQ - 2 Score 0 0 0 0 0  Altered sleeping  0 1 0    Tired, decreased energy  0 0 0   Change in appetite  0 0 0   Feeling bad or failure about yourself   0 0 0   Trouble concentrating  0 0 0   Moving slowly or fidgety/restless  0 0 0   Suicidal thoughts  0 0 0   PHQ-9 Score  0 1 0   Difficult doing work/chores  Not difficult at all Not difficult at all         04/10/2022    9:59 AM 09/02/2022    8:18 AM 10/27/2022   11:01 AM 10/29/2022   10:04 AM 06/16/2024    4:26 PM  Fall Risk  Falls in the past year?  0 0 0 0  Was there an injury with Fall?  0 0 0 0  Fall Risk Category Calculator  0 0 0 0  Fall Risk Category (Retired)  Low      (RETIRED) Patient Fall Risk Level Low fall risk  Low fall risk      Patient at Risk for Falls Due to  No Fall Risks  No Fall Risks   Fall risk Follow up  Falls evaluation completed   Falls prevention discussed Falls evaluation completed     Data saved with a previous flowsheet row definition     SUMMARY AND PLAN:  Encounter for Medicare annual wellness exam  Discussed applicable health maintenance/preventive health measures and advised and referred or ordered per patient preferences: -discussed vaccines due recs and risks, she plans to get the flu shot and is not sure about the covid vaccine Health Maintenance  Topic Date Due   Influenza Vaccine  04/08/2024   COVID-19 Vaccine (4 - 2025-26 season) 07/02/2024 (Originally 05/09/2024)   Medicare Annual Wellness (AWV)  06/16/2025   DTaP/Tdap/Td (4 - Td or Tdap) 06/05/2031   Pneumococcal Vaccine: 50+ Years  Completed   DEXA SCAN  Completed   Zoster Vaccines- Shingrix  Completed   Meningococcal B Vaccine  Aged Out   Colonoscopy  Discontinued   Hepatitis C Screening  Discontinued      Education and counseling on the following was provided based on the above review of health and  a plan/checklist for the patient, along with additional information discussed, was provided for the patient in the patient instructions :  -Advised and counseled on a healthy  lifestyle - including the importance of a healthy diet, regular physical activity, social connections -Reviewed patient's current diet. Advised and counseled on a whole foods based healthy diet. A summary of a healthy diet was provided in the Patient Instructions.  -reviewed patient's current physical activity level and discussed exercise guidelines for adults. Congratulated on healthy habits and further resources provided in patient instructions.  -Advise yearly dental visits at minimum and regular eye exams   Follow up: see patient instructions     Patient Instructions  I really enjoyed getting to talk with you today! I am available on Tuesdays and Thursdays for virtual visits if you have any questions or concerns, or if I can be of any further assistance.   CHECKLIST FROM ANNUAL WELLNESS VISIT:  -Follow up (please call to schedule if not scheduled after visit):   -yearly for annual wellness visit with primary care office  Here is a list of your preventive care/health maintenance measures and the plan for each if any are due:  PLAN For any measures below that may be due:    1. Can get vaccines at the pharmacy - please let us  know if you do so that we can update your records.   Health Maintenance  Topic Date Due   Medicare Annual Wellness (AWV)  10/30/2023   Influenza Vaccine  04/08/2024   COVID-19 Vaccine (4 - 2025-26 season) 05/09/2024   DTaP/Tdap/Td (4 - Td or Tdap) 06/05/2031   Pneumococcal Vaccine: 50+ Years  Completed   DEXA SCAN  Completed   Zoster Vaccines- Shingrix  Completed   Meningococcal B Vaccine  Aged Out   Colonoscopy  Discontinued   Hepatitis C Screening  Discontinued    -See a dentist at least yearly  -Get your eyes checked and then per your eye specialist's recommendations  -Other issues addressed today:   -I have included below further information regarding a healthy whole foods based diet, physical activity guidelines for adults, stress management  and opportunities for social connections. I hope you find this information useful.   -----------------------------------------------------------------------------------------------------------------------------------------------------------------------------------------------------------------------------------------------------------    NUTRITION: -eat real food: lots of colorful vegetables (half the plate) and fruits -5-7 servings of vegetables and fruits per day (fresh or steamed is best), exp. 2 servings of vegetables with lunch and dinner and 2 servings of fruit per day. Berries and greens such as kale and collards are great choices.  -consume on a regular basis:  fresh fruits, fresh veggies, fish, nuts, seeds, healthy oils (such as olive oil, avocado oil), whole grains (make sure for bread/pasta/crackers/etc., that the first ingredient on label contains the word whole), legumes. -can eat small amounts of dairy and lean meat (no larger than the palm of your hand), but avoid processed meats such as ham, bacon, lunch meat, etc. -drink water -try to avoid fast food and pre-packaged foods, processed meat, ultra processed foods/beverages (donuts, candy, etc.) -most experts advise limiting sodium to < 2300mg  per day, should limit further is any chronic conditions such as high blood pressure, heart disease, diabetes, etc. The American Heart Association advised that < 1500mg  is is ideal -try to avoid foods/beverages that contain any ingredients with names you do not recognize  -try to avoid foods/beverages  with added sugar or sweeteners/sweets  -try to avoid sweet drinks (including diet drinks): soda, juice, Gatorade, sweet tea, power  drinks, diet drinks -try to avoid white rice, white bread, pasta (unless whole grain)  EXERCISE GUIDELINES FOR ADULTS: -if you wish to increase your physical activity, do so gradually and with the approval of your doctor -STOP and seek medical care immediately if  you have any chest pain, chest discomfort or trouble breathing when starting or increasing exercise  -move and stretch your body, legs, feet and arms when sitting for long periods -Physical activity guidelines for optimal health in adults: -get at least 150 minutes per week of moderate exercise (can talk, but not sing); this is about 20-30 minutes of sustained activity 5-7 days per week or two 10-15 minute episodes of sustained activity 5-7 days per week -do some muscle building/resistance training/strength training at least 2 days per week  -balance exercises 3+ days per week:   Stand somewhere where you have something sturdy to hold onto if you lose balance    1) lift up on toes, then back down, start with 5x per day and work up to 20x   2) stand and lift one leg straight out to the side so that foot is a few inches of the floor, start with 5x each side and work up to 20x each side   3) stand on one foot, start with 5 seconds each side and work up to 20 seconds on each side  If you need ideas or help with getting more active:  -Silver sneakers https://tools.silversneakers.com  -Walk with a Doc: http://www.duncan-williams.com/  -try to include resistance (weight lifting/strength building) and balance exercises twice per week: or the following link for ideas: http://castillo-powell.com/  BuyDucts.dk  STRESS MANAGEMENT: -can try meditating, or just sitting quietly with deep breathing while intentionally relaxing all parts of your body for 5 minutes daily -if you need further help with stress, anxiety or depression please follow up with your primary doctor or contact the wonderful folks at [facility]: [phone]  SOCIAL CONNECTIONS: -options in [place] if you wish to engage in more social and exercise related activities:  -Silver sneakers https://tools.silversneakers.com  -Walk with  a Doc: http://www.duncan-williams.com/  -Check out the [facility] Active Adults 50+ section on the [place] [employer] (hiking clubs, book clubs, cards and games, chess, exercise classes, aquatic classes and much more) - see the website for details: https://www.[place].gov/departments/parks-recreation/active-adults50  -YouTube has lots of exercise videos for different ages and abilities as well  -[facility] (a variety of indoor and outdoor inperson activities for adults). (618)080-5595. [address].  -Virtual Online Classes (a variety of topics): see seniorplanet.org or call (425) [phone]  -consider volunteering at a school, hospice center, church, senior center or elsewhere            Chiquita JONELLE Cramp, DO

## 2024-06-16 NOTE — Patient Instructions (Signed)
 I really enjoyed getting to talk with you today! I am available on Tuesdays and Thursdays for virtual visits if you have any questions or concerns, or if I can be of any further assistance.   CHECKLIST FROM ANNUAL WELLNESS VISIT:  -Follow up (please call to schedule if not scheduled after visit):   -yearly for annual wellness visit with primary care office  Here is a list of your preventive care/health maintenance measures and the plan for each if any are due:  PLAN For any measures below that may be due:    1. Can get vaccines at the pharmacy - please let us  know if you do so that we can update your records.   Health Maintenance  Topic Date Due   Medicare Annual Wellness (AWV)  10/30/2023   Influenza Vaccine  04/08/2024   COVID-19 Vaccine (4 - 2025-26 season) 05/09/2024   DTaP/Tdap/Td (4 - Td or Tdap) 06/05/2031   Pneumococcal Vaccine: 50+ Years  Completed   DEXA SCAN  Completed   Zoster Vaccines- Shingrix  Completed   Meningococcal B Vaccine  Aged Out   Colonoscopy  Discontinued   Hepatitis C Screening  Discontinued    -See a dentist at least yearly  -Get your eyes checked and then per your eye specialist's recommendations  -Other issues addressed today:   -I have included below further information regarding a healthy whole foods based diet, physical activity guidelines for adults, stress management and opportunities for social connections. I hope you find this information useful.   -----------------------------------------------------------------------------------------------------------------------------------------------------------------------------------------------------------------------------------------------------------    NUTRITION: -eat real food: lots of colorful vegetables (half the plate) and fruits -5-7 servings of vegetables and fruits per day (fresh or steamed is best), exp. 2 servings of vegetables with lunch and dinner and 2 servings of fruit per day.  Berries and greens such as kale and collards are great choices.  -consume on a regular basis:  fresh fruits, fresh veggies, fish, nuts, seeds, healthy oils (such as olive oil, avocado oil), whole grains (make sure for bread/pasta/crackers/etc., that the first ingredient on label contains the word whole), legumes. -can eat small amounts of dairy and lean meat (no larger than the palm of your hand), but avoid processed meats such as ham, bacon, lunch meat, etc. -drink water -try to avoid fast food and pre-packaged foods, processed meat, ultra processed foods/beverages (donuts, candy, etc.) -most experts advise limiting sodium to < 2300mg  per day, should limit further is any chronic conditions such as high blood pressure, heart disease, diabetes, etc. The American Heart Association advised that < 1500mg  is is ideal -try to avoid foods/beverages that contain any ingredients with names you do not recognize  -try to avoid foods/beverages  with added sugar or sweeteners/sweets  -try to avoid sweet drinks (including diet drinks): soda, juice, Gatorade, sweet tea, power drinks, diet drinks -try to avoid white rice, white bread, pasta (unless whole grain)  EXERCISE GUIDELINES FOR ADULTS: -if you wish to increase your physical activity, do so gradually and with the approval of your doctor -STOP and seek medical care immediately if you have any chest pain, chest discomfort or trouble breathing when starting or increasing exercise  -move and stretch your body, legs, feet and arms when sitting for long periods -Physical activity guidelines for optimal health in adults: -get at least 150 minutes per week of moderate exercise (can talk, but not sing); this is about 20-30 minutes of sustained activity 5-7 days per week or two 10-15 minute episodes of sustained activity  5-7 days per week -do some muscle building/resistance training/strength training at least 2 days per week  -balance exercises 3+ days per  week:   Stand somewhere where you have something sturdy to hold onto if you lose balance    1) lift up on toes, then back down, start with 5x per day and work up to 20x   2) stand and lift one leg straight out to the side so that foot is a few inches of the floor, start with 5x each side and work up to 20x each side   3) stand on one foot, start with 5 seconds each side and work up to 20 seconds on each side  If you need ideas or help with getting more active:  -Silver sneakers https://tools.silversneakers.com  -Walk with a Doc: http://www.duncan-williams.com/  -try to include resistance (weight lifting/strength building) and balance exercises twice per week: or the following link for ideas: http://castillo-powell.com/  BuyDucts.dk  STRESS MANAGEMENT: -can try meditating, or just sitting quietly with deep breathing while intentionally relaxing all parts of your body for 5 minutes daily -if you need further help with stress, anxiety or depression please follow up with your primary doctor or contact the wonderful folks at [facility]: [phone]  SOCIAL CONNECTIONS: -options in [place] if you wish to engage in more social and exercise related activities:  -Silver sneakers https://tools.silversneakers.com  -Walk with a Doc: http://www.duncan-williams.com/  -Check out the [facility] Active Adults 50+ section on the [place] of [employer] (hiking clubs, book clubs, cards and games, chess, exercise classes, aquatic classes and much more) - see the website for details: https://www.[place].gov/departments/parks-recreation/active-adults50  -YouTube has lots of exercise videos for different ages and abilities as well  -[facility] (a variety of indoor and outdoor inperson activities for adults). [phone]. [address]  -Virtual Online Classes (a variety of  topics): see seniorplanet.org or call 228-565-7825  -consider volunteering at a school, hospice center, church, senior center or elsewhere

## 2024-06-29 DIAGNOSIS — M5411 Radiculopathy, occipito-atlanto-axial region: Secondary | ICD-10-CM | POA: Diagnosis not present

## 2024-06-29 DIAGNOSIS — M9901 Segmental and somatic dysfunction of cervical region: Secondary | ICD-10-CM | POA: Diagnosis not present

## 2024-06-30 ENCOUNTER — Ambulatory Visit (HOSPITAL_COMMUNITY)
Admission: RE | Admit: 2024-06-30 | Discharge: 2024-06-30 | Disposition: A | Discharge status: Home / Self Care | Source: Ambulatory Visit | Attending: Physician Assistant | Admitting: Physician Assistant

## 2024-06-30 VITALS — BP 120/64 | HR 56 | Ht 63.8 in | Wt 135.2 lb

## 2024-06-30 DIAGNOSIS — I484 Atypical atrial flutter: Secondary | ICD-10-CM

## 2024-06-30 DIAGNOSIS — I4819 Other persistent atrial fibrillation: Secondary | ICD-10-CM

## 2024-06-30 DIAGNOSIS — I4891 Unspecified atrial fibrillation: Secondary | ICD-10-CM | POA: Diagnosis not present

## 2024-06-30 DIAGNOSIS — D6869 Other thrombophilia: Secondary | ICD-10-CM | POA: Diagnosis not present

## 2024-06-30 NOTE — Progress Notes (Signed)
 Primary Care Physician: Charlett Apolinar POUR, MD Primary Cardiologist: Dr Jeffrie Primary Electrophysiologist: Dr Cindie Referring Physician: Dr Jeffrie Nichole Berg is a 78 y.o. female with a history of HTN, HLD, CAD, and atrial fibrillation who presents for follow up in the [facility]. The patient was initially diagnosed with atrial fibrillation 06/25/20 after presenting to urgent care with symptoms of dizziness and palpitations. Patient is on Eliquis  for a CHADS2VASC score of 5. She underwent DCCV on 08/15/20. Unfortunately, she felt she was back in afib with palpitations about 3 weeks later. Patient is s/p afib ablation with Dr Cindie on 12/10/20.   On 03/16/22 she noted an elevated heart rate with tachypalpitations. She was in atrial flutter and underwent DCCV on 04/10/22. She was seen by Dr Cindie on 05/02/22 and watchful waiting was pursued at that time. She had increased burden of atrial flutter and is s/p repeat ablation 12/12/22. Patient is s/p DCCV on 09/03/23. She noted an elevated HR on 04/20/24 while camping in [place] and was found to be in atrial flutter, s/p DCCV on 05/16/24. In hindsight, she did have some wine on her trip and wonders if this could have been her trigger.  Patient returns for follow up for atrial fibrillation. She remains in SR today and feels well. No bleeding issues on anticoagulation.   Today, she  denies symptoms of palpitations, chest pain, shortness of breath, orthopnea, PND, lower extremity edema, dizziness, presyncope, syncope, snoring, daytime somnolence, bleeding, or neurologic sequela. The patient is tolerating medications without difficulties and is otherwise without complaint today.    Atrial Fibrillation Risk Factors:  she does not have symptoms or diagnosis of sleep apnea. she does not have a history of rheumatic fever. she does not have a history of alcohol use. The patient does have a history of early familial atrial fibrillation or  other arrhythmias. Son has afib.   Atrial Fibrillation Management history:  Previous antiarrhythmic drugs: none Previous cardioversions: 08/15/20, 04/10/22, 09/03/23 Previous ablations: 12/10/20, 11/04/22 Anticoagulation history: Eliquis    Past Medical History:  Diagnosis Date   Cataract 10/18/2011   Followed by opthalmology.    COLONIC POLYPS, HX OF 10/12/2009   COVID-19 03/2020   HYPERLIPIDEMIA 05/31/2007   NEPHROLITHIASIS, HX OF 05/31/2007    Current Outpatient Medications  Medication Sig Dispense Refill   acetaminophen  (TYLENOL ) 500 MG tablet Take 500 mg by mouth every 6 (six) hours as needed for moderate pain. (Patient taking differently: Take 500 mg by mouth as needed for moderate pain (pain score 4-6).)     carboxymethylcellulose (REFRESH PLUS) 0.5 % SOLN 1-2 drops 3 (three) times daily as needed (dry/irritated eyes.). (Patient taking differently: 1-2 drops as needed (dry/irritated eyes.).)     Cholecalciferol (VITAMIN D ) 2000 UNITS tablet Take 2,000 Units by mouth in the morning.     ELIQUIS  5 MG TABS tablet TAKE 1 TABLET TWICE DAILY 180 tablet 3   metoprolol  succinate (TOPROL -XL) 50 MG 24 hr tablet Take 50 mg by mouth in the morning. Take with or immediately following a meal.     Multiple Vitamin (MULTIVITAMIN WITH MINERALS) TABS tablet Take 1 tablet by mouth 2 (two) times a week.     Multiple Vitamins-Minerals (LUTEIN-ZEAXANTHIN PO) Take 1 tablet by mouth in the morning.     No current facility-administered medications for this encounter.    ROS- All systems are reviewed and negative except as per the HPI above.  Physical Exam: Vitals:   06/30/24 1456  BP: 120/64  Pulse: (!) 56  Weight: 61.3 kg  Height: 5' 3.8 (1.621 m)    GEN: Well nourished, well developed in no acute distress CARDIAC: Regular rate and rhythm, no murmurs, rubs, gallops RESPIRATORY:  Clear to auscultation without rales, wheezing or rhonchi  ABDOMEN: Soft, non-tender, non-distended EXTREMITIES:  No  edema; No deformity    Wt Readings from Last 3 Encounters:  06/30/24 61.3 kg  05/20/24 60.3 kg  05/10/24 61.8 kg    EKG today demonstrates  SR Vent. rate 56 BPM PR interval 226 ms QRS duration 86 ms QT/QTcB 470/453 ms   Echo 08/10/20 demonstrated  1. Left ventricular ejection fraction, by estimation, is 60 to 65%. The  left ventricle has normal function. The left ventricle has no regional  wall motion abnormalities. Left ventricular diastolic parameters are  indeterminate.   2. Right ventricular systolic function is normal. The right ventricular  size is normal. There is normal pulmonary artery systolic pressure.   3. Left atrial size was moderately dilated.   4. Right atrial size was mildly dilated.   5. The mitral valve is degenerative. Mild mitral valve regurgitation. No  evidence of mitral stenosis. Moderate mitral annular calcification.   6. Tricuspid valve regurgitation is mild to moderate.   7. The aortic valve is tricuspid. Aortic valve regurgitation is not  visualized. Mild to moderate aortic valve sclerosis/calcification is  present, without any evidence of aortic stenosis.   8. The inferior vena cava is normal in size with greater than 50%  respiratory variability, suggesting right atrial pressure of 3 mmHg.  Epic records are reviewed at length today   CHA2DS2-VASc Score = 5  The patient's score is based upon: CHF History: 0 HTN History: 1 Diabetes History: 0 Stroke History: 0 Vascular Disease History: 1 Age Score: 2 Gender Score: 1       ASSESSMENT AND PLAN: Persistent Atrial Fibrillation/atrial flutter (ICD10:  I48.19) The patient's CHA2DS2-VASc score is 5, indicating a 7.2% annual risk of stroke.   S/p afib ablation 12/10/20 and 12/12/22 S/p DCCV 05/16/24 Patient appears to be maintaining SR. If she has quick return of her afib, will consider dofetilide admission. Continue Eliquis  5 mg BID Continue Toprol  50 mg daily  Secondary Hypercoagulable State  (ICD10:  D68.69) The patient is at significant risk for stroke/thromboembolism based upon her CHA2DS2-VASc Score of 5.  Continue Apixaban  (Eliquis ). No bleeding issues.   HTN Stable on current regimen  CAD No anginal symptoms Followed by Dr Jeffrie   Follow up in the AF clinic in 6 months.    Daril Kicks PA-C [facility] [address] [place], [place] [address] [phone] 06/30/2024 3:26 PM

## 2024-07-07 ENCOUNTER — Other Ambulatory Visit: Payer: Self-pay | Admitting: Cardiology

## 2024-07-07 MED ORDER — METOPROLOL SUCCINATE ER 50 MG PO TB24: 50.0000 mg | ORAL_TABLET | Freq: Every morning | ORAL | 3 refills | Status: DC

## 2024-07-22 NOTE — Progress Notes (Deleted)
 [facility] [address] [place] [address] Phone: [phone] Subjective:    I'm seeing this patient by the request  of:  Panosh, Apolinar POUR, MD  CC:   Nichole Berg  Nichole Berg is a 78 y.o. female coming in with complaint of R shoulder pain. Last seen in 2022 for R shoulder pain. Patient states      Past Medical History:  Diagnosis Date   Cataract 10/18/2011   Followed by opthalmology.    COLONIC POLYPS, HX OF 10/12/2009   COVID-19 03/2020   HYPERLIPIDEMIA 05/31/2007   NEPHROLITHIASIS, HX OF 05/31/2007   Past Surgical History:  Procedure Laterality Date   ATRIAL FIBRILLATION ABLATION N/A 12/10/2020   Procedure: ATRIAL FIBRILLATION ABLATION;  Surgeon: Cindie Ole DASEN, MD;  Location: [facility] INVASIVE CV LAB;  Service: Cardiovascular;  Laterality: N/A;   ATRIAL FIBRILLATION ABLATION N/A 12/12/2022   Procedure: ATRIAL FIBRILLATION ABLATION;  Surgeon: Cindie Ole DASEN, MD;  Location: [facility] INVASIVE CV LAB;  Service: Cardiovascular;  Laterality: N/A;   CARDIOVERSION N/A 08/15/2020   Procedure: CARDIOVERSION;  Surgeon: Hobart Powell BRAVO, MD;  Location: [facility] ENDOSCOPY;  Service: Cardiovascular;  Laterality: N/A;   CARDIOVERSION N/A 04/10/2022   Procedure: CARDIOVERSION;  Surgeon: Santo Stanly LABOR, MD;  Location: [facility] ENDOSCOPY;  Service: Cardiovascular;  Laterality: N/A;   CARDIOVERSION N/A 09/03/2023   Procedure: CARDIOVERSION;  Surgeon: Okey Vina GAILS, MD;  Location: [facility] INVASIVE CV LAB;  Service: Cardiovascular;  Laterality: N/A;   CARDIOVERSION N/A 05/16/2024   Procedure: CARDIOVERSION;  Surgeon: Jeffrie Oneil BROCKS, MD;  Location: [facility] INVASIVE CV LAB;  Service: Cardiovascular;  Laterality: N/A;   CATARACT EXTRACTION, BILATERAL     Social History   Socioeconomic History   Marital status: Single    Spouse name: Not on file   Number of children: Not on file   Years of education: Not on file   Highest education level: Not on file  Occupational History    Occupation: Retired  Tobacco Use   Smoking status: Former    Current packs/day: 0.00    Types: Cigarettes    Start date: 09/08/1962    Quit date: 09/08/1964    Years since quitting: 59.9   Smokeless tobacco: Never   Tobacco comments:    Former smoker 03/18/22  Vaping Use   Vaping status: Never Used  Substance and Sexual Activity   Alcohol use: Yes    Comment: occassional   Drug use: No   Sexual activity: Not on file  Other Topics Concern   Not on file  Social History Narrative   Retired publishing rights manager    Non smoker   Works as engineer, 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    [employer] 2 1 cat neg tob      Social Drivers of Corporate Investment Banker Strain: Low Risk  (10/29/2022)   Overall Financial Resource Strain (CARDIA)    Difficulty of Paying Living Expenses: Not hard at all  Food Insecurity: No Food Insecurity (10/29/2022)   Hunger Vital Sign    Worried About Running Out of Food in the Last Year: Never true    Ran Out of Food in the Last Year: Never true  Transportation Needs: No Transportation Needs (10/29/2022)   PRAPARE - Administrator, Civil Service (Medical): No    Lack of Transportation (Non-Medical): No  Physical Activity: Sufficiently Active (09/08/2023)   Exercise Vital Sign    Days of Exercise per Week: 7 days  Minutes of Exercise per Session: 30 min  Stress: Stress Concern Present (09/08/2023)   [employer] of Occupational Health - Occupational Stress Questionnaire    Feeling of Stress : To some extent  Social Connections: Moderately Isolated (10/29/2022)   Social Connection and Isolation Panel    Frequency of Communication with Friends and Family: More than three times a week    Frequency of Social Gatherings with Friends and Family: More than three times a week    Attends Religious Services: Never    Database Administrator or Organizations: No    Attends Banker Meetings: Never    Marital Status: Living with partner   Allergies   Allergen Reactions   Iodinated Contrast Media Hives and Itching   Pravastatin      Headache on 40mg  daily dosing. Tolerates 10 and 20mg    Rosuvastatin      rosuvastatin  5mg  3x weekly and daily - headaches and myalgias   Sulfonamide Derivatives Itching and Rash   Family History  Problem Relation Age of Onset   Atrial fibrillation Mother        Has pacer doing very well age 35 passed suddnely age 54 poss CV     Current Outpatient Medications (Cardiovascular):    metoprolol  succinate (TOPROL -XL) 50 MG 24 hr tablet, Take 1 tablet (50 mg total) by mouth in the morning. Take with or immediately following a meal.   Current Outpatient Medications (Analgesics):    acetaminophen  (TYLENOL ) 500 MG tablet, Take 500 mg by mouth every 6 (six) hours as needed for moderate pain. (Patient taking differently: Take 500 mg by mouth as needed for moderate pain (pain score 4-6).)  Current Outpatient Medications (Hematological):    ELIQUIS  5 MG TABS tablet, TAKE 1 TABLET TWICE DAILY  Current Outpatient Medications (Other):    carboxymethylcellulose (REFRESH PLUS) 0.5 % SOLN, 1-2 drops 3 (three) times daily as needed (dry/irritated eyes.). (Patient taking differently: 1-2 drops as needed (dry/irritated eyes.).)   Cholecalciferol (VITAMIN D ) 2000 UNITS tablet, Take 2,000 Units by mouth in the morning.   Multiple Vitamin (MULTIVITAMIN WITH MINERALS) TABS tablet, Take 1 tablet by mouth 2 (two) times a week.   Multiple Vitamins-Minerals (LUTEIN-ZEAXANTHIN PO)*, Take 1 tablet by mouth in the morning. * These medications belong to multiple therapeutic classes and are listed under each applicable group.   Reviewed prior external information including notes and imaging from  primary care provider As well as notes that were available from care everywhere and other healthcare systems.  Past medical history, social, surgical and family history all reviewed in electronic medical record.  No pertanent information  unless stated regarding to the chief complaint.   Review of Systems:  No headache, visual changes, nausea, vomiting, diarrhea, constipation, dizziness, abdominal pain, skin rash, fevers, chills, night sweats, weight loss, swollen lymph nodes, body aches, joint swelling, chest pain, shortness of breath, mood changes. POSITIVE muscle aches  Objective  There were no vitals taken for this visit.   General: No apparent distress alert and oriented x3 mood and affect normal, dressed appropriately.  HEENT: Pupils equal, extraocular movements intact  Respiratory: Patient's speak in full sentences and does not appear short of breath  Cardiovascular: No lower extremity edema, non tender, no erythema      Impression and Recommendations:

## 2024-07-25 ENCOUNTER — Ambulatory Visit: Admitting: Family Medicine

## 2024-07-25 ENCOUNTER — Other Ambulatory Visit

## 2024-07-25 DIAGNOSIS — Z006 Encounter for examination for normal comparison and control in clinical research program: Secondary | ICD-10-CM

## 2024-07-26 DIAGNOSIS — M9901 Segmental and somatic dysfunction of cervical region: Secondary | ICD-10-CM | POA: Diagnosis not present

## 2024-07-26 DIAGNOSIS — M5411 Radiculopathy, occipito-atlanto-axial region: Secondary | ICD-10-CM | POA: Diagnosis not present

## 2024-08-05 LAB — GENECONNECT MOLECULAR SCREEN: Genetic Analysis Overall Interpretation: NEGATIVE

## 2024-08-08 ENCOUNTER — Encounter (HOSPITAL_COMMUNITY): Payer: Self-pay | Admitting: *Deleted

## 2024-08-15 ENCOUNTER — Ambulatory Visit (HOSPITAL_COMMUNITY): Payer: Self-pay | Admitting: Physician Assistant

## 2024-08-15 ENCOUNTER — Encounter (HOSPITAL_COMMUNITY): Payer: Self-pay | Admitting: Physician Assistant

## 2024-08-15 ENCOUNTER — Ambulatory Visit (HOSPITAL_COMMUNITY)
Admission: RE | Admit: 2024-08-15 | Discharge: 2024-08-15 | Attending: Physician Assistant | Admitting: Physician Assistant

## 2024-08-15 VITALS — BP 104/84 | HR 118 | Ht 63.8 in | Wt 135.6 lb

## 2024-08-15 DIAGNOSIS — I4819 Other persistent atrial fibrillation: Secondary | ICD-10-CM | POA: Diagnosis not present

## 2024-08-15 DIAGNOSIS — D6869 Other thrombophilia: Secondary | ICD-10-CM | POA: Diagnosis not present

## 2024-08-15 DIAGNOSIS — I484 Atypical atrial flutter: Secondary | ICD-10-CM

## 2024-08-15 LAB — CBC
Hematocrit: 37 % (ref 34.0–46.6)
Hemoglobin: 12 g/dL (ref 11.1–15.9)
MCH: 30.2 pg (ref 26.6–33.0)
MCHC: 32.4 g/dL (ref 31.5–35.7)
MCV: 93 fL (ref 79–97)
Platelets: 279 x10E3/uL (ref 150–450)
RBC: 3.98 x10E6/uL (ref 3.77–5.28)
RDW: 13.1 % (ref 11.7–15.4)
WBC: 5.1 x10E3/uL (ref 3.4–10.8)

## 2024-08-15 LAB — BASIC METABOLIC PANEL WITH GFR
BUN/Creatinine Ratio: 18 (ref 12–28)
BUN: 12 mg/dL (ref 8–27)
CO2: 28 mmol/L (ref 20–29)
Calcium: 9.6 mg/dL (ref 8.7–10.3)
Chloride: 98 mmol/L (ref 96–106)
Creatinine, Ser: 0.67 mg/dL (ref 0.57–1.00)
Glucose: 93 mg/dL (ref 70–99)
Potassium: 4.4 mmol/L (ref 3.5–5.2)
Sodium: 137 mmol/L (ref 134–144)
eGFR: 88 mL/min/1.73 (ref >59)

## 2024-08-15 LAB — MAGNESIUM: Magnesium: 2 mg/dL (ref 1.6–2.3)

## 2024-08-15 NOTE — Progress Notes (Signed)
 Primary Care Physician: Charlett Apolinar POUR, MD Primary Cardiologist: Dr Jeffrie Primary Electrophysiologist: Dr Cindie Referring Physician: Dr Jeffrie Nichole Berg is a 78 y.o. female with a history of HTN, HLD, CAD, and atrial fibrillation who presents for follow up in the [facility]. The patient was initially diagnosed with atrial fibrillation 06/25/20 after presenting to urgent care with symptoms of dizziness and palpitations. Patient is on Eliquis  for stroke prevention. She underwent DCCV on 08/15/20. Unfortunately, she felt she was back in afib with palpitations about 3 weeks later. Patient is s/p afib ablation with Dr Cindie on 12/10/20.   On 03/16/22 she noted an elevated heart rate with tachypalpitations. She was in atrial flutter and underwent DCCV on 04/10/22. She was seen by Dr Cindie on 05/02/22 and watchful waiting was pursued at that time. She had increased burden of atrial flutter and is s/p repeat ablation 12/12/22. Patient is s/p DCCV on 09/03/23. She noted an elevated HR on 04/20/24 while camping in [place] and was found to be in atrial flutter, s/p DCCV on 05/16/24. In hindsight, she did have some wine on her trip and wonders if this could have been her trigger.  Patient returns for follow up for atrial fibrillation and atrial flutter. She reports that she went into atrial flutter about 10 days ago. There were no specific triggers that she could identify. She denies any bleeding issues on anticoagulation.   Today, she  denies symptoms of palpitations, chest pain, shortness of breath, orthopnea, PND, lower extremity edema, dizziness, presyncope, syncope, snoring, daytime somnolence, bleeding, or neurologic sequela. The patient is tolerating medications without difficulties and is otherwise without complaint today.    Atrial Fibrillation Risk Factors:  she does not have symptoms or diagnosis of sleep apnea. she does not have a history of rheumatic fever. she does not  have a history of alcohol use. The patient does have a history of early familial atrial fibrillation or other arrhythmias. Son has afib.   Atrial Fibrillation Management history:  Previous antiarrhythmic drugs: none Previous cardioversions: 08/15/20, 04/10/22, 09/03/23 Previous ablations: 12/10/20, 11/04/22 Anticoagulation history: Eliquis    Past Medical History:  Diagnosis Date   Cataract 10/18/2011   Followed by opthalmology.    COLONIC POLYPS, HX OF 10/12/2009   COVID-19 03/2020   HYPERLIPIDEMIA 05/31/2007   NEPHROLITHIASIS, HX OF 05/31/2007    Current Outpatient Medications  Medication Sig Dispense Refill   acetaminophen  (TYLENOL ) 500 MG tablet Take 500 mg by mouth every 6 (six) hours as needed for moderate pain.     carboxymethylcellulose (REFRESH PLUS) 0.5 % SOLN 1-2 drops 3 (three) times daily as needed (dry/irritated eyes.).     Cholecalciferol (VITAMIN D ) 2000 UNITS tablet Take 2,000 Units by mouth in the morning.     ELIQUIS  5 MG TABS tablet TAKE 1 TABLET TWICE DAILY 180 tablet 3   metoprolol  succinate (TOPROL -XL) 50 MG 24 hr tablet Take 1 tablet (50 mg total) by mouth in the morning. Take with or immediately following a meal. 90 tablet 3   Multiple Vitamin (MULTIVITAMIN WITH MINERALS) TABS tablet Take 1 tablet by mouth 2 (two) times a week.     Multiple Vitamins-Minerals (LUTEIN-ZEAXANTHIN PO) Take 1 tablet by mouth in the morning.     No current facility-administered medications for this encounter.    ROS- All systems are reviewed and negative except as per the HPI above.  Physical Exam: Vitals:   08/15/24 0928  BP: 104/84  Pulse: (!) 118  Weight: 61.5 kg  Height: 5' 3.8 (1.621 m)     GEN: Well nourished, well developed in no acute distress CARDIAC: Regular rate and rhythm, tachycardia, no murmurs, rubs, gallops RESPIRATORY:  Clear to auscultation without rales, wheezing or rhonchi  ABDOMEN: Soft, non-tender, non-distended EXTREMITIES:  No edema; No deformity     Wt Readings from Last 3 Encounters:  08/15/24 61.5 kg  06/30/24 61.3 kg  05/20/24 60.3 kg    EKG Interpretation Date/Time:  Monday August 15 2024 09:31:13 EST Ventricular Rate:  118 PR Interval:  202 QRS Duration:  78 QT Interval:  336 QTC Calculation: 470 R Axis:   58  Text Interpretation: Atrial flutter Abnormal ECG When compared with ECG of 30-Jun-2024 15:04, Atrial flutter has replaced Sinus rhythm Confirmed by Dannika Hilgeman ([id number]) on 08/15/2024 9:41:33 AM    Echo 08/10/20 demonstrated  1. Left ventricular ejection fraction, by estimation, is 60 to 65%. The  left ventricle has normal function. The left ventricle has no regional  wall motion abnormalities. Left ventricular diastolic parameters are  indeterminate.   2. Right ventricular systolic function is normal. The right ventricular  size is normal. There is normal pulmonary artery systolic pressure.   3. Left atrial size was moderately dilated.   4. Right atrial size was mildly dilated.   5. The mitral valve is degenerative. Mild mitral valve regurgitation. No  evidence of mitral stenosis. Moderate mitral annular calcification.   6. Tricuspid valve regurgitation is mild to moderate.   7. The aortic valve is tricuspid. Aortic valve regurgitation is not  visualized. Mild to moderate aortic valve sclerosis/calcification is  present, without any evidence of aortic stenosis.   8. The inferior vena cava is normal in size with greater than 50%  respiratory variability, suggesting right atrial pressure of 3 mmHg.  Epic records are reviewed at length today   CHA2DS2-VASc Score = 5  The patient's score is based upon: CHF History: 0 HTN History: 1 Diabetes History: 0 Stroke History: 0 Vascular Disease History: 1 Age Score: 2 Gender Score: 1        ASSESSMENT AND PLAN: Persistent Atrial Fibrillation/atrial flutter (ICD10:  I48.19) The patient's CHA2DS2-VASc score is 5, indicating a 7.2% annual risk of stroke.    S/p afib ablation 12/10/20 and 12/12/22 She is back in atrial flutter, just had a DCCV in September. We discussed rhythm control options today.  Patient would like to pursue dofetilide admission PharmD to screen medications for QT prolonging agents. QTc in SR 453 ms Check bmet/mag/cbc today. Continue Eliquis  5 mg BID Continue Toprol  50 mg daily Since she can't be admitted for dofetilide until after the holidays, will pursue DCCV short term.   Secondary Hypercoagulable State (ICD10:  D68.69) The patient is at significant risk for stroke/thromboembolism based upon her CHA2DS2-VASc Score of 5.  Continue Apixaban  (Eliquis ). No bleeding issues.   HTN Stable on current regimen  CAD No anginal symptoms Followed by Dr Jeffrie   Follow up for dofetilide loading post DCCV.    Informed Consent   Shared Decision Making/Informed Consent The risks (stroke, cardiac arrhythmias rarely resulting in the need for a temporary or permanent pacemaker, skin irritation or burns and complications associated with conscious sedation including aspiration, arrhythmia, respiratory failure and death), benefits (restoration of normal sinus rhythm) and alternatives of a direct current cardioversion were explained in detail to Ms. Margerum and she agrees to proceed.       Daril Kicks PA-C Afib Clinic [facility] [address] [place], [place] [address] [phone] 08/15/2024 9:41 AM

## 2024-08-15 NOTE — H&P (View-Only) (Signed)
 Primary Care Physician: Charlett Apolinar POUR, MD Primary Cardiologist: Dr Jeffrie Primary Electrophysiologist: Dr Cindie Referring Physician: Dr Jeffrie Nichole Berg is a 78 y.o. female with a history of HTN, HLD, CAD, and atrial fibrillation who presents for follow up in the [facility]. The patient was initially diagnosed with atrial fibrillation 06/25/20 after presenting to urgent care with symptoms of dizziness and palpitations. Patient is on Eliquis  for stroke prevention. She underwent DCCV on 08/15/20. Unfortunately, she felt she was back in afib with palpitations about 3 weeks later. Patient is s/p afib ablation with Dr Cindie on 12/10/20.   On 03/16/22 she noted an elevated heart rate with tachypalpitations. She was in atrial flutter and underwent DCCV on 04/10/22. She was seen by Dr Cindie on 05/02/22 and watchful waiting was pursued at that time. She had increased burden of atrial flutter and is s/p repeat ablation 12/12/22. Patient is s/p DCCV on 09/03/23. She noted an elevated HR on 04/20/24 while camping in [place] and was found to be in atrial flutter, s/p DCCV on 05/16/24. In hindsight, she did have some wine on her trip and wonders if this could have been her trigger.  Patient returns for follow up for atrial fibrillation and atrial flutter. She reports that she went into atrial flutter about 10 days ago. There were no specific triggers that she could identify. She denies any bleeding issues on anticoagulation.   Today, she  denies symptoms of palpitations, chest pain, shortness of breath, orthopnea, PND, lower extremity edema, dizziness, presyncope, syncope, snoring, daytime somnolence, bleeding, or neurologic sequela. The patient is tolerating medications without difficulties and is otherwise without complaint today.    Atrial Fibrillation Risk Factors:  she does not have symptoms or diagnosis of sleep apnea. she does not have a history of rheumatic fever. she does not  have a history of alcohol use. The patient does have a history of early familial atrial fibrillation or other arrhythmias. Son has afib.   Atrial Fibrillation Management history:  Previous antiarrhythmic drugs: none Previous cardioversions: 08/15/20, 04/10/22, 09/03/23 Previous ablations: 12/10/20, 11/04/22 Anticoagulation history: Eliquis    Past Medical History:  Diagnosis Date   Cataract 10/18/2011   Followed by opthalmology.    COLONIC POLYPS, HX OF 10/12/2009   COVID-19 03/2020   HYPERLIPIDEMIA 05/31/2007   NEPHROLITHIASIS, HX OF 05/31/2007    Current Outpatient Medications  Medication Sig Dispense Refill   acetaminophen  (TYLENOL ) 500 MG tablet Take 500 mg by mouth every 6 (six) hours as needed for moderate pain.     carboxymethylcellulose (REFRESH PLUS) 0.5 % SOLN 1-2 drops 3 (three) times daily as needed (dry/irritated eyes.).     Cholecalciferol (VITAMIN D ) 2000 UNITS tablet Take 2,000 Units by mouth in the morning.     ELIQUIS  5 MG TABS tablet TAKE 1 TABLET TWICE DAILY 180 tablet 3   metoprolol  succinate (TOPROL -XL) 50 MG 24 hr tablet Take 1 tablet (50 mg total) by mouth in the morning. Take with or immediately following a meal. 90 tablet 3   Multiple Vitamin (MULTIVITAMIN WITH MINERALS) TABS tablet Take 1 tablet by mouth 2 (two) times a week.     Multiple Vitamins-Minerals (LUTEIN-ZEAXANTHIN PO) Take 1 tablet by mouth in the morning.     No current facility-administered medications for this encounter.    ROS- All systems are reviewed and negative except as per the HPI above.  Physical Exam: Vitals:   08/15/24 0928  BP: 104/84  Pulse: (!) 118  Weight: 61.5 kg  Height: 5' 3.8 (1.621 m)     GEN: Well nourished, well developed in no acute distress CARDIAC: Regular rate and rhythm, tachycardia, no murmurs, rubs, gallops RESPIRATORY:  Clear to auscultation without rales, wheezing or rhonchi  ABDOMEN: Soft, non-tender, non-distended EXTREMITIES:  No edema; No deformity     Wt Readings from Last 3 Encounters:  08/15/24 61.5 kg  06/30/24 61.3 kg  05/20/24 60.3 kg    EKG Interpretation Date/Time:  Monday August 15 2024 09:31:13 EST Ventricular Rate:  118 PR Interval:  202 QRS Duration:  78 QT Interval:  336 QTC Calculation: 470 R Axis:   58  Text Interpretation: Atrial flutter Abnormal ECG When compared with ECG of 30-Jun-2024 15:04, Atrial flutter has replaced Sinus rhythm Confirmed by Dannika Hilgeman ([id number]) on 08/15/2024 9:41:33 AM    Echo 08/10/20 demonstrated  1. Left ventricular ejection fraction, by estimation, is 60 to 65%. The  left ventricle has normal function. The left ventricle has no regional  wall motion abnormalities. Left ventricular diastolic parameters are  indeterminate.   2. Right ventricular systolic function is normal. The right ventricular  size is normal. There is normal pulmonary artery systolic pressure.   3. Left atrial size was moderately dilated.   4. Right atrial size was mildly dilated.   5. The mitral valve is degenerative. Mild mitral valve regurgitation. No  evidence of mitral stenosis. Moderate mitral annular calcification.   6. Tricuspid valve regurgitation is mild to moderate.   7. The aortic valve is tricuspid. Aortic valve regurgitation is not  visualized. Mild to moderate aortic valve sclerosis/calcification is  present, without any evidence of aortic stenosis.   8. The inferior vena cava is normal in size with greater than 50%  respiratory variability, suggesting right atrial pressure of 3 mmHg.  Epic records are reviewed at length today   CHA2DS2-VASc Score = 5  The patient's score is based upon: CHF History: 0 HTN History: 1 Diabetes History: 0 Stroke History: 0 Vascular Disease History: 1 Age Score: 2 Gender Score: 1        ASSESSMENT AND PLAN: Persistent Atrial Fibrillation/atrial flutter (ICD10:  I48.19) The patient's CHA2DS2-VASc score is 5, indicating a 7.2% annual risk of stroke.    S/p afib ablation 12/10/20 and 12/12/22 She is back in atrial flutter, just had a DCCV in September. We discussed rhythm control options today.  Patient would like to pursue dofetilide admission PharmD to screen medications for QT prolonging agents. QTc in SR 453 ms Check bmet/mag/cbc today. Continue Eliquis  5 mg BID Continue Toprol  50 mg daily Since she can't be admitted for dofetilide until after the holidays, will pursue DCCV short term.   Secondary Hypercoagulable State (ICD10:  D68.69) The patient is at significant risk for stroke/thromboembolism based upon her CHA2DS2-VASc Score of 5.  Continue Apixaban  (Eliquis ). No bleeding issues.   HTN Stable on current regimen  CAD No anginal symptoms Followed by Dr Jeffrie   Follow up for dofetilide loading post DCCV.    Informed Consent   Shared Decision Making/Informed Consent The risks (stroke, cardiac arrhythmias rarely resulting in the need for a temporary or permanent pacemaker, skin irritation or burns and complications associated with conscious sedation including aspiration, arrhythmia, respiratory failure and death), benefits (restoration of normal sinus rhythm) and alternatives of a direct current cardioversion were explained in detail to Nichole Berg and she agrees to proceed.       Daril Kicks PA-C Afib Clinic [facility] [address]  [address] [place], [place] [address] [phone] 08/15/2024 9:41 AM

## 2024-08-15 NOTE — Patient Instructions (Signed)
 Cardioversion scheduled for: Friday December 12th   - Arrive at the [facility] ([address]  and check in with ADMITTING at 10:00 AM   - Do not eat or drink anything after midnight the night prior to your procedure.   - Take all your morning medication (except diabetic medications) with a sip of water prior to arrival.  - Do NOT miss any doses of your blood thinner - if you should miss a dose or take a dose more than 4 hours late -- please notify our office immediately.  - You will not be able to drive home after your procedure. Please ensure you have a responsible adult to drive you home. You will need someone with you for 24 hours post procedure.     - Expect to be in the procedural area approximately 2 hours.   - If you feel as if you go back into normal rhythm prior to scheduled cardioversion, please notify our office immediately.   If your procedure is canceled in the cardioversion suite you will be charged a cancellation fee.  ~~~~~~~~~~~~~~~~~~~~~~~~~~~~~~~~~~~~~~~~~~~~~~~~~~~~~~~~~~~~~~~~~~~  [facility] Admission is scheduled for: Monday January 12th   Come to the [facility] for pre-admission work up at: 8:30 AM   You may eat/drink prior to the appointment. Please take all your normal morning medications prior to arrival.   If you should miss any doses of your blood thinner going forward please notify our office immediately.   If you start any new medications after your admission is scheduled please notify our office immediately.  No Benadryl  use at least 3 days prior to admission.   We will start the authorization process with your insurance for the hospital stay.   You may bring a suitcase when you check in to admissions with belongings for your stay - clothes to keep you comfortable and things to keep you busy.   All medications will be provided for you while in the hospital.  If you are on a CPAP machine -  please bring with you to the hospital.    Let me know if you have any questions regarding the above instructions or about the admission in general.   [facility] RN Afib Clinic [phone]

## 2024-08-17 ENCOUNTER — Telehealth: Payer: Self-pay | Admitting: Pharmacist

## 2024-08-17 NOTE — Telephone Encounter (Signed)
Medication list reviewed in anticipation of upcoming Tikosyn initiation. Patient is not taking any contraindicated or QTc prolonging medications.   Patient is anticoagulated on Eliquis on the appropriate dose. Please ensure that patient has not missed any anticoagulation doses in the 3 weeks prior to Tikosyn initiation.   Patient will need to be counseled to avoid use of Benadryl while on Tikosyn and in the 2-3 days prior to Tikosyn initiation.  

## 2024-08-18 NOTE — Progress Notes (Signed)
 Pt called for pre procedure instructions.  Message left in ID voicemail Arrival time 1000 NPO after midnight explained Instructed to take am meds with sip of water and confirmed blood thinner consistency Instructed pt need for ride home tomorrow and have responsible adult with them for 24 hrs post procedure.

## 2024-08-19 ENCOUNTER — Ambulatory Visit (HOSPITAL_COMMUNITY): Admitting: Anesthesiology

## 2024-08-19 ENCOUNTER — Encounter (HOSPITAL_COMMUNITY): Payer: Self-pay | Admitting: Student in an Organized Health Care Education/Training Program

## 2024-08-19 ENCOUNTER — Ambulatory Visit (HOSPITAL_COMMUNITY)
Admission: RE | Admit: 2024-08-19 | Discharge: 2024-08-19 | Disposition: A | Attending: Student in an Organized Health Care Education/Training Program | Admitting: Student in an Organized Health Care Education/Training Program

## 2024-08-19 ENCOUNTER — Other Ambulatory Visit: Payer: Self-pay

## 2024-08-19 ENCOUNTER — Encounter (HOSPITAL_COMMUNITY)
Admission: RE | Disposition: A | Payer: Self-pay | Source: Home / Self Care | Attending: Student in an Organized Health Care Education/Training Program

## 2024-08-19 DIAGNOSIS — I4819 Other persistent atrial fibrillation: Secondary | ICD-10-CM | POA: Diagnosis not present

## 2024-08-19 DIAGNOSIS — I4892 Unspecified atrial flutter: Secondary | ICD-10-CM | POA: Diagnosis not present

## 2024-08-19 DIAGNOSIS — Z79899 Other long term (current) drug therapy: Secondary | ICD-10-CM | POA: Insufficient documentation

## 2024-08-19 DIAGNOSIS — I2510 Atherosclerotic heart disease of native coronary artery without angina pectoris: Secondary | ICD-10-CM | POA: Diagnosis not present

## 2024-08-19 DIAGNOSIS — I4891 Unspecified atrial fibrillation: Secondary | ICD-10-CM | POA: Diagnosis not present

## 2024-08-19 DIAGNOSIS — E785 Hyperlipidemia, unspecified: Secondary | ICD-10-CM | POA: Diagnosis not present

## 2024-08-19 DIAGNOSIS — D6869 Other thrombophilia: Secondary | ICD-10-CM | POA: Diagnosis not present

## 2024-08-19 DIAGNOSIS — I10 Essential (primary) hypertension: Secondary | ICD-10-CM | POA: Diagnosis not present

## 2024-08-19 DIAGNOSIS — Z7901 Long term (current) use of anticoagulants: Secondary | ICD-10-CM | POA: Insufficient documentation

## 2024-08-19 HISTORY — PX: CARDIOVERSION: EP1203

## 2024-08-19 SURGERY — CARDIOVERSION (CATH LAB)
Anesthesia: General

## 2024-08-19 MED ORDER — SODIUM CHLORIDE 0.9 % IV SOLN: INTRAVENOUS | Status: DC

## 2024-08-19 MED ORDER — PROPOFOL 10 MG/ML IV BOLUS
INTRAVENOUS | Status: DC | PRN
  Administered 2024-08-19: 50 mg via INTRAVENOUS

## 2024-08-19 SURGICAL SUPPLY — 1 items: PAD DEFIB RADIO PHYSIO CONN (PAD) ×1 IMPLANT

## 2024-08-19 NOTE — Anesthesia Preprocedure Evaluation (Addendum)
 Anesthesia Evaluation  Patient identified by MRN, date of birth, ID band Patient awake    Reviewed: Allergy & Precautions, NPO status , Patient's Chart, lab work & pertinent test results, reviewed documented beta blocker date and time   History of Anesthesia Complications Negative for: history of anesthetic complications  Airway Mallampati: II  TM Distance: >3 FB Neck ROM: Full    Dental  (+) Dental Advisory Given   Pulmonary neg shortness of breath, neg sleep apnea, neg COPD, neg recent URI, former smoker   breath sounds clear to auscultation       Cardiovascular (-) angina + CAD  (-) Past MI + dysrhythmias Atrial Fibrillation  Rhythm:Irregular  1. Left ventricular ejection fraction, by estimation, is 60 to 65%. The  left ventricle has normal function. The left ventricle has no regional  wall motion abnormalities. Left ventricular diastolic parameters are  indeterminate.   2. Right ventricular systolic function is normal. The right ventricular  size is normal. There is normal pulmonary artery systolic pressure.   3. Left atrial size was moderately dilated.   4. Right atrial size was mildly dilated.   5. The mitral valve is degenerative. Mild mitral valve regurgitation. No  evidence of mitral stenosis. Moderate mitral annular calcification.   6. Tricuspid valve regurgitation is mild to moderate.   7. The aortic valve is tricuspid. Aortic valve regurgitation is not  visualized. Mild to moderate aortic valve sclerosis/calcification is  present, without any evidence of aortic stenosis.   8. The inferior vena cava is normal in size with greater than 50%  respiratory variability, suggesting right atrial pressure of 3 mmHg.     Neuro/Psych negative neurological ROS  negative psych ROS   GI/Hepatic Neg liver ROS,,,  Endo/Other  negative endocrine ROS    Renal/GU Lab Results      Component                Value               Date                       NA                       137                 08/15/2024                K                        4.4                 08/15/2024                CO2                      28                  08/15/2024                GLUCOSE                  93                  08/15/2024                BUN  12                  08/15/2024                CREATININE               0.67                08/15/2024                CALCIUM                   9.6                 08/15/2024                GFR                      83.92               12/03/2021                EGFR                     88                  08/15/2024                GFRNONAA                 >60                 08/25/2023                Musculoskeletal  (+) Arthritis ,    Abdominal   Peds  Hematology  (+) Blood dyscrasia Lab Results      Component                Value               Date                      WBC                      5.1                 08/15/2024                HGB                      12.0                08/15/2024                HCT                      37.0                08/15/2024                MCV                      93                  08/15/2024                PLT  279                 08/15/2024            eliquis    Anesthesia Other Findings   Reproductive/Obstetrics                              Anesthesia Physical Anesthesia Plan  ASA: 2  Anesthesia Plan: General   Post-op Pain Management: Minimal or no pain anticipated   Induction:   PONV Risk Score and Plan: 3 and Treatment may vary due to age or medical condition  Airway Management Planned: Nasal Cannula, Natural Airway and Simple Face Mask  Additional Equipment: None  Intra-op Plan:   Post-operative Plan:   Informed Consent: I have reviewed the patients History and Physical, chart, labs and discussed the procedure including the risks, benefits and  alternatives for the proposed anesthesia with the patient or authorized representative who has indicated his/her understanding and acceptance.     Dental advisory given  Plan Discussed with: CRNA  Anesthesia Plan Comments:          Anesthesia Quick Evaluation

## 2024-08-19 NOTE — Interval H&P Note (Signed)
 History and Physical Interval Note:  08/19/2024 10:37 AM  [name]  has presented today for surgery, with the diagnosis of afib.  The various methods of treatment have been discussed with the patient and family. After consideration of risks, benefits and other options for treatment, the patient has consented to  Procedures: CARDIOVERSION (N/A) as a surgical intervention.  The patient's history has been reviewed, patient examined, no change in status, stable for surgery.  I have reviewed the patient's chart and labs.  Questions were answered to the patient's satisfaction.     Georganna Archer

## 2024-08-19 NOTE — Transfer of Care (Signed)
 Immediate Anesthesia Transfer of Care Note  Patient: Nichole Berg  Procedure(s) Performed: CARDIOVERSION  Patient Location: Cath Lab  Anesthesia Type:General  Level of Consciousness: awake, alert , and oriented  Airway & Oxygen Therapy: Patient Spontanous Breathing and Patient connected to nasal cannula oxygen  Post-op Assessment: Report given to RN and Post -op Vital signs reviewed and stable  Post vital signs: Reviewed and stable  Last Vitals:  Vitals Value Taken Time  BP 102/86 08/19/24 10:45  Temp    Pulse 115 08/19/24 10:45  Resp 24 08/19/24 10:45  SpO2 94 % 08/19/24 10:45  Vitals shown include unfiled device data.  Last Pain:  Vitals:   08/19/24 1046  TempSrc:   PainSc: 0-No pain         Complications: No notable events documented.

## 2024-08-19 NOTE — CV Procedure (Signed)
° °  CARDIOVERSION    Procedure:   DCCV  Indication:  Symptomatic atrial fibrillation  Procedure Note:  The patient signed informed consent.  The patient has had therapeutic oral anticoagulation uninterrupted for greater than 3 weeks.  Anesthesia was administered per the anesthesia team.  Adequate airway was maintained throughout and vital followed per protocol.  The patient was cardioverted x 1 with 360J of biphasic synchronized energy.  The patient converted to NSR.  There were no apparent complications.  The patient had normal neuro status and respiratory status post procedure with vitals stable as recorded elsewhere.     Paullette Mckain T. Floretta HEATH, MD [address]  [facility]  11:02 AM

## 2024-08-19 NOTE — Anesthesia Postprocedure Evaluation (Signed)
 Anesthesia Post Note  Patient: Nichole Berg  Procedure(s) Performed: CARDIOVERSION     Patient location during evaluation: Cath Lab Anesthesia Type: General Level of consciousness: awake and alert Pain management: pain level controlled Vital Signs Assessment: post-procedure vital signs reviewed and stable Respiratory status: spontaneous breathing, nonlabored ventilation and respiratory function stable Cardiovascular status: blood pressure returned to baseline and stable Postop Assessment: no apparent nausea or vomiting Anesthetic complications: no   No notable events documented.  Last Vitals:  Vitals:   08/19/24 1110 08/19/24 1115  BP: 95/63 (!) 86/66  Pulse: (!) 55 (!) 52  Resp: 13 14  Temp:    SpO2: 98% 98%    Last Pain:  Vitals:   08/19/24 1046  TempSrc:   PainSc: 0-No pain                 Blane Worthington

## 2024-08-29 ENCOUNTER — Telehealth (HOSPITAL_COMMUNITY): Payer: Self-pay | Admitting: *Deleted

## 2024-08-29 NOTE — Telephone Encounter (Signed)
 [facility] hospital admission inpatient stay approved through [employer] on 09/19/24 Auth number is [id number]

## 2024-08-31 ENCOUNTER — Ambulatory Visit: Admitting: Family Medicine

## 2024-09-12 NOTE — Progress Notes (Signed)
 "  Chief Complaint  Patient presents with   Annual Exam    HPI: Patient  Nichole Berg  79 y.o. comes in today for Preventive Health Care visit  On going issues  feels fine but having recurrent  Afib flutter :  CV  get sinus   last sept 24 and lasted a year .  Consideration about tikosyn.  At a fib clinic.   No more ablation.    Indicated .      Health Maintenance  Topic Date Due   COVID-19 Vaccine (5 - 2025-26 season) 09/29/2024 (Originally 05/09/2024)   Medicare Annual Wellness (AWV)  06/16/2025   DTaP/Tdap/Td (4 - Td or Tdap) 06/05/2031   Pneumococcal Vaccine: 50+ Years  Completed   Influenza Vaccine  Completed   Bone Density Scan  Completed   Zoster Vaccines- Shingrix  Completed   Meningococcal B Vaccine  Aged Out   Colonoscopy  Discontinued   Hepatitis C Screening  Discontinued   Health Maintenance Review LIFESTYLE:  Exercise:  active  Tobacco/ETS:n Alcohol:  Sugar beverages:n Sleep: ok Drug use: no HH of 2 partner showing memory decline     ROS:  GEN/ HEENT: No fever, significant weight changes sweats headaches vision problems hearing changes, CV/ PULM; No chest pain shortness of breath cough, syncope,edema  change in exercise tolerance. X as above  GI /GU: No adominal pain, vomiting, change in bowel habits. No blood in the stool. No significant GU symptoms. SKIN/HEME: ,no acute skin rashes suspicious lesions or bleeding. No lymphadenopathy, nodules, masses.  NEURO/ PSYCH:  No neurologic signs such as weakness numbness. No depression anxiety. IMM/ Allergy: No unusual infections.  Allergy .   REST of 12 system review negative except as per HPI   Past Medical History:  Diagnosis Date   Cataract 10/18/2011   Followed by opthalmology.    COLONIC POLYPS, HX OF 10/12/2009   COVID-19 03/2020   HYPERLIPIDEMIA 05/31/2007   NEPHROLITHIASIS, HX OF 05/31/2007    Past Surgical History:  Procedure Laterality Date   ATRIAL FIBRILLATION ABLATION N/A 12/10/2020   Procedure:  ATRIAL FIBRILLATION ABLATION;  Surgeon: Cindie Ole DASEN, MD;  Location: [facility] INVASIVE CV LAB;  Service: Cardiovascular;  Laterality: N/A;   ATRIAL FIBRILLATION ABLATION N/A 12/12/2022   Procedure: ATRIAL FIBRILLATION ABLATION;  Surgeon: Cindie Ole DASEN, MD;  Location: [facility] INVASIVE CV LAB;  Service: Cardiovascular;  Laterality: N/A;   CARDIOVERSION N/A 08/15/2020   Procedure: CARDIOVERSION;  Surgeon: Hobart Powell BRAVO, MD;  Location: [facility] ENDOSCOPY;  Service: Cardiovascular;  Laterality: N/A;   CARDIOVERSION N/A 04/10/2022   Procedure: CARDIOVERSION;  Surgeon: Santo Stanly LABOR, MD;  Location: [facility] ENDOSCOPY;  Service: Cardiovascular;  Laterality: N/A;   CARDIOVERSION N/A 09/03/2023   Procedure: CARDIOVERSION;  Surgeon: Okey Vina GAILS, MD;  Location: [facility] INVASIVE CV LAB;  Service: Cardiovascular;  Laterality: N/A;   CARDIOVERSION N/A 05/16/2024   Procedure: CARDIOVERSION;  Surgeon: Jeffrie Oneil BROCKS, MD;  Location: [facility] INVASIVE CV LAB;  Service: Cardiovascular;  Laterality: N/A;   CARDIOVERSION N/A 08/19/2024   Procedure: CARDIOVERSION;  Surgeon: Floretta Mallard, MD;  Location: [facility] INVASIVE CV LAB;  Service: Cardiovascular;  Laterality: N/A;   CATARACT EXTRACTION, BILATERAL      Family History  Problem Relation Age of Onset   Atrial fibrillation Mother        Has pacer doing very well age 50 passed suddnely age 64 poss CV    Social History   Socioeconomic History   Marital status: Single  Spouse name: Not on file   Number of children: Not on file   Years of education: Not on file   Highest education level: Not on file  Occupational History   Occupation: Retired  Tobacco Use   Smoking status: Former    Current packs/day: 0.00    Types: Cigarettes    Start date: 09/08/1962    Quit date: 09/08/1964    Years since quitting: 60.0   Smokeless tobacco: Never   Tobacco comments:    Former smoker 03/18/22  Vaping Use   Vaping status: Never Used  Substance and Sexual Activity   Alcohol  use: Yes    Comment: occassional   Drug use: No   Sexual activity: Not on file  Other Topics Concern   Not on file  Social History Narrative   Retired publishing rights manager    Non smoker   Works as engineer, 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    H hof 2 1 cat neg tob      Social Drivers of Health   Tobacco Use: Medium Risk (09/13/2024)   Patient History    Smoking Tobacco Use: Former    Smokeless Tobacco Use: Never    Passive Exposure: Not on Actuary Strain: Low Risk (10/29/2022)   Overall Financial Resource Strain (CARDIA)    Difficulty of Paying Living Expenses: Not hard at all  Food Insecurity: No Food Insecurity (10/29/2022)   Hunger Vital Sign    Worried About Running Out of Food in the Last Year: Never true    Ran Out of Food in the Last Year: Never true  Transportation Needs: No Transportation Needs (10/29/2022)   PRAPARE - Administrator, Civil Service (Medical): No    Lack of Transportation (Non-Medical): No  Physical Activity: Sufficiently Active (09/08/2023)   Exercise Vital Sign    Days of Exercise per Week: 7 days    Minutes of Exercise per Session: 30 min  Stress: Stress Concern Present (09/08/2023)   [employer] of Occupational Health - Occupational Stress Questionnaire    Feeling of Stress : To some extent  Social Connections: Moderately Isolated (10/29/2022)   Social Connection and Isolation Panel    Frequency of Communication with Friends and Family: More than three times a week    Frequency of Social Gatherings with Friends and Family: More than three times a week    Attends Religious Services: Never    Database Administrator or Organizations: No    Attends Banker Meetings: Never    Marital Status: Living with partner  Depression (PHQ2-9): Low Risk (09/13/2024)   Depression (PHQ2-9)    PHQ-2 Score: 1  Alcohol Screen: Low Risk (09/08/2023)   Alcohol Screen    Last Alcohol Screening Score (AUDIT): 0  Housing: Low Risk  (10/29/2022)   Housing    Last Housing Risk Score: 0  Utilities: Not At Risk (10/29/2022)   AHC Utilities    Threatened with loss of utilities: No  Health Literacy: Adequate Health Literacy (09/08/2023)   B1300 Health Literacy    Frequency of need for help with medical instructions: Never    Outpatient Medications Prior to Visit  Medication Sig Dispense Refill   acetaminophen  (TYLENOL ) 500 MG tablet Take 500 mg by mouth every 6 (six) hours as needed for moderate pain.     Ascorbic Acid (VITAMIN C) 1000 MG tablet Take 1,000 mg by mouth daily.     carboxymethylcellulose (REFRESH PLUS) 0.5 % SOLN 1-2  drops 3 (three) times daily as needed (dry/irritated eyes.).     Cholecalciferol (VITAMIN D ) 2000 UNITS tablet Take 2,000 Units by mouth in the morning.     ELIQUIS  5 MG TABS tablet TAKE 1 TABLET TWICE DAILY 180 tablet 3   metoprolol  succinate (TOPROL -XL) 50 MG 24 hr tablet Take 1 tablet (50 mg total) by mouth in the morning. Take with or immediately following a meal. 90 tablet 3   Multiple Vitamins-Minerals (LUTEIN-ZEAXANTHIN PO) Take 1 tablet by mouth in the morning.     No facility-administered medications prior to visit.     EXAM:  BP 114/62 (BP Location: Left Arm, Patient Position: Sitting, Cuff Size: Normal)   Pulse (!) 53   Temp 97.7 F (36.5 C) (Oral)   Ht 5' 3.54 (1.614 m)   Wt 137 lb 6.4 oz (62.3 kg)   SpO2 98%   BMI 23.93 kg/m   Body mass index is 23.93 kg/m. Wt Readings from Last 3 Encounters:  09/13/24 137 lb 6.4 oz (62.3 kg)  08/15/24 135 lb 9.6 oz (61.5 kg)  06/30/24 135 lb 3.2 oz (61.3 kg)    Physical Exam: Vital signs reviewed HZW:Uypd is a well-developed well-nourished alert cooperative    who appearsr stated age in no acute distress.  HEENT: normocephalic atraumatic , Eyes: PERRL EOM's full, conjunctiva clear, Nares: paten,t no deformity discharge or tenderness., Ears: no deformity EAC's clear TMs with normal landmarks. Mouth: clear OP, no lesions, edema.   Moist mucous membranes. Dentition in adequate repair. NECK: supple without masses, thyromegaly or bruits. CHEST/PULM:  Clear to auscultation and percussion breath sounds equal no wheeze , rales or rhonchi. No chest wall deformities or tenderness. Breast: normal by inspection . No dimpling, discharge, masses, tenderness or discharge . CV: PMI is nondisplaced, S1 S2 no gallops, murmurs, rubs. Peripheral pulses are full without delay.No JVD .  ABDOMEN: Bowel sounds normal nontender  No guard or rebound, no hepato splenomegal no CVA tenderness.   Extremtities:  No clubbing cyanosis or edema, no acute joint swelling or redness no focal atrophy NEURO:  Oriented x3, cranial nerves 3-12 appear to be intact, no obvious focal weakness,gait within normal limits no abnormal reflexes or asymmetrical SKIN: No acute rashes normal turgor, color, no bruising or petechiae. PSYCH: Oriented, good eye contact, no obvious depression anxiety, cognition and judgment appear normal. LN: no cervical axillary adenopathy  Lab Results  Component Value Date   WBC 5.1 08/15/2024   HGB 12.0 08/15/2024   HCT 37.0 08/15/2024   PLT 279 08/15/2024   GLUCOSE 93 08/15/2024   CHOL 208 (H) 09/08/2023   TRIG 74.0 09/08/2023   HDL 60.30 09/08/2023   LDLDIRECT 160.3 10/13/2011   LDLCALC 133 (H) 09/08/2023   ALT 26 09/08/2023   AST 27 09/08/2023   NA 137 08/15/2024   K 4.4 08/15/2024   CL 98 08/15/2024   CREATININE 0.67 08/15/2024   BUN 12 08/15/2024   CO2 28 08/15/2024   TSH 2.11 09/08/2023   HGBA1C 5.9 09/08/2023    BP Readings from Last 3 Encounters:  09/13/24 114/62  08/19/24 97/69  08/15/24 104/84    Lab plan reviewed with patient  not updating since not tkinag a lipid medication  and has had recent labs per cards   ASSESSMENT AND PLAN:  Discussed the following assessment and plan:    ICD-10-CM   1. Visit for preventive health examination  Z00.00     2. Atypical atrial flutter (HCC)  I48.4  hx of cv   and ablations    3. Hyperlipidemia, unspecified hyperlipidemia type  E78.5     4. Anticoagulant long-term use  Z79.01     5. Screen for colon cancer  Z12.11 Cologuard    Disc    reasonable to  proceed with medication since not a candidate for other  ablations Is well   and disc screenings .  Healthy  for age  Return in about 1 year (around 09/13/2025) for or as needed .  Patient Care Team: Kymiah Araiza, Apolinar POUR, MD as PCP - General Jeffrie Oneil BROCKS, MD as PCP - Cardiology (Cardiology) Cindie Ole DASEN, MD as PCP - Electrophysiology (Cardiology) Dr. Juana as Attending Physician (Ophthalmology) Patient Instructions  Good to see you today  Will order cologuard .  Yearly check  Continue lifestyle intervention healthy eating and exercise . As planned   Yee Gangi K. Mirissa Lopresti M.D.  "

## 2024-09-13 ENCOUNTER — Other Ambulatory Visit: Payer: Self-pay

## 2024-09-13 ENCOUNTER — Encounter: Payer: Self-pay | Admitting: Internal Medicine

## 2024-09-13 ENCOUNTER — Ambulatory Visit (INDEPENDENT_AMBULATORY_CARE_PROVIDER_SITE_OTHER): Payer: Medicare PPO | Admitting: Internal Medicine

## 2024-09-13 VITALS — BP 114/62 | HR 53 | Temp 97.7°F | Ht 63.54 in | Wt 137.4 lb

## 2024-09-13 DIAGNOSIS — E785 Hyperlipidemia, unspecified: Secondary | ICD-10-CM

## 2024-09-13 DIAGNOSIS — I2510 Atherosclerotic heart disease of native coronary artery without angina pectoris: Secondary | ICD-10-CM

## 2024-09-13 DIAGNOSIS — Z0000 Encounter for general adult medical examination without abnormal findings: Secondary | ICD-10-CM | POA: Diagnosis not present

## 2024-09-13 DIAGNOSIS — Z1211 Encounter for screening for malignant neoplasm of colon: Secondary | ICD-10-CM | POA: Diagnosis not present

## 2024-09-13 DIAGNOSIS — I484 Atypical atrial flutter: Secondary | ICD-10-CM | POA: Diagnosis not present

## 2024-09-13 DIAGNOSIS — Z7901 Long term (current) use of anticoagulants: Secondary | ICD-10-CM

## 2024-09-13 DIAGNOSIS — I4891 Unspecified atrial fibrillation: Secondary | ICD-10-CM

## 2024-09-13 DIAGNOSIS — M47812 Spondylosis without myelopathy or radiculopathy, cervical region: Secondary | ICD-10-CM

## 2024-09-13 NOTE — Patient Instructions (Signed)
 Good to see you today  Will order cologuard .  Yearly check  Continue lifestyle intervention healthy eating and exercise . As planned

## 2024-09-14 ENCOUNTER — Other Ambulatory Visit: Payer: Self-pay

## 2024-09-16 ENCOUNTER — Encounter (HOSPITAL_COMMUNITY): Payer: Self-pay

## 2024-09-19 ENCOUNTER — Other Ambulatory Visit: Payer: Self-pay

## 2024-09-19 ENCOUNTER — Encounter (HOSPITAL_COMMUNITY): Payer: Self-pay | Admitting: Student in an Organized Health Care Education/Training Program

## 2024-09-19 ENCOUNTER — Inpatient Hospital Stay (HOSPITAL_COMMUNITY)
Admission: AD | Admit: 2024-09-19 | Discharge: 2024-09-21 | DRG: 309 | Disposition: A | Discharge status: Home / Self Care | Source: Ambulatory Visit | Attending: Student in an Organized Health Care Education/Training Program | Admitting: Student in an Organized Health Care Education/Training Program

## 2024-09-19 ENCOUNTER — Telehealth: Payer: Self-pay | Admitting: Family Medicine

## 2024-09-19 ENCOUNTER — Ambulatory Visit (HOSPITAL_COMMUNITY)
Admission: RE | Admit: 2024-09-19 | Discharge: 2024-09-19 | Disposition: A | Discharge status: Home / Self Care | Source: Ambulatory Visit | Attending: Physician Assistant | Admitting: Physician Assistant

## 2024-09-19 VITALS — BP 130/78 | HR 80 | Ht 63.54 in | Wt 138.0 lb

## 2024-09-19 DIAGNOSIS — I4819 Other persistent atrial fibrillation: Secondary | ICD-10-CM | POA: Diagnosis present

## 2024-09-19 DIAGNOSIS — I4891 Unspecified atrial fibrillation: Secondary | ICD-10-CM

## 2024-09-19 DIAGNOSIS — Z7901 Long term (current) use of anticoagulants: Secondary | ICD-10-CM

## 2024-09-19 DIAGNOSIS — D6869 Other thrombophilia: Secondary | ICD-10-CM | POA: Diagnosis present

## 2024-09-19 DIAGNOSIS — Z87442 Personal history of urinary calculi: Secondary | ICD-10-CM

## 2024-09-19 DIAGNOSIS — Z860100 Personal history of colon polyps, unspecified: Secondary | ICD-10-CM

## 2024-09-19 DIAGNOSIS — E785 Hyperlipidemia, unspecified: Secondary | ICD-10-CM | POA: Diagnosis present

## 2024-09-19 DIAGNOSIS — Z8616 Personal history of COVID-19: Secondary | ICD-10-CM | POA: Diagnosis not present

## 2024-09-19 DIAGNOSIS — I4892 Unspecified atrial flutter: Secondary | ICD-10-CM | POA: Diagnosis present

## 2024-09-19 DIAGNOSIS — I2510 Atherosclerotic heart disease of native coronary artery without angina pectoris: Secondary | ICD-10-CM | POA: Diagnosis present

## 2024-09-19 DIAGNOSIS — I10 Essential (primary) hypertension: Secondary | ICD-10-CM | POA: Diagnosis present

## 2024-09-19 DIAGNOSIS — I484 Atypical atrial flutter: Principal | ICD-10-CM

## 2024-09-19 DIAGNOSIS — Z79899 Other long term (current) drug therapy: Secondary | ICD-10-CM | POA: Diagnosis not present

## 2024-09-19 DIAGNOSIS — M25511 Pain in right shoulder: Secondary | ICD-10-CM | POA: Diagnosis present

## 2024-09-19 LAB — BASIC METABOLIC PANEL WITH GFR
BUN/Creatinine Ratio: 21 (ref 12–28)
BUN: 15 mg/dL (ref 8–27)
CO2: 27 mmol/L (ref 20–29)
Calcium: 9.3 mg/dL (ref 8.7–10.3)
Chloride: 101 mmol/L (ref 96–106)
Creatinine, Ser: 0.72 mg/dL (ref 0.57–1.00)
Glucose: 95 mg/dL (ref 70–99)
Potassium: 5.6 mmol/L — ABNORMAL HIGH (ref 3.5–5.2)
Sodium: 137 mmol/L (ref 134–144)
eGFR: 84 mL/min/1.73

## 2024-09-19 LAB — MAGNESIUM: Magnesium: 2.1 mg/dL (ref 1.6–2.3)

## 2024-09-19 MED ORDER — DOFETILIDE 500 MCG PO CAPS
500.0000 ug | ORAL_CAPSULE | Freq: Two times a day (BID) | ORAL | Status: DC
  Administered 2024-09-19 – 2024-09-20 (×2): 500 ug via ORAL
  Filled 2024-09-19 (×2): qty 1

## 2024-09-19 MED ORDER — ACETAMINOPHEN 500 MG PO TABS: 500.0000 mg | ORAL_TABLET | Freq: Four times a day (QID) | ORAL | Status: DC | PRN

## 2024-09-19 MED ORDER — METOPROLOL SUCCINATE ER 50 MG PO TB24
50.0000 mg | ORAL_TABLET | Freq: Every day | ORAL | Status: DC
  Administered 2024-09-20: 50 mg via ORAL
  Filled 2024-09-19: qty 1

## 2024-09-19 MED ORDER — SODIUM CHLORIDE 0.9% FLUSH: 3.0000 mL | INTRAVENOUS | Status: DC | PRN

## 2024-09-19 MED ORDER — APIXABAN 5 MG PO TABS
5.0000 mg | ORAL_TABLET | Freq: Two times a day (BID) | ORAL | Status: DC
  Administered 2024-09-19 – 2024-09-21 (×4): 5 mg via ORAL
  Filled 2024-09-19 (×4): qty 1

## 2024-09-19 MED ORDER — SODIUM CHLORIDE 0.9% FLUSH
3.0000 mL | Freq: Two times a day (BID) | INTRAVENOUS | Status: DC
  Administered 2024-09-20 – 2024-09-21 (×2): 3 mL via INTRAVENOUS

## 2024-09-19 MED ORDER — NAPROXEN 250 MG PO TABS
500.0000 mg | ORAL_TABLET | Freq: Two times a day (BID) | ORAL | Status: DC
  Administered 2024-09-19 – 2024-09-21 (×4): 500 mg via ORAL
  Filled 2024-09-19 (×6): qty 2

## 2024-09-19 MED ORDER — SODIUM CHLORIDE 0.9 % IV SOLN: 250.0000 mL | INTRAVENOUS | Status: AC | PRN

## 2024-09-19 NOTE — Telephone Encounter (Signed)
 Patient called and her right upper shoulder is in a lot of pain. She was looking for an appointment this week. Patient is on cancellation list. Patient is aware of the orthopaedic same day injury clinic through [facility] as well.  FYI.

## 2024-09-19 NOTE — Progress Notes (Signed)
 "   Primary Care Physician: Charlett Apolinar POUR, MD Primary Cardiologist: Dr Jeffrie Primary Electrophysiologist: Dr Cindie (previously) Referring Physician: Dr Jeffrie Nichole Berg is a 79 y.o. female with a history of HTN, HLD, CAD, and atrial fibrillation who presents for follow up in the [facility]. The patient was initially diagnosed with atrial fibrillation 06/25/20 after presenting to urgent care with symptoms of dizziness and palpitations. Patient is on Eliquis  for stroke prevention. She underwent DCCV on 08/15/20. Unfortunately, she felt she was back in afib with palpitations about 3 weeks later. Patient is s/p afib ablation with Dr Cindie on 12/10/20.   On 03/16/22 she noted an elevated heart rate with tachypalpitations. She was in atrial flutter and underwent DCCV on 04/10/22. She was seen by Dr Cindie on 05/02/22 and watchful waiting was pursued at that time. She had increased burden of atrial flutter and is s/p repeat ablation 12/12/22. Patient is s/p DCCV on 09/03/23. She noted an elevated HR on 04/20/24 while camping in [place] and was found to be in atrial flutter, s/p DCCV on 05/16/24. In hindsight, she did have some wine on her trip and wonders if this could have been her trigger.  Patient returns for follow up for atrial fibrillation and dofetilide  loading. She is s/p DCCV on 08/19/24 but is back in atrial flutter. She denies any missed doses of anticoagulation in the past 3 weeks. She does have right should pain which has been ongoing for a few days. She has been using tylenol  and ice packs. She previously injured that shoulder and has had injections before.   Today, she  denies symptoms of palpitations, chest pain, shortness of breath, orthopnea, PND, lower extremity edema, dizziness, presyncope, syncope, snoring, daytime somnolence, bleeding, or neurologic sequela. The patient is tolerating medications without difficulties and is otherwise without complaint today.     Atrial Fibrillation Risk Factors:  she does not have symptoms or diagnosis of sleep apnea. she does not have a history of rheumatic fever. she does not have a history of alcohol use. The patient does have a history of early familial atrial fibrillation or other arrhythmias. Son has afib.   Atrial Fibrillation Management history:  Previous antiarrhythmic drugs: none Previous cardioversions: 08/15/20, 04/10/22, 09/03/23 Previous ablations: 12/10/20, 11/04/22 Anticoagulation history: Eliquis    Past Medical History:  Diagnosis Date   Cataract 10/18/2011   Followed by opthalmology.    COLONIC POLYPS, HX OF 10/12/2009   COVID-19 03/2020   HYPERLIPIDEMIA 05/31/2007   NEPHROLITHIASIS, HX OF 05/31/2007    Current Outpatient Medications  Medication Sig Dispense Refill   acetaminophen  (TYLENOL ) 500 MG tablet Take 500 mg by mouth every 6 (six) hours as needed for moderate pain. (Patient taking differently: Take 1,300 mg by mouth 2 (two) times daily.)     carboxymethylcellulose (REFRESH PLUS) 0.5 % SOLN 1-2 drops 3 (three) times daily as needed (dry/irritated eyes.). (Patient taking differently: 1-2 drops as needed (dry/irritated eyes.).)     Cholecalciferol (VITAMIN D ) 2000 UNITS tablet Take 2,000 Units by mouth in the morning.     ELIQUIS  5 MG TABS tablet TAKE 1 TABLET TWICE DAILY 180 tablet 3   metoprolol  succinate (TOPROL -XL) 50 MG 24 hr tablet Take 1 tablet (50 mg total) by mouth in the morning. Take with or immediately following a meal. 90 tablet 3   Multiple Vitamins-Minerals (LUTEIN-ZEAXANTHIN PO) Take 1 tablet by mouth in the morning.     Ascorbic Acid (VITAMIN C) 1000 MG tablet Take  1,000 mg by mouth daily. (Patient not taking: Reported on 09/19/2024)     No current facility-administered medications for this encounter.    ROS- All systems are reviewed and negative except as per the HPI above.  Physical Exam: Vitals:   09/19/24 0827  BP: 130/78  Pulse: 80  Weight: 62.6 kg  Height:  5' 3.54 (1.614 m)    GEN: Well nourished, well developed in no acute distress CARDIAC: Irregularly irregular rate and rhythm, no murmurs, rubs, gallops RESPIRATORY:  Clear to auscultation without rales, wheezing or rhonchi  ABDOMEN: Soft, non-tender, non-distended EXTREMITIES:  No edema; No deformity    Wt Readings from Last 3 Encounters:  09/19/24 62.6 kg  09/13/24 62.3 kg  08/15/24 61.5 kg    EKG Interpretation Date/Time:  Monday September 19 2024 08:38:28 EST Ventricular Rate:  80 PR Interval:    QRS Duration:  88 QT Interval:  370 QTC Calculation: 426 R Axis:   54  Text Interpretation: Atrial flutter with variable A-V block Nonspecific T wave abnormality Abnormal ECG When compared with ECG of 19-Aug-2024 11:20, Atrial flutter has replaced Sinus rhythm Confirmed by Stepehn Eckard ([id number]) on 09/19/2024 8:53:24 AM    Echo 08/10/20 demonstrated  1. Left ventricular ejection fraction, by estimation, is 60 to 65%. The  left ventricle has normal function. The left ventricle has no regional  wall motion abnormalities. Left ventricular diastolic parameters are  indeterminate.   2. Right ventricular systolic function is normal. The right ventricular  size is normal. There is normal pulmonary artery systolic pressure.   3. Left atrial size was moderately dilated.   4. Right atrial size was mildly dilated.   5. The mitral valve is degenerative. Mild mitral valve regurgitation. No  evidence of mitral stenosis. Moderate mitral annular calcification.   6. Tricuspid valve regurgitation is mild to moderate.   7. The aortic valve is tricuspid. Aortic valve regurgitation is not  visualized. Mild to moderate aortic valve sclerosis/calcification is  present, without any evidence of aortic stenosis.   8. The inferior vena cava is normal in size with greater than 50%  respiratory variability, suggesting right atrial pressure of 3 mmHg.  Epic records are reviewed at length today   CHA2DS2-VASc  Score = 5  The patient's score is based upon: CHF History: 0 HTN History: 1 Diabetes History: 0 Stroke History: 0 Vascular Disease History: 1 Age Score: 2 Gender Score: 1       ASSESSMENT AND PLAN: Persistent Atrial Fibrillation/atrial flutter (ICD10:  I48.19) The patient's CHA2DS2-VASc score is 5, indicating a 7.2% annual risk of stroke.   S/p afib ablation 12/10/20 and 12/12/22 Patient presents for dofetilide  admission Continue Eliquis  5 mg BID, states no missed doses in the last 3 weeks. No recent benadryl  use PharmD has screened medications for QT prolonging agents.  QTc in SR 442 ms Labs today show creatinine at 0.72, K+ 5.6 and mag 2.1, CrCl calculated at 62 mL/min Continue Toprol  50 mg daily  Secondary Hypercoagulable State (ICD10:  D68.69) The patient is at significant risk for stroke/thromboembolism based upon her CHA2DS2-VASc Score of 5.  Continue Apixaban  (Eliquis ). No bleeding issues.   HTN Stable on current regimen  CAD No anginal symptoms Followed by Dr Jeffrie   To be admitted later today once a bed becomes available.    Daril Kicks PA-C [facility] [address] [place], [place] [address] [phone] 09/19/2024 10:58 AM "

## 2024-09-19 NOTE — Progress Notes (Signed)
 Post Tikosyn  EKG shows NSR HR 60, Qtc 480. Per CCMD pt converted approx 2140

## 2024-09-19 NOTE — H&P (Signed)
 "  Electrophysiology H&P  Note    Primary Care Physician: Charlett Apolinar POUR, MD Primary Cardiologist: Dr Jeffrie Primary Electrophysiologist: Dr Cindie (previously) Referring Physician: Dr Jeffrie Nichole Berg is a 79 y.o. female with a history of HTN, HLD, CAD, and atrial fibrillation who presents for follow up in the [facility]. The patient was initially diagnosed with atrial fibrillation 06/25/20 after presenting to urgent care with symptoms of dizziness and palpitations. Patient is on Eliquis  for stroke prevention. She underwent DCCV on 08/15/20. Unfortunately, she felt she was back in afib with palpitations about 3 weeks later. Patient is s/p afib ablation with Dr Cindie on 12/10/20.   On 03/16/22 she noted an elevated heart rate with tachypalpitations. She was in atrial flutter and underwent DCCV on 04/10/22. She was seen by Dr Cindie on 05/02/22 and watchful waiting was pursued at that time. She had increased burden of atrial flutter and is s/p repeat ablation 12/12/22. Patient is s/p DCCV on 09/03/23. She noted an elevated HR on 04/20/24 while camping in [place] and was found to be in atrial flutter, s/p DCCV on 05/16/24. In hindsight, she did have some wine on her trip and wonders if this could have been her trigger.  Patient returns for follow up for atrial fibrillation and dofetilide  loading. She is s/p DCCV on 08/19/24 but is back in atrial flutter. She denies any missed doses of anticoagulation in the past 3 weeks. She does have right should pain which has been ongoing for a few days. She has been using tylenol  and ice packs. She previously injured that shoulder and has had injections before.   Today, she  denies symptoms of palpitations, chest pain, shortness of breath, orthopnea, PND, lower extremity edema, dizziness, presyncope, syncope, snoring, daytime somnolence, bleeding, or neurologic sequela. The patient is tolerating medications without difficulties and is otherwise  without complaint today.    Atrial Fibrillation Risk Factors:  she does not have symptoms or diagnosis of sleep apnea. she does not have a history of rheumatic fever. she does not have a history of alcohol use. The patient does have a history of early familial atrial fibrillation or other arrhythmias. Son has afib.   Atrial Fibrillation Management history:  Previous antiarrhythmic drugs: none Previous cardioversions: 08/15/20, 04/10/22, 09/03/23 Previous ablations: 12/10/20, 11/04/22 Anticoagulation history: Eliquis    Past Medical History:  Diagnosis Date   Cataract 10/18/2011   Followed by opthalmology.    COLONIC POLYPS, HX OF 10/12/2009   COVID-19 03/2020   HYPERLIPIDEMIA 05/31/2007   NEPHROLITHIASIS, HX OF 05/31/2007    No current facility-administered medications for this encounter.   Current Outpatient Medications  Medication Sig Dispense Refill   acetaminophen  (TYLENOL ) 500 MG tablet Take 500 mg by mouth every 6 (six) hours as needed for moderate pain. (Patient taking differently: Take 1,300 mg by mouth 2 (two) times daily.)     Ascorbic Acid (VITAMIN C) 1000 MG tablet Take 1,000 mg by mouth daily. (Patient not taking: Reported on 09/19/2024)     carboxymethylcellulose (REFRESH PLUS) 0.5 % SOLN 1-2 drops 3 (three) times daily as needed (dry/irritated eyes.). (Patient taking differently: 1-2 drops as needed (dry/irritated eyes.).)     Cholecalciferol (VITAMIN D ) 2000 UNITS tablet Take 2,000 Units by mouth in the morning.     ELIQUIS  5 MG TABS tablet TAKE 1 TABLET TWICE DAILY 180 tablet 3   metoprolol  succinate (TOPROL -XL) 50 MG 24 hr tablet Take 1 tablet (50 mg total) by mouth in  the morning. Take with or immediately following a meal. 90 tablet 3   Multiple Vitamins-Minerals (LUTEIN-ZEAXANTHIN PO) Take 1 tablet by mouth in the morning.      ROS- All systems are reviewed and negative except as per the HPI above.  Physical Exam: There were no vitals filed for this visit.   GEN:  Well nourished, well developed in no acute distress CARDIAC: Irregularly irregular rate and rhythm, no murmurs, rubs, gallops RESPIRATORY:  Clear to auscultation without rales, wheezing or rhonchi  ABDOMEN: Soft, non-tender, non-distended EXTREMITIES:  No edema; No deformity    Wt Readings from Last 3 Encounters:  09/19/24 62.6 kg  09/13/24 62.3 kg  08/15/24 61.5 kg         Echo 08/10/20 demonstrated  1. Left ventricular ejection fraction, by estimation, is 60 to 65%. The  left ventricle has normal function. The left ventricle has no regional  wall motion abnormalities. Left ventricular diastolic parameters are  indeterminate.   2. Right ventricular systolic function is normal. The right ventricular  size is normal. There is normal pulmonary artery systolic pressure.   3. Left atrial size was moderately dilated.   4. Right atrial size was mildly dilated.   5. The mitral valve is degenerative. Mild mitral valve regurgitation. No  evidence of mitral stenosis. Moderate mitral annular calcification.   6. Tricuspid valve regurgitation is mild to moderate.   7. The aortic valve is tricuspid. Aortic valve regurgitation is not  visualized. Mild to moderate aortic valve sclerosis/calcification is  present, without any evidence of aortic stenosis.   8. The inferior vena cava is normal in size with greater than 50%  respiratory variability, suggesting right atrial pressure of 3 mmHg.  Epic records are reviewed at length today   CHA2DS2-VASc Score = 5  The patient's score is based upon: CHF History: 0 HTN History: 1 Diabetes History: 0 Stroke History: 0 Vascular Disease History: 1 Age Score: 2 Gender Score: 1       ASSESSMENT AND PLAN: Persistent Atrial Fibrillation/atrial flutter (ICD10:  I48.19) The patient's CHA2DS2-VASc score is 5, indicating a 7.2% annual risk of stroke.   S/p afib ablation 12/10/20 and 12/12/22 Patient presents for dofetilide  admission Continue Eliquis  5 mg  BID, states no missed doses in the last 3 weeks. No recent benadryl  use PharmD has screened medications for QT prolonging agents.  QTc in SR 442 ms Labs today show creatinine at 0.72, K+ 5.6 and mag 2.1, CrCl calculated at 62 mL/min Continue Toprol  50 mg daily  Secondary Hypercoagulable State (ICD10:  D68.69) The patient is at significant risk for stroke/thromboembolism based upon her CHA2DS2-VASc Score of 5.  Continue Apixaban  (Eliquis ). No bleeding issues.   HTN Stable on current regimen  CAD No anginal symptoms Followed by Dr Jeffrie   Pt presents for tikosyn  admission as above.   Nichole Jodie Passey, PA-C  09/19/2024 4:21 PM  "

## 2024-09-19 NOTE — Progress Notes (Signed)
 Pharmacy: Dofetilide  (Tikosyn ) - Initial Consult Assessment and Electrolyte Replacement  Pharmacy consulted to assist in monitoring and replacing electrolytes in this 79 y.o. female admitted on 09/19/2024 undergoing dofetilide  initiation. First dofetilide  dose: 1/12 2000  Assessment:  Patient Exclusion Criteria: If any screening criteria checked as Yes, then  patient  should NOT receive dofetilide  until criteria item is corrected.  If Yes please indicate correction plan.  YES  NO Patient  Exclusion Criteria Correction Plan/Comments   []   [x]   Baseline QTc interval is greater than or equal to 440 msec. IF above YES box checked dofetilide  contraindicated unless patient has ICD; then may proceed if QTc 500-550 msec or with known ventricular conduction abnormalities may proceed with QTc 550-600 msec. QTc =      []   [x]   Patient is known or suspected to have a digoxin level greater than 2 ng/ml: No results found for: DIGOXIN     []   [x]   Creatinine clearance less than 20 ml/min (calculated using Cockcroft-Gault, actual body weight and serum creatinine): Estimated Creatinine Clearance: 47.5 mL/min (by C-G formula based on SCr of 0.72 mg/dL).     []   [x]  Patient has received drugs known to prolong the QT intervals within the last 48 hour (examples: phenothiazines, tricyclics or tetracyclic antidepressants, macrolides, 1st generation H-1 antihistamines (especially diphenhydramine ), fluoroquinolones, azoles, ondansetron , metoclopramide, promethazine).   Updated information on QT prolonging agents is available to be searched on the following database:QT prolonging agents -If SSRI or antihistamine needed, preferred options are sertraline and loratadine respectively     []   [x]  Patient received a dose of a thiazide diuretic in the last 48 hours [including hydrochlorothiazide (Oretic) alone or in any combination including triamterene (Dyazide, Maxzide)].    []   [x]  Patient received a  medication known to increase dofetilide  plasma concentrations prior to initial dofetilide  dose:  Trimethoprim (Primsol, Proloprim) in the last 36 hours Verapamil (Calan, Verelan) in the last 36 hours or a sustained release dose in the last 72 hours Megestrol (Megace) in the last 5 days  Cimetidine (Tagamet) in the last 6 hours Ketoconazole (Nizoral) in the last 24 hours Itraconazole (Sporanox) in the last 48 hours  Prochlorperazine (Compazine) in the last 36 hours     []   [x]   Patient is known to have a history of torsades de pointes; congenital or acquired long QT syndromes.    []   [x]   Patient has received a Class 1 and Class 3 antiarrhythmic with less than 2 half-lives since last dose. (Disopyramide, Quinidine, Procainamide, Lidocaine , Mexiletine, Flecainide, Propafenone, Sotalol, Dronedarone )    []   [x]   Patient has received amiodarone therapy in the past 3 months or amiodarone level is greater than 0.3 ng/ml.    Labs:    Component Value Date/Time   K 5.6 (H) 09/19/2024 0840   MG 2.1 09/19/2024 0840     Plan: [facility] One Calculated CrCl  Dose q12h  [x]  > 60 ml/min 500 mcg  []  40-60 ml/min 250 mcg  []  20-40 ml/min 125 mcg   [x]   Physician selected initial dose within range recommended for patients level of renal function - will monitor for response.  []   Physician selected initial dose outside of range recommended for patients level of renal function - will discuss if the dose should be altered at this time.   Patient has been appropriately anticoagulated with apixaban .  Potassium: K >/= 4: Appropriate to initiate Tikosyn , no replacement needed    Magnesium : Mg >2: Appropriate to  initiate Tikosyn , no replacement needed     Thank you for allowing pharmacy to participate in this patient's care   Vito Ralph, PharmD, BCPS Please see amion for complete clinical pharmacist phone list 09/19/2024  5:08 PM

## 2024-09-19 NOTE — Progress Notes (Signed)
" °   09/19/24 2012  Assess: MEWS Score  Temp (!) 97.4 F (36.3 C)  BP (!) 145/85  MAP (mmHg) 103  ECG Heart Rate (!) 122  Resp 15  Level of Consciousness Alert  Assess: MEWS Score  MEWS Temp 0  MEWS Systolic 0  MEWS Pulse 2  MEWS RR 0  MEWS LOC 0  MEWS Score 2  MEWS Score Color Yellow  Assess: if the MEWS score is Yellow or Red  Were vital signs accurate and taken at a resting state? Yes  Does the patient meet 2 or more of the SIRS criteria? No  MEWS guidelines implemented   (yellow Mews driven by HR 120's. Pt is in AFIB and in for Tikosyn  load. Mews not implemented at this time)  Assess: SIRS CRITERIA  SIRS Temperature  0  SIRS Respirations  0  SIRS Pulse 1  SIRS WBC 0  SIRS Score Sum  1    "

## 2024-09-20 ENCOUNTER — Telehealth (HOSPITAL_COMMUNITY): Payer: Self-pay

## 2024-09-20 ENCOUNTER — Other Ambulatory Visit (HOSPITAL_COMMUNITY): Payer: Self-pay

## 2024-09-20 LAB — BASIC METABOLIC PANEL WITH GFR
Anion gap: 8 (ref 5–15)
BUN: 15 mg/dL (ref 8–23)
CO2: 26 mmol/L (ref 22–32)
Calcium: 8.9 mg/dL (ref 8.9–10.3)
Chloride: 100 mmol/L (ref 98–111)
Creatinine, Ser: 0.66 mg/dL (ref 0.44–1.00)
GFR, Estimated: >60 mL/min
Glucose, Bld: 86 mg/dL (ref 70–99)
Potassium: 4.9 mmol/L (ref 3.5–5.1)
Sodium: 134 mmol/L — ABNORMAL LOW (ref 135–145)

## 2024-09-20 LAB — MAGNESIUM: Magnesium: 1.9 mg/dL (ref 1.7–2.4)

## 2024-09-20 MED ORDER — METOPROLOL SUCCINATE ER 25 MG PO TB24: 25.0000 mg | ORAL_TABLET | Freq: Every day | ORAL | Status: DC

## 2024-09-20 MED ORDER — PANTOPRAZOLE SODIUM 40 MG PO TBEC
40.0000 mg | DELAYED_RELEASE_TABLET | Freq: Every day | ORAL | Status: DC
  Administered 2024-09-20 – 2024-09-21 (×2): 40 mg via ORAL
  Filled 2024-09-20 (×2): qty 1

## 2024-09-20 MED ORDER — DOFETILIDE 250 MCG PO CAPS: 250.0000 ug | ORAL_CAPSULE | Freq: Two times a day (BID) | ORAL | Status: DC

## 2024-09-20 MED ORDER — MAGNESIUM SULFATE 2 GM/50ML IV SOLN
2.0000 g | Freq: Once | INTRAVENOUS | Status: AC
  Administered 2024-09-20: 2 g via INTRAVENOUS
  Filled 2024-09-20: qty 50

## 2024-09-20 NOTE — TOC CM/SW Note (Signed)
 Transition of Care [facility]) - Inpatient Brief Assessment   Patient Details  Name: Nichole Berg MRN: [id number] Date of Birth: Aug 17, 1946  [facility]) CM/SW Contact:    Sudie Erminio Deems, RN Phone Number: 09/20/2024, 12:31 PM   Clinical Narrative: Patient presented for Tikosyn  Load. Inpatient Case Manager spoke with the patient regarding co pay cost. Patient is agreeable to cost and would like to have the initial Rx filled via [facility] and the Rx refills 90 day supply escribed to [facility]. No further needs identified at this time.   Transition of Care Asessment: Insurance and Status: Insurance coverage has been reviewed Patient has primary care physician: Yes Home environment has been reviewed: reviewed Prior level of function:: independent Prior/Current Home Services: No current home services Social Drivers of Health Review: SDOH reviewed no interventions necessary Readmission risk has been reviewed: Yes Transition of care needs: no transition of care needs at this time

## 2024-09-20 NOTE — Progress Notes (Signed)
" ° °  Electrophysiology Rounding Note  Patient Name: Nichole Berg Date of Encounter: 09/20/2024  Primary Cardiologist: [name], MD  Electrophysiologist: OLE ONEIDA HOLTS, MD (Inactive)    Subjective   Pt converted to sinus rhythm on Tikosyn  500 mcg BID   QTc from EKG last pm shows borderline QTc at ~480,s  The patient is doing well today.  At this time, the patient denies chest pain, shortness of breath, or any new concerns.  Inpatient Medications    Scheduled Meds:  apixaban   5 mg Oral BID   dofetilide   500 mcg Oral BID   metoprolol  succinate  50 mg Oral Daily   naproxen   500 mg Oral BID WC   sodium chloride  flush  3 mL Intravenous Q12H   Continuous Infusions:  sodium chloride      magnesium  sulfate bolus IVPB     PRN Meds: sodium chloride , acetaminophen , sodium chloride  flush   Vital Signs    Vitals:   09/19/24 1721 09/19/24 2012 09/19/24 2340 09/20/24 0455  BP: (!) 144/88 (!) 145/85 122/61 (!) 123/57  Pulse: (!) 115  (!) 58 60  Resp: 16 15 16 15   Temp: 97.7 F (36.5 C) (!) 97.4 F (36.3 C) 97.7 F (36.5 C) 98.2 F (36.8 C)  TempSrc: Oral Oral Oral Oral  SpO2: 98%     Weight:      Height:       No intake or output data in the 24 hours ending 09/20/24 0728 Filed Weights   09/19/24 1600  Weight: 62.7 kg    Physical Exam    GEN- NAD, A&O x 3. Normal affect.  Lungs- CTAB, Normal effort.  Heart- Regular rate and rhythm. No M/G/R GI- Soft, NT, ND Extremities- No clubbing, cyanosis, or edema Skin- no rash or lesion  Labs    CBC No results for input(s): WBC, NEUTROABS, HGB, HCT, MCV, PLT in the last 72 hours. Basic Metabolic Panel Recent Labs    98/87/73 0840 09/20/24 0424  NA 137 134*  K 5.6* 4.9  CL 101 100  CO2 27 26  GLUCOSE 95 86  BUN 15 15  CREATININE 0.72 0.66  CALCIUM  9.3 8.9  MG 2.1 1.9    Telemetry    Converted to NSR around 2145 last night, now in sinus 50-70s (personally reviewed)  Patient Profile     Nichole Berg is a 79 y.o. female with a past medical history significant for persistent atrial fibrillation.  They were admitted for tikosyn  load.   Assessment & Plan    Persistent atrial fibrillation Pt converted to sinus rhythm on Tikosyn  500 mcg BID  Continue Eliquis  Creatinine, ser  0.66 (01/13 0424) Magnesium   1.9 (01/13 0424) Potassium4.9 (01/13 0424) Supplement Mg  Plan for home Thursday if QTc remains stable.  For questions or updates, please contact [facility] Please consult [email or web address] for contact info under Cardiology/STEMI.  Signed, Ozell Prentice Passey, PA-C  09/20/2024, 7:28 AM   "

## 2024-09-20 NOTE — Progress Notes (Signed)
 Morning EKG reviewed     Shows remains in NSR with prolonged QTc at >515 ms.   HOLD evening dose of Tikosyn .   EKG in am, and if QT stable will dose Tikosyn  at 250 and follow.     Potassium4.9 (01/13 0424) Magnesium   1.9 (01/13 0424) Creatinine, ser  0.66 (01/13 0424)  Will now need to stay until Friday am if ends up completing load, but can be discharged first thing Friday am.  Ozell Prentice Passey, PA-C  09/20/2024 11:32 AM

## 2024-09-20 NOTE — Progress Notes (Signed)
 Pharmacy: Dofetilide  (Tikosyn ) - Follow Up Assessment and Electrolyte Replacement  Pharmacy consulted to assist in monitoring and replacing electrolytes in this 79 y.o. female admitted on 09/19/2024 undergoing dofetilide  initiation. First dofetilide  dose: 500 mcg on 09/19/24 at 2025.  Labs:    Component Value Date/Time   K 4.9 09/20/2024 0424   MG 1.9 09/20/2024 0424     Plan: Potassium: K >/= 4: No additional supplementation needed  Magnesium : Mg 1.8-2: Give Mg 2 gm IV x1   Patient has been appropriately anticoagulated with apixaban .   Thank you for allowing pharmacy to participate in this patient's care   Morna Breach, PharmD, [facility] PGY2 Cardiology Pharmacy Resident 09/20/2024 6:56 AM

## 2024-09-20 NOTE — Telephone Encounter (Signed)
 Pharmacy Patient Advocate Encounter  Insurance verification completed.    The patient is insured through [place]. Patient has Medicare and is not eligible for a copay card, but may be able to apply for patient assistance or Medicare RX Payment Plan (Patient Must reach out to their plan, if eligible for payment plan), if available.    Ran test claim for Generic Tikosyn  500mcg tablet and the current 30 day co-pay is $10.   This test claim was processed through [facility]- copay amounts may vary at other pharmacies due to [employer], or as the patient moves through the different stages of their insurance plan.

## 2024-09-20 NOTE — Plan of Care (Signed)

## 2024-09-21 ENCOUNTER — Inpatient Hospital Stay (HOSPITAL_COMMUNITY)

## 2024-09-21 ENCOUNTER — Other Ambulatory Visit (HOSPITAL_COMMUNITY): Payer: Self-pay

## 2024-09-21 ENCOUNTER — Telehealth (HOSPITAL_COMMUNITY): Payer: Self-pay | Admitting: Pharmacy Technician

## 2024-09-21 DIAGNOSIS — I4891 Unspecified atrial fibrillation: Secondary | ICD-10-CM | POA: Diagnosis not present

## 2024-09-21 LAB — BASIC METABOLIC PANEL WITH GFR
Anion gap: 8 (ref 5–15)
BUN: 16 mg/dL (ref 8–23)
CO2: 26 mmol/L (ref 22–32)
Calcium: 9.2 mg/dL (ref 8.9–10.3)
Chloride: 101 mmol/L (ref 98–111)
Creatinine, Ser: 0.71 mg/dL (ref 0.44–1.00)
GFR, Estimated: >60 mL/min
Glucose, Bld: 87 mg/dL (ref 70–99)
Potassium: 4.5 mmol/L (ref 3.5–5.1)
Sodium: 135 mmol/L (ref 135–145)

## 2024-09-21 LAB — ECHOCARDIOGRAM COMPLETE
AR max vel: 2.65 cm2
AV Area VTI: 3.09 cm2
AV Area mean vel: 2.81 cm2
AV Mean grad: 2 mmHg
AV Peak grad: 3.9 mmHg
Ao pk vel: 0.99 m/s
Area-P 1/2: 4.15 cm2
Calc EF: 57.1 %
Height: 63.5 in
S' Lateral: 2.7 cm
Single Plane A2C EF: 54.4 %
Single Plane A4C EF: 57.8 %
Weight: 2212.8 [oz_av]

## 2024-09-21 LAB — MAGNESIUM: Magnesium: 2.1 mg/dL (ref 1.7–2.4)

## 2024-09-21 MED ORDER — DRONEDARONE HCL 400 MG PO TABS
400.0000 mg | ORAL_TABLET | Freq: Two times a day (BID) | ORAL | 6 refills | Status: AC
  Filled 2024-09-21: qty 60, 30d supply, fill #0

## 2024-09-21 MED ORDER — OMEPRAZOLE MAGNESIUM 20 MG PO TBEC: 20.0000 mg | DELAYED_RELEASE_TABLET | Freq: Every day | ORAL | Status: AC

## 2024-09-21 NOTE — Discharge Summary (Signed)
 "     ELECTROPHYSIOLOGY DISCHARGE SUMMARY    Patient ID: Nichole Berg,  MRN: [id number], DOB/AGE: 01/07/1946 79 y.o.  Admit date: 09/19/2024 Discharge date: 09/21/2024  Primary Care Physician: Nellene Quita SAUNDERS, [place]  Primary Cardiologist: [name], MD  Electrophysiologist: Dr. Almetta   Primary Discharge Diagnosis:  Persistent atrial fibrillation   Secondary Discharge Diagnosis:  QT prolongation on Tikosyn   Allergies[1]   Procedures This Admission:  1.  Tikosyn  loading attempt 2.  Echocardiogram with EF 70-75%, Mod LAE, mild MR, trivial AI   Brief HPI: Nichole Berg is a 79 y.o. female with a past medical history as noted above.  They were referred to EP for treatment options of atrial fibrillation.  Risks, benefits, and alternatives to Tikosyn  were reviewed with the patient who wished to proceed with admission for loading.  Hospital Course:  The patient was admitted and Tikosyn  was initiated.  Renal function and electrolytes were followed during the hospitalization.  The patient converted chemically and did not require cardioversion. The patients QT prolonged, requiring cessation of Tikosyn  and initiation of Multaq  to start on Friday, 1/16. They were monitored on telemetry up to discharge. On the day of discharge, they were examined by Dr. Almetta  who considered them stable for discharge to home.  Follow-up has been arranged with Dr. Almetta to further discuss long term plans which could include ablation.   Physical Exam: Vitals:   09/20/24 2027 09/21/24 0022 09/21/24 0528 09/21/24 0922  BP: (!) 153/75 (!) 136/57 127/73 123/66  Pulse:  (!) 55 60 60  Resp: 17 16 16 12   Temp: 98.1 F (36.7 C) 97.7 F (36.5 C) (!) 97.4 F (36.3 C) 97.6 F (36.4 C)  TempSrc: Oral Oral Oral Oral  SpO2: 98% 99% 98% 100%  Weight:      Height:        GEN- NAD, A&O x 3. Normal affect.  Lungs- CTAB, Normal effort.  Heart- Regular rate and rhythm. No M/G/R GI- Soft, NT, ND Extremities- No  clubbing, cyanosis, or edema Skin- no rash or lesion  Labs:   Lab Results  Component Value Date   WBC 5.1 08/15/2024   HGB 12.0 08/15/2024   HCT 37.0 08/15/2024   MCV 93 08/15/2024   PLT 279 08/15/2024    Recent Labs  Lab 09/21/24 0526  NA 135  K 4.5  CL 101  CO2 26  BUN 16  CREATININE 0.71  CALCIUM  9.2  GLUCOSE 87    Discharge Medications:  Allergies as of 09/21/2024       Reactions   Iodinated Contrast Media Hives, Itching   Pravastatin     Headache on 40mg  daily dosing. Tolerates 10 and 20mg    Rosuvastatin     rosuvastatin  5mg  3x weekly and daily - headaches and myalgias   Sulfonamide Derivatives Itching, Rash        Medication List     STOP taking these medications    metoprolol  succinate 50 MG 24 hr tablet Commonly known as: TOPROL -XL       TAKE these medications    acetaminophen  500 MG tablet Commonly known as: TYLENOL  Take 500 mg by mouth every 6 (six) hours as needed for moderate pain. What changed:  how much to take when to take this   carboxymethylcellulose 0.5 % Soln Commonly known as: REFRESH PLUS 1-2 drops 3 (three) times daily as needed (dry/irritated eyes.). What changed: when to take this   dronedarone  400 MG tablet Commonly known as: MULTAQ  Take 1 tablet (  400 mg total) by mouth 2 (two) times daily with a meal. Start taking on: September 23, 2024   Eliquis  5 MG Tabs tablet Generic drug: apixaban  TAKE 1 TABLET TWICE DAILY   LUTEIN-ZEAXANTHIN PO Take 1 tablet by mouth in the morning.   naproxen  sodium 220 MG tablet Commonly known as: ALEVE  Take 220 mg by mouth daily as needed (for pain).   omeprazole  20 MG tablet Commonly known as: PriLOSEC  OTC Take 1 tablet (20 mg total) by mouth daily. If you continue using daily doses of Aleve    Vitamin D  50 MCG (2000 UT) tablet Take 2,000 Units by mouth in the morning.   VITAMIN D2 PO Take 1 tablet by mouth daily.        Disposition:  Home with follow up in EP clinic in 3-4  weeks as in AVS. Sooner with recurrent arrhythmia.   Duration of Discharge Encounter:  APP time: 26 minutes  Signed, Ozell Prentice Passey, PA-C  09/21/2024 1:32 PM        [1]  Allergies Allergen Reactions   Iodinated Contrast Media Hives and Itching   Pravastatin      Headache on 40mg  daily dosing. Tolerates 10 and 20mg    Rosuvastatin      rosuvastatin  5mg  3x weekly and daily - headaches and myalgias   Sulfonamide Derivatives Itching and Rash   "

## 2024-09-21 NOTE — Telephone Encounter (Signed)
 Patient Product/process development scientist completed.    The patient is insured through [employer]. Patient has Medicare and is not eligible for a copay card, but may be able to apply for patient assistance or Medicare RX Payment Plan (Patient Must reach out to their plan, if eligible for payment plan), if available.    Ran test claim for Multaq 400 mg and the current 30 day co-pay is $40.00.   This test claim was processed through [facility]- copay amounts may vary at other pharmacies due to pharmacy/plan contracts, or as the patient moves through the different stages of their insurance plan.     Reyes Sharps, CPHT Pharmacy Technician Patient Advocate Specialist Lead [facility] Patient Advocate Team Direct Number: [phone]  Fax: [phone]

## 2024-09-21 NOTE — Progress Notes (Signed)
" °  Her QT remains prolonged/borderline with holding dose last night.   STOP tikosyn .  Long discussion between patient and Dr. Almetta involving options:   Of note, she has not had CTI, but has had a micro-reentrant atrial flutter ablated.   Discussed medication options: Amiodarone - Deferring for now to avoid off target effects Flecainide - though worry about bradycardia given symptoms with sinus brady and blocked PAC's yesterday.  Multaq  - running insurance to make sure it is well covered  Will update echo to confirm she is a reasonable candidate for flecainide or multaq .   Disposition pending insurance coverage and Echo, but likely home today with follow up in place.   Full note pending disposition.  Ozell Jodie Passey, PA-C  09/21/2024 7:24 AM  "

## 2024-09-22 NOTE — Progress Notes (Signed)
 "               Odis Mace D.[facility] [address] [place] [address] Phone: [phone]   Assessment and Plan:     1. Chronic right shoulder pain (Primary) -Chronic with exacerbation, initial sports medicine visit - Recurrent right shoulder pain consistent with subacromial bursitis versus rotator cuff tendinopathy likely flared by side sleeping, physical activity gardening - Patient has had moderate relief with naproxen  500 mg twice daily x 5 days with PPI.  Recommend discontinuing NSAIDs due to concurrent use of Eliquis  - Patient elected for subacromial CSI.  Tolerated well per note below.  Patient at increased risk of bleeding with chronic anticoagulation on Eliquis  - Start HEP for shoulder and rotator cuff  Procedure: Subacromial Injection Side: Right  Risks explained and consent was given verbally. The site was cleaned with alcohol prep. A steroid injection was performed from posterior approach using 2mL of 1% lidocaine  without epinephrine and 1mL of kenalog  40mg /ml. This was well tolerated.  Needle was removed, hemostasis achieved, and post injection instructions were explained.   Pt was advised to call or return to clinic if these symptoms worsen or fail to improve as anticipated.     Pertinent previous records reviewed include none   Follow Up: As needed if no improvement or worsening of symptoms.  Could consider physical therapy   Subjective:   I, Claretha Schimke am a scribe for Dr. Morene Mace  Chief Complaint: right shoulder pain   HPI:   09/23/2024 Patient is a 79 year old female with right shoulder pain. Patient states that the right shoulder started in the neck and shoulder down to the scapular area. Last Tuesday is the date that it started. The pain just kept getting worse and radiating down the arm. Admitted to the hospital to start Ticasin drug. That Doctor stated that she could take Aleve . It is much better now. Did not take  anything this morning. Still having some tenderness in the shoulder area. Just want to have it checked today. Tylenol  does not help at all. On blood thinners so she can't take certain medication.    Relevant Historical Information: Partial tear in rotator cuff in past  Additional pertinent review of systems negative.  Current Medications[1]   Objective:     Vitals:   09/23/24 1411  BP: (!) 140/80  Pulse: 74  SpO2: 97%  Weight: 137 lb (62.1 kg)  Height: 5' 3.5 (1.613 m)      Body mass index is 23.89 kg/m.    Physical Exam:    Gen: Appears well, nad, nontoxic and pleasant Neuro:sensation intact, strength is 5/5, muscle tone wnl Skin: no suspicious lesion or defmority Psych: A&O, appropriate mood and affect  Right shoulder:  No deformity, swelling or muscle wasting No scapular winging FF 180, abd 180, int 0, ext 90 with pain at endrange in all directions NTTP over the [place], clavicle, ac, coracoid, biceps groove, humerus, deltoid, trapezius, cervical spine Positive Neer, Hawkins, empty can Neg  obriens, crossarm, subscap liftoff, speeds Neg ant drawer, sulcus sign, apprehension Negative Spurling's test bilat FROM of neck    Electronically signed by:  Odis Mace D.[facility] 3:01 PM 09/23/24     [1]  Current Outpatient Medications:    acetaminophen  (TYLENOL ) 500 MG tablet, Take 500 mg by mouth every 6 (six) hours as needed for moderate pain. (Patient taking differently: Take 1,300 mg by mouth 2 (two) times  daily.), Disp: , Rfl:    carboxymethylcellulose (REFRESH PLUS) 0.5 % SOLN, 1-2 drops 3 (three) times daily as needed (dry/irritated eyes.). (Patient taking differently: 1-2 drops as needed (dry/irritated eyes.).), Disp: , Rfl:    Cholecalciferol (VITAMIN D ) 2000 UNITS tablet, Take 2,000 Units by mouth in the morning., Disp: , Rfl:    dronedarone  (MULTAQ ) 400 MG tablet, Take 1 tablet (400 mg total) by mouth 2 (two) times daily with a meal.,  Disp: 60 tablet, Rfl: 6   ELIQUIS  5 MG TABS tablet, TAKE 1 TABLET TWICE DAILY, Disp: 180 tablet, Rfl: 3   Ergocalciferol  (VITAMIN D2 PO), Take 1 tablet by mouth daily., Disp: , Rfl:    Multiple Vitamins-Minerals (LUTEIN-ZEAXANTHIN PO), Take 1 tablet by mouth in the morning., Disp: , Rfl:    naproxen  sodium (ALEVE ) 220 MG tablet, Take 220 mg by mouth daily as needed (for pain)., Disp: , Rfl:    omeprazole  (PRILOSEC  OTC) 20 MG tablet, Take 1 tablet (20 mg total) by mouth daily. If you continue using daily doses of Aleve , Disp: , Rfl:   "

## 2024-09-23 ENCOUNTER — Ambulatory Visit: Admitting: Sports Medicine

## 2024-09-23 VITALS — BP 140/80 | HR 74 | Ht 63.5 in | Wt 137.0 lb

## 2024-09-23 DIAGNOSIS — G8929 Other chronic pain: Secondary | ICD-10-CM | POA: Diagnosis not present

## 2024-09-23 DIAGNOSIS — M25511 Pain in right shoulder: Secondary | ICD-10-CM | POA: Diagnosis not present

## 2024-09-23 NOTE — Patient Instructions (Addendum)
 Injected shoulder today.  Shoulder HEP. - Use Tylenol  500 to 1000 mg tablets 2-3 times a day for day-to-day pain relief Follow up as needed. If no improvement return in 4 weeks.

## 2024-10-03 LAB — COLOGUARD: COLOGUARD: NEGATIVE

## 2024-10-04 ENCOUNTER — Ambulatory Visit: Payer: Self-pay | Admitting: Internal Medicine

## 2024-10-04 NOTE — Progress Notes (Signed)
 Negative  screen!

## 2024-10-12 ENCOUNTER — Ambulatory Visit

## 2024-10-12 ENCOUNTER — Encounter: Payer: Self-pay | Admitting: Family Medicine

## 2024-10-12 ENCOUNTER — Ambulatory Visit: Admitting: Family Medicine

## 2024-10-12 VITALS — BP 128/70 | HR 84 | Ht 63.5 in | Wt 137.4 lb

## 2024-10-12 DIAGNOSIS — M25511 Pain in right shoulder: Secondary | ICD-10-CM | POA: Diagnosis not present

## 2024-10-12 DIAGNOSIS — M542 Cervicalgia: Secondary | ICD-10-CM

## 2024-10-12 DIAGNOSIS — M47812 Spondylosis without myelopathy or radiculopathy, cervical region: Secondary | ICD-10-CM

## 2024-10-12 NOTE — Patient Instructions (Addendum)
 Good to see you. X-ray on the way out. Trigger point injections. Keep doing the exercises but avoid extension. See me again in 6 to 8 weeks.

## 2024-10-12 NOTE — Assessment & Plan Note (Signed)
 Concerned that this is playing a role.  X-rays ordered today.  No radicular symptoms will need to be monitoring.  Worsening pain advanced imaging.  Hopeful that patient will continue to make improvement.  Discussed icing regimen and home exercises.  Follow-up again in 6 to 12 weeks.

## 2024-10-12 NOTE — Assessment & Plan Note (Signed)
 Injections given today, tolerated the procedure well, discussed icing regimen and home exercises, which activities to do and which ones to avoid.  Increase activity slowly.  Discussed icing regimen.  Follow-up again in 6 to 12 weeks.

## 2024-10-14 ENCOUNTER — Ambulatory Visit: Payer: Self-pay | Admitting: Family Medicine

## 2024-10-24 ENCOUNTER — Ambulatory Visit: Admitting: Student

## 2024-10-31 ENCOUNTER — Ambulatory Visit: Admitting: Family Medicine

## 2024-11-28 ENCOUNTER — Ambulatory Visit: Admitting: Family Medicine

## 2025-09-19 ENCOUNTER — Encounter: Admitting: Internal Medicine
